# Patient Record
Sex: Male | Born: 1935 | Race: White | Hispanic: No | Smoking: Never smoker
Health system: Southern US, Community
[De-identification: ages and names within clinical notes are randomized; demographics above are authoritative.]

## PROBLEM LIST (undated history)

## (undated) DIAGNOSIS — E119 Type 2 diabetes mellitus without complications: Secondary | ICD-10-CM

## (undated) DIAGNOSIS — R0600 Dyspnea, unspecified: Secondary | ICD-10-CM

## (undated) DIAGNOSIS — I739 Peripheral vascular disease, unspecified: Secondary | ICD-10-CM

## (undated) DIAGNOSIS — E785 Hyperlipidemia, unspecified: Secondary | ICD-10-CM

## (undated) DIAGNOSIS — Z8739 Personal history of other diseases of the musculoskeletal system and connective tissue: Secondary | ICD-10-CM

## (undated) DIAGNOSIS — I2510 Atherosclerotic heart disease of native coronary artery without angina pectoris: Secondary | ICD-10-CM

## (undated) DIAGNOSIS — I209 Angina pectoris, unspecified: Secondary | ICD-10-CM

## (undated) DIAGNOSIS — I219 Acute myocardial infarction, unspecified: Secondary | ICD-10-CM

## (undated) DIAGNOSIS — K219 Gastro-esophageal reflux disease without esophagitis: Secondary | ICD-10-CM

## (undated) DIAGNOSIS — N183 Chronic kidney disease, stage 3 unspecified: Secondary | ICD-10-CM

## (undated) DIAGNOSIS — I10 Essential (primary) hypertension: Secondary | ICD-10-CM

---

## 2005-07-08 ENCOUNTER — Ambulatory Visit: Payer: Self-pay

## 2005-07-27 ENCOUNTER — Other Ambulatory Visit: Payer: Self-pay

## 2005-07-28 ENCOUNTER — Inpatient Hospital Stay: Payer: Self-pay | Admitting: Orthopaedic Surgery

## 2005-09-25 ENCOUNTER — Other Ambulatory Visit: Payer: Self-pay

## 2005-09-25 ENCOUNTER — Ambulatory Visit: Payer: Self-pay | Admitting: General Practice

## 2005-10-01 ENCOUNTER — Ambulatory Visit: Payer: Self-pay | Admitting: General Practice

## 2005-11-19 ENCOUNTER — Ambulatory Visit: Payer: Self-pay | Admitting: General Practice

## 2005-11-21 ENCOUNTER — Ambulatory Visit: Payer: Self-pay | Admitting: General Practice

## 2006-06-17 ENCOUNTER — Other Ambulatory Visit: Payer: Self-pay

## 2006-06-17 ENCOUNTER — Ambulatory Visit: Payer: Self-pay | Admitting: Urology

## 2006-06-24 ENCOUNTER — Ambulatory Visit: Payer: Self-pay | Admitting: Urology

## 2006-09-09 ENCOUNTER — Ambulatory Visit: Payer: Self-pay | Admitting: Urology

## 2006-12-09 ENCOUNTER — Other Ambulatory Visit: Payer: Self-pay

## 2006-12-09 ENCOUNTER — Ambulatory Visit: Payer: Self-pay | Admitting: Urology

## 2006-12-24 ENCOUNTER — Inpatient Hospital Stay: Payer: Self-pay | Admitting: Urology

## 2007-05-24 ENCOUNTER — Inpatient Hospital Stay: Payer: Self-pay | Admitting: Unknown Physician Specialty

## 2007-05-24 ENCOUNTER — Other Ambulatory Visit: Payer: Self-pay

## 2007-09-10 ENCOUNTER — Other Ambulatory Visit: Payer: Self-pay

## 2007-09-11 ENCOUNTER — Inpatient Hospital Stay: Payer: Self-pay | Admitting: Unknown Physician Specialty

## 2008-02-22 ENCOUNTER — Ambulatory Visit: Payer: Self-pay | Admitting: General Practice

## 2008-03-06 ENCOUNTER — Inpatient Hospital Stay: Payer: Self-pay | Admitting: General Practice

## 2008-09-10 IMAGING — CR DG ABDOMEN 2V
1 series · 2 of 2 positions shown · non-contrast
Comparison: none

REASON FOR EXAM: Abdominal distention
COMMENTS:

[Series 1: view not recorded · 0.17mm/px · 2 of 2 slices shown]
[im 1/2]
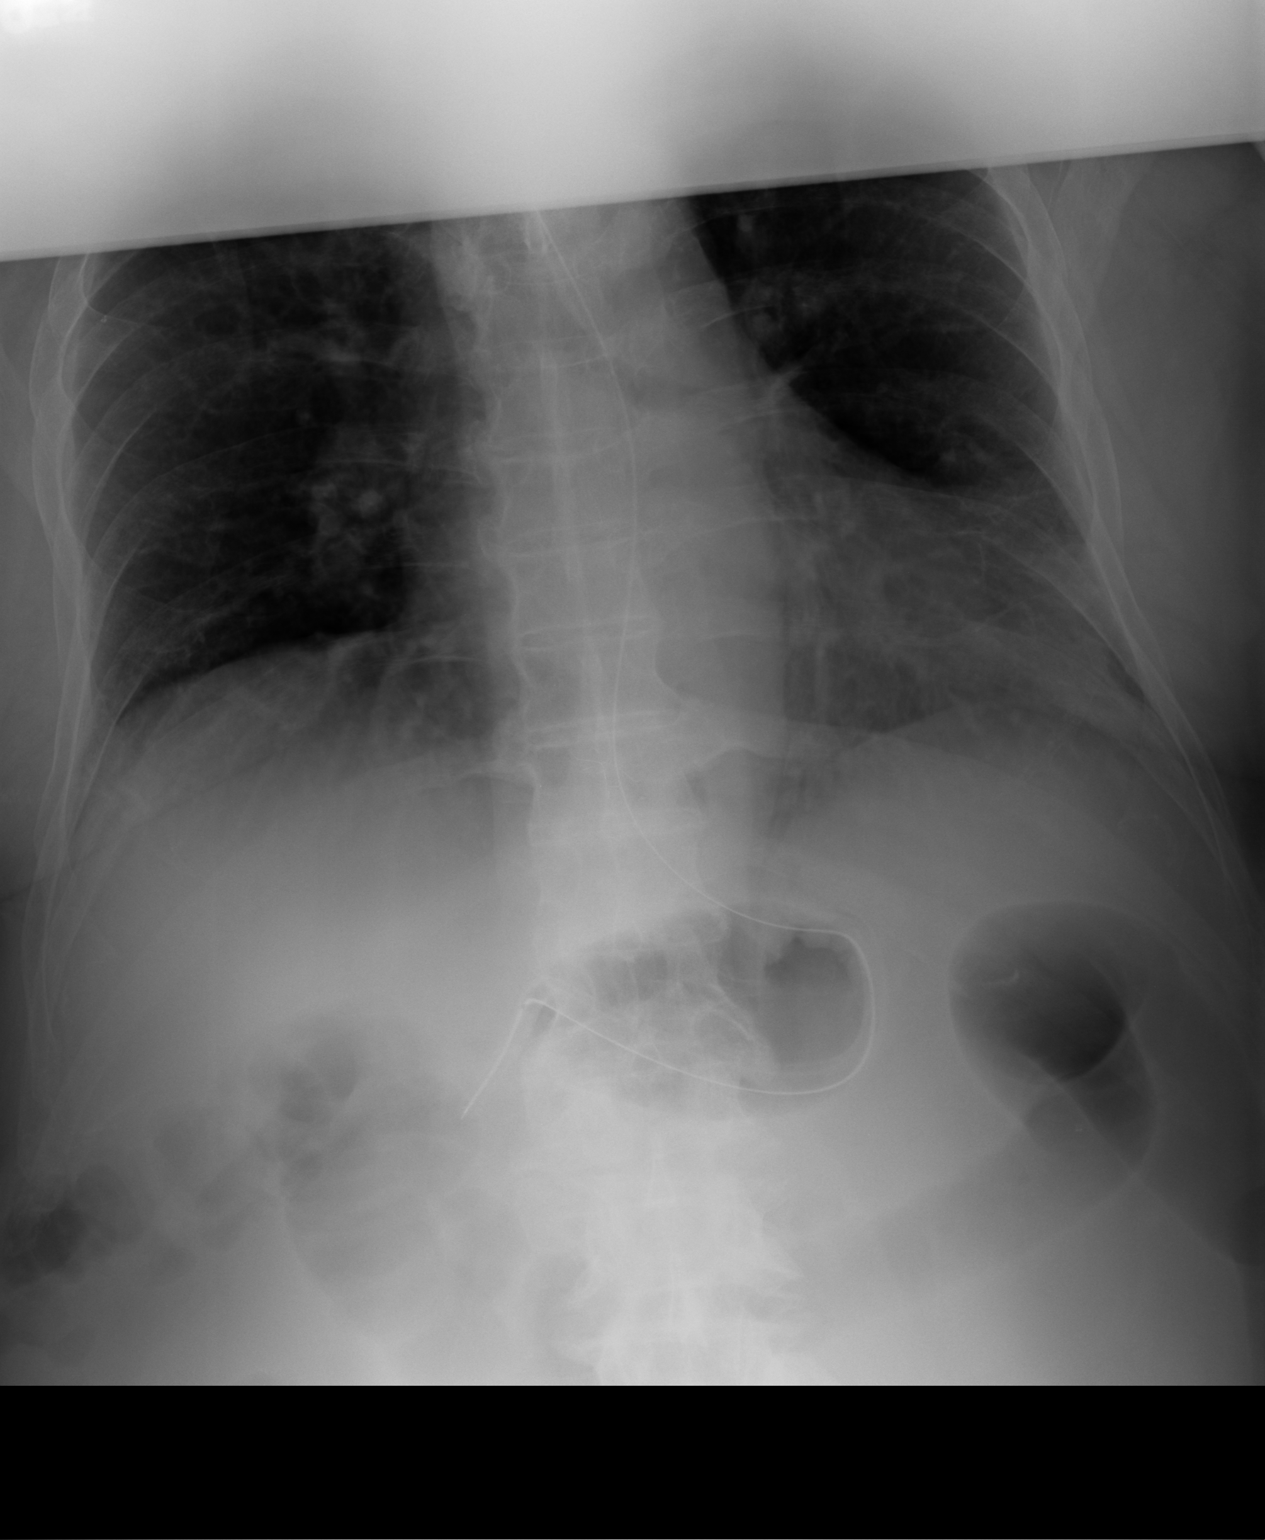
[im 2/2]
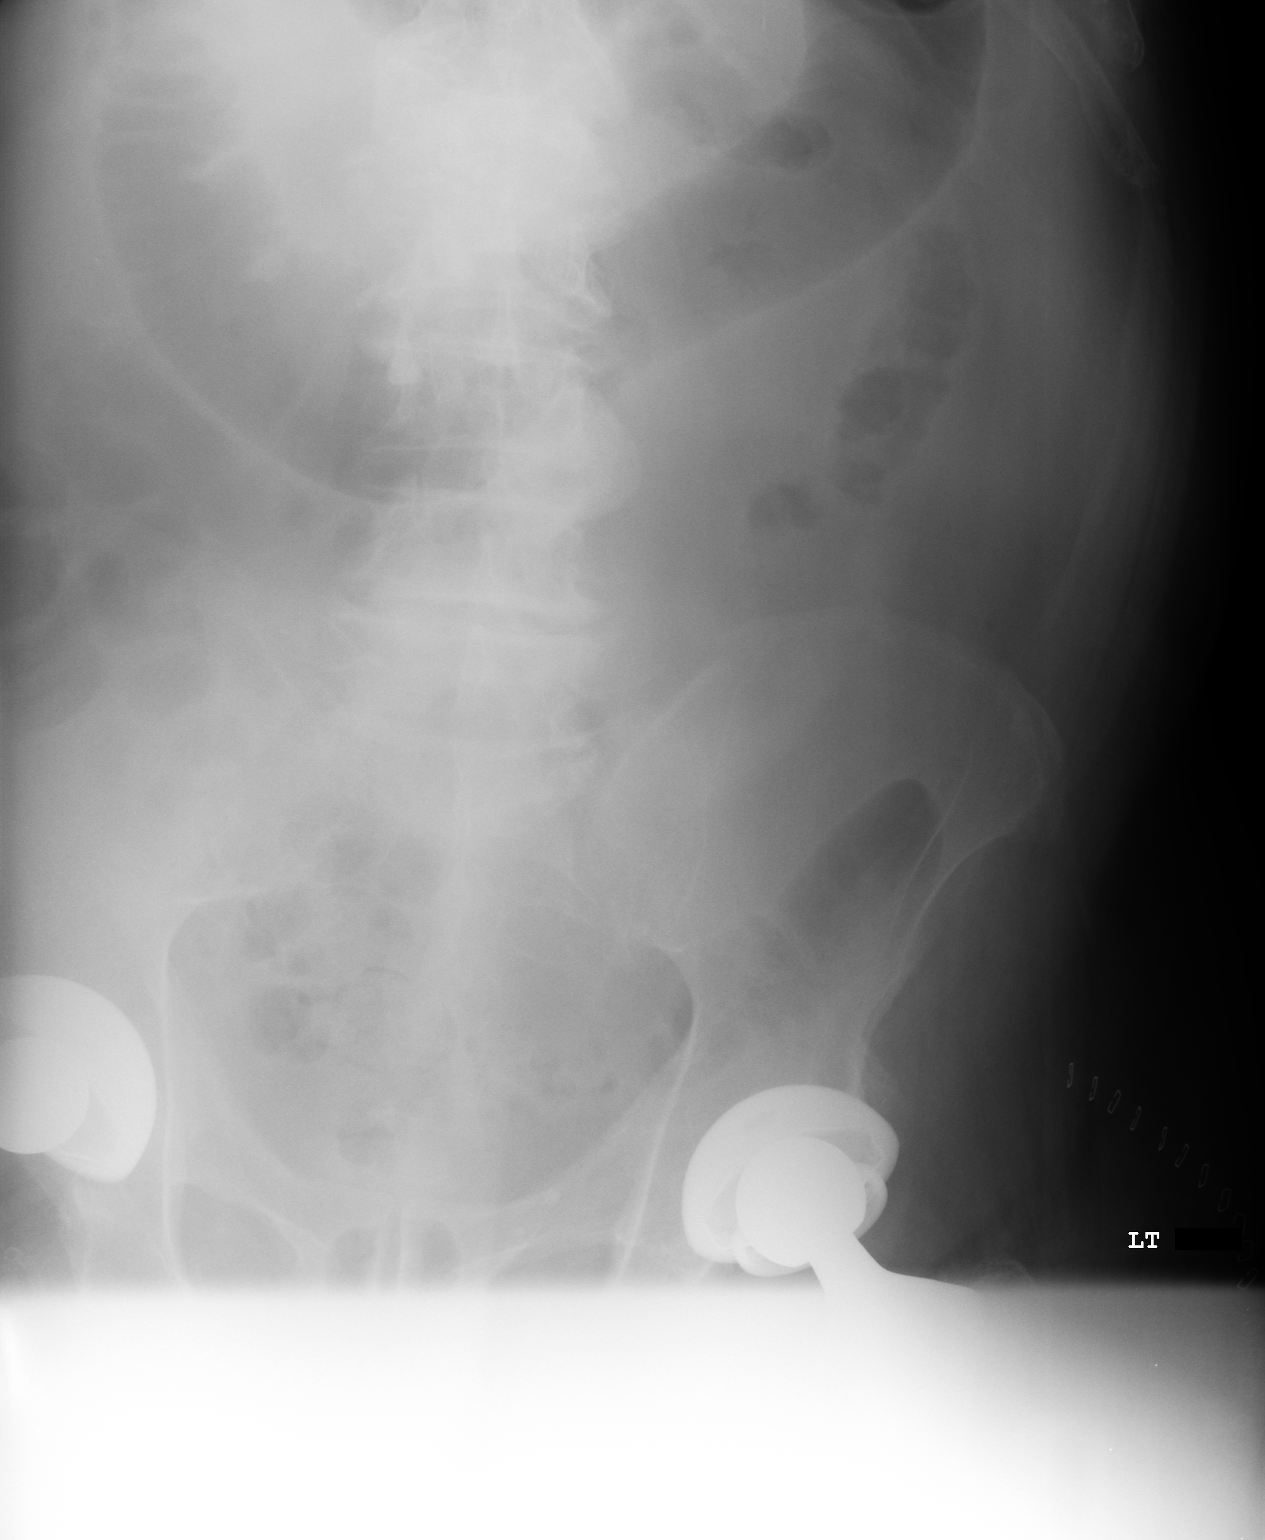

[2 of 2 positions shown; findings below may reference images not displayed]

PROCEDURE:     DXR - DXR ABDOMEN 2 V FLAT AND ERECT  - March 09, 2008  [DATE]

RESULT:     Comparison is made to abdominal films performed on 03/07/2008.

An esophagogastric tube is evident. The tip appears to be in the pyloric
region or first portion of the duodenum.  There is air within loops of small
bowel with mild distention. There is air within the colon without
significant distention. The lung bases are clear. Bilateral hip arthroplasty
changes are noted. There is no significant abnormal bowel distention
suggested.
IMPRESSION: Esophagogastric tube placed.

## 2013-02-17 LAB — BASIC METABOLIC PANEL
Anion Gap: 9 (ref 7–16)
Calcium, Total: 9.3 mg/dL (ref 8.5–10.1)
Chloride: 104 mmol/L (ref 98–107)
Creatinine: 1.41 mg/dL — ABNORMAL HIGH (ref 0.60–1.30)
Glucose: 91 mg/dL (ref 65–99)
Osmolality: 280 (ref 275–301)
Potassium: 5 mmol/L (ref 3.5–5.1)
Sodium: 139 mmol/L (ref 136–145)

## 2013-02-17 LAB — CBC
MCH: 32.1 pg (ref 26.0–34.0)
MCHC: 33.6 g/dL (ref 32.0–36.0)
MCV: 96 fL (ref 80–100)
Platelet: 185 10*3/uL (ref 150–440)
RDW: 13.9 % (ref 11.5–14.5)

## 2013-02-17 LAB — URINALYSIS, COMPLETE
Bilirubin,UR: NEGATIVE
Blood: NEGATIVE
Glucose,UR: NEGATIVE mg/dL (ref 0–75)
Nitrite: NEGATIVE
Ph: 6 (ref 4.5–8.0)
Protein: NEGATIVE
RBC,UR: 3 /HPF (ref 0–5)
Specific Gravity: 1.013 (ref 1.003–1.030)
Squamous Epithelial: <1
WBC UR: 5 /HPF (ref 0–5)

## 2013-02-18 ENCOUNTER — Inpatient Hospital Stay: Payer: Self-pay | Admitting: Internal Medicine

## 2013-02-19 ENCOUNTER — Ambulatory Visit: Payer: Self-pay | Admitting: Orthopedic Surgery

## 2013-02-22 ENCOUNTER — Encounter: Payer: Self-pay | Admitting: Internal Medicine

## 2013-03-02 LAB — URINALYSIS, COMPLETE
Glucose,UR: NEGATIVE mg/dL (ref 0–75)
Ketone: NEGATIVE
RBC,UR: 6 /HPF (ref 0–5)
Squamous Epithelial: 1
WBC UR: 338 /HPF (ref 0–5)

## 2013-03-04 LAB — URINE CULTURE

## 2013-03-08 ENCOUNTER — Encounter: Payer: Self-pay | Admitting: Internal Medicine

## 2013-03-14 ENCOUNTER — Ambulatory Visit: Payer: Self-pay | Admitting: Gerontology

## 2013-03-14 LAB — CBC WITH DIFFERENTIAL/PLATELET
Basophil #: 0 10*3/uL (ref 0.0–0.1)
Eosinophil #: 0.1 10*3/uL (ref 0.0–0.7)
Eosinophil %: 0.5 %
HCT: 35.5 % — ABNORMAL LOW (ref 40.0–52.0)
HGB: 11.9 g/dL — ABNORMAL LOW (ref 13.0–18.0)
Lymphocyte #: 0.7 10*3/uL — ABNORMAL LOW (ref 1.0–3.6)
Lymphocyte %: 4.3 %
MCV: 96 fL (ref 80–100)
Monocyte #: 0.7 x10 3/mm (ref 0.2–1.0)
Monocyte %: 4.5 %
Neutrophil %: 90.4 %

## 2013-03-14 LAB — COMPREHENSIVE METABOLIC PANEL
Albumin: 3.3 g/dL — ABNORMAL LOW (ref 3.4–5.0)
Alkaline Phosphatase: 59 U/L (ref 50–136)
Anion Gap: 5 — ABNORMAL LOW (ref 7–16)
Bilirubin,Total: 0.3 mg/dL (ref 0.2–1.0)
Calcium, Total: 8.8 mg/dL (ref 8.5–10.1)
Chloride: 106 mmol/L (ref 98–107)
Creatinine: 2.44 mg/dL — ABNORMAL HIGH (ref 0.60–1.30)
EGFR (Non-African Amer.): 22 — ABNORMAL LOW
Glucose: 142 mg/dL — ABNORMAL HIGH (ref 65–99)
Osmolality: 284 (ref 275–301)
Potassium: 6.4 mmol/L — ABNORMAL HIGH (ref 3.5–5.1)
SGPT (ALT): 20 U/L (ref 12–78)
Sodium: 136 mmol/L (ref 136–145)

## 2013-03-14 LAB — MAGNESIUM: Magnesium: 1.8 mg/dL

## 2013-03-15 LAB — BASIC METABOLIC PANEL
Anion Gap: 5 — ABNORMAL LOW (ref 7–16)
BUN: 31 mg/dL — ABNORMAL HIGH (ref 7–18)
Co2: 21 mmol/L (ref 21–32)
Creatinine: 1.33 mg/dL — ABNORMAL HIGH (ref 0.60–1.30)
EGFR (Non-African Amer.): 46 — ABNORMAL LOW
Osmolality: 280 (ref 275–301)
Potassium: 6.3 mmol/L — ABNORMAL HIGH (ref 3.5–5.1)

## 2013-03-16 LAB — BASIC METABOLIC PANEL
Anion Gap: 8 (ref 7–16)
Calcium, Total: 8.7 mg/dL (ref 8.5–10.1)
Co2: 25 mmol/L (ref 21–32)
EGFR (African American): >60
EGFR (Non-African Amer.): 53 — ABNORMAL LOW
Glucose: 107 mg/dL — ABNORMAL HIGH (ref 65–99)
Sodium: 142 mmol/L (ref 136–145)

## 2013-03-16 LAB — FOLATE: Folic Acid: 15.6 ng/mL (ref 3.1–100.0)

## 2013-03-18 LAB — BASIC METABOLIC PANEL
Anion Gap: 8 (ref 7–16)
Calcium, Total: 8.8 mg/dL (ref 8.5–10.1)
Chloride: 107 mmol/L (ref 98–107)
EGFR (African American): >60
EGFR (Non-African Amer.): >60

## 2013-03-22 LAB — BASIC METABOLIC PANEL
Anion Gap: 7 (ref 7–16)
Calcium, Total: 9.1 mg/dL (ref 8.5–10.1)
Creatinine: 1.59 mg/dL — ABNORMAL HIGH (ref 0.60–1.30)
EGFR (African American): 43 — ABNORMAL LOW
EGFR (Non-African Amer.): 37 — ABNORMAL LOW
Glucose: 167 mg/dL — ABNORMAL HIGH (ref 65–99)
Potassium: 4 mmol/L (ref 3.5–5.1)

## 2013-04-08 ENCOUNTER — Encounter: Payer: Self-pay | Admitting: Internal Medicine

## 2013-10-02 LAB — COMPREHENSIVE METABOLIC PANEL
ALT: 15 U/L (ref 12–78)
Albumin: 3.3 g/dL — ABNORMAL LOW (ref 3.4–5.0)
Alkaline Phosphatase: 61 U/L
Anion Gap: 4 — ABNORMAL LOW (ref 7–16)
BILIRUBIN TOTAL: 0.2 mg/dL (ref 0.2–1.0)
BUN: 24 mg/dL — AB (ref 7–18)
CALCIUM: 8.4 mg/dL — AB (ref 8.5–10.1)
Chloride: 108 mmol/L — ABNORMAL HIGH (ref 98–107)
Co2: 26 mmol/L (ref 21–32)
Creatinine: 1.4 mg/dL — ABNORMAL HIGH (ref 0.60–1.30)
EGFR (African American): 50 — ABNORMAL LOW
GFR CALC NON AF AMER: 43 — AB
GLUCOSE: 128 mg/dL — AB (ref 65–99)
Osmolality: 281 (ref 275–301)
Potassium: 4 mmol/L (ref 3.5–5.1)
SGOT(AST): 17 U/L (ref 15–37)
Sodium: 138 mmol/L (ref 136–145)
Total Protein: 7.2 g/dL (ref 6.4–8.2)

## 2013-10-02 LAB — CBC
HCT: 37.9 % — AB (ref 40.0–52.0)
HGB: 12.3 g/dL — ABNORMAL LOW (ref 13.0–18.0)
MCH: 30.1 pg (ref 26.0–34.0)
MCHC: 32.5 g/dL (ref 32.0–36.0)
MCV: 93 fL (ref 80–100)
PLATELETS: 172 10*3/uL (ref 150–440)
RBC: 4.09 10*6/uL — ABNORMAL LOW (ref 4.40–5.90)
RDW: 14.9 % — AB (ref 11.5–14.5)
WBC: 9.5 10*3/uL (ref 3.8–10.6)

## 2013-10-02 LAB — TROPONIN I

## 2013-10-03 ENCOUNTER — Inpatient Hospital Stay: Payer: Self-pay | Admitting: Internal Medicine

## 2013-10-03 LAB — URINALYSIS, COMPLETE
Bilirubin,UR: NEGATIVE
GLUCOSE, UR: NEGATIVE mg/dL (ref 0–75)
Ketone: NEGATIVE
Nitrite: NEGATIVE
PH: 5 (ref 4.5–8.0)
Protein: 30
RBC,UR: 9 /HPF (ref 0–5)
SPECIFIC GRAVITY: 1.019 (ref 1.003–1.030)

## 2013-10-04 LAB — CBC WITH DIFFERENTIAL/PLATELET
BASOS ABS: 0 10*3/uL (ref 0.0–0.1)
BASOS ABS: 0 10*3/uL (ref 0.0–0.1)
BASOS PCT: 0.3 %
BASOS PCT: 0.5 %
EOS PCT: 2.4 %
Eosinophil #: 0.2 10*3/uL (ref 0.0–0.7)
Eosinophil #: 0.2 10*3/uL (ref 0.0–0.7)
Eosinophil %: 2.1 %
HCT: 38.1 % — ABNORMAL LOW (ref 40.0–52.0)
HCT: 36.4 % — AB (ref 40.0–52.0)
HGB: 12.3 g/dL — AB (ref 13.0–18.0)
HGB: 11.9 g/dL — ABNORMAL LOW (ref 13.0–18.0)
LYMPHS PCT: 11 %
Lymphocyte #: 0.6 10*3/uL — ABNORMAL LOW (ref 1.0–3.6)
Lymphocyte #: 1 10*3/uL (ref 1.0–3.6)
Lymphocyte %: 5.9 %
MCH: 30 pg (ref 26.0–34.0)
MCH: 30.6 pg (ref 26.0–34.0)
MCHC: 32.2 g/dL (ref 32.0–36.0)
MCHC: 32.8 g/dL (ref 32.0–36.0)
MCV: 93 fL (ref 80–100)
MCV: 93 fL (ref 80–100)
MONO ABS: 0.7 x10 3/mm (ref 0.2–1.0)
MONO ABS: 0.6 x10 3/mm (ref 0.2–1.0)
Monocyte %: 6.5 %
Monocyte %: 6.3 %
NEUTROS ABS: 9.3 10*3/uL — AB (ref 1.4–6.5)
NEUTROS ABS: 7.1 10*3/uL — AB (ref 1.4–6.5)
Neutrophil %: 85.2 %
Neutrophil %: 79.8 %
PLATELETS: 157 10*3/uL (ref 150–440)
Platelet: 144 10*3/uL — ABNORMAL LOW (ref 150–440)
RBC: 4.08 10*6/uL — ABNORMAL LOW (ref 4.40–5.90)
RBC: 3.9 10*6/uL — AB (ref 4.40–5.90)
RDW: 15.2 % — AB (ref 11.5–14.5)
RDW: 15.2 % — AB (ref 11.5–14.5)
WBC: 10.9 10*3/uL — ABNORMAL HIGH (ref 3.8–10.6)
WBC: 8.9 10*3/uL (ref 3.8–10.6)

## 2013-10-04 LAB — BASIC METABOLIC PANEL
ANION GAP: 4 — AB (ref 7–16)
BUN: 24 mg/dL — ABNORMAL HIGH (ref 7–18)
CALCIUM: 8.4 mg/dL — AB (ref 8.5–10.1)
CREATININE: 1.47 mg/dL — AB (ref 0.60–1.30)
Chloride: 105 mmol/L (ref 98–107)
Co2: 24 mmol/L (ref 21–32)
EGFR (Non-African Amer.): 41 — ABNORMAL LOW
GFR CALC AF AMER: 47 — AB
Glucose: 163 mg/dL — ABNORMAL HIGH (ref 65–99)
Osmolality: 274 (ref 275–301)
Potassium: 4.5 mmol/L (ref 3.5–5.1)
Sodium: 133 mmol/L — ABNORMAL LOW (ref 136–145)

## 2013-10-05 LAB — BASIC METABOLIC PANEL
ANION GAP: 6 — AB (ref 7–16)
BUN: 16 mg/dL (ref 7–18)
CO2: 26 mmol/L (ref 21–32)
Calcium, Total: 8.2 mg/dL — ABNORMAL LOW (ref 8.5–10.1)
Chloride: 106 mmol/L (ref 98–107)
Creatinine: 1.07 mg/dL (ref 0.60–1.30)
EGFR (Non-African Amer.): 60 — ABNORMAL LOW
GLUCOSE: 106 mg/dL — AB (ref 65–99)
Osmolality: 277 (ref 275–301)
Potassium: 4.4 mmol/L (ref 3.5–5.1)
SODIUM: 138 mmol/L (ref 136–145)

## 2013-10-05 LAB — CBC WITH DIFFERENTIAL/PLATELET
Basophil #: 0 10*3/uL (ref 0.0–0.1)
Basophil %: 0.6 %
EOS ABS: 0.4 10*3/uL (ref 0.0–0.7)
EOS PCT: 5.7 %
HCT: 33.7 % — ABNORMAL LOW (ref 40.0–52.0)
HGB: 11.1 g/dL — ABNORMAL LOW (ref 13.0–18.0)
Lymphocyte #: 0.8 10*3/uL — ABNORMAL LOW (ref 1.0–3.6)
Lymphocyte %: 11.5 %
MCH: 30.5 pg (ref 26.0–34.0)
MCHC: 33 g/dL (ref 32.0–36.0)
MCV: 93 fL (ref 80–100)
MONOS PCT: 10.3 %
Monocyte #: 0.7 x10 3/mm (ref 0.2–1.0)
Neutrophil #: 5.2 10*3/uL (ref 1.4–6.5)
Neutrophil %: 71.9 %
PLATELETS: 135 10*3/uL — AB (ref 150–440)
RBC: 3.64 10*6/uL — AB (ref 4.40–5.90)
RDW: 15.2 % — AB (ref 11.5–14.5)
WBC: 7.3 10*3/uL (ref 3.8–10.6)

## 2013-10-05 LAB — URINE CULTURE

## 2013-10-10 ENCOUNTER — Telehealth: Payer: Self-pay | Admitting: Family Medicine

## 2013-10-10 ENCOUNTER — Emergency Department: Payer: Self-pay | Admitting: Emergency Medicine

## 2013-10-10 LAB — CBC
HCT: 35.7 % — AB (ref 40.0–52.0)
HGB: 11.7 g/dL — ABNORMAL LOW (ref 13.0–18.0)
MCH: 30.2 pg (ref 26.0–34.0)
MCHC: 32.8 g/dL (ref 32.0–36.0)
MCV: 92 fL (ref 80–100)
PLATELETS: 198 10*3/uL (ref 150–440)
RBC: 3.88 10*6/uL — ABNORMAL LOW (ref 4.40–5.90)
RDW: 15.6 % — AB (ref 11.5–14.5)
WBC: 7.8 10*3/uL (ref 3.8–10.6)

## 2013-10-10 LAB — TROPONIN I: Troponin-I: <0.02 ng/mL

## 2013-10-10 LAB — BASIC METABOLIC PANEL
Anion Gap: 8 (ref 7–16)
BUN: 26 mg/dL — ABNORMAL HIGH (ref 7–18)
CO2: 26 mmol/L (ref 21–32)
Calcium, Total: 8.9 mg/dL (ref 8.5–10.1)
Chloride: 100 mmol/L (ref 98–107)
Creatinine: 1.46 mg/dL — ABNORMAL HIGH (ref 0.60–1.30)
EGFR (African American): 48 — ABNORMAL LOW
EGFR (Non-African Amer.): 41 — ABNORMAL LOW
Glucose: 138 mg/dL — ABNORMAL HIGH (ref 65–99)
OSMOLALITY: 275 (ref 275–301)
Potassium: 4.1 mmol/L (ref 3.5–5.1)
Sodium: 134 mmol/L — ABNORMAL LOW (ref 136–145)

## 2013-10-10 NOTE — Telephone Encounter (Signed)
noted 

## 2013-10-10 NOTE — Telephone Encounter (Signed)
[facility] Triage Call Report Triage Record Num: [id number] Operator: Judeen Hammansracie Rowe Patient Name: Timothy QuintOscar Hays Call Date & Time: 10/10/2013 2:53:59AM Patient Phone: [phone] PCP: Tillman Abideichard Letvak Patient Gender: Male PCP Fax : [phone] Patient DOB: 06/26/1919 [facility] Name[facility] [place] Reason for Call: Caller: Kathy/LPN; PCP: Tillman AbideLetvak , Richard (Family Practice); CB#: [phone]; Call regarding Chest pain; Onset 2/2. Has been present x 5 or more minutes and is present regardless of activity. All emergent sxs ruled out per Chest Pain Guideline except for "Pressure, fullness, squeezing sensation or pain anywhere in the chest lasting 5 or more minutes now or within the last hour...." Advised to hang up and call EMS per disposition. LPN agrees. Protocol(s) Used: Chest Pain Recommended Outcome per Protocol: Activate EMS 911 Reason for Outcome: Pressure, fullness, squeezing sensation or pain anywhere in the chest lasting 5 or more minutes now or within the last hour. Pain is NOT associated with taking a deep breath or a productive cough, movement, or touch to a localized area on the chest. Care Advice: ~ 10/10/2013 2:58:39AM Page 1 of 1 [id number]

## 2013-10-12 DIAGNOSIS — I10 Essential (primary) hypertension: Secondary | ICD-10-CM

## 2013-10-12 DIAGNOSIS — Z7982 Long term (current) use of aspirin: Secondary | ICD-10-CM

## 2013-10-12 DIAGNOSIS — N390 Urinary tract infection, site not specified: Secondary | ICD-10-CM

## 2013-10-12 DIAGNOSIS — E119 Type 2 diabetes mellitus without complications: Secondary | ICD-10-CM

## 2013-10-12 DIAGNOSIS — M25559 Pain in unspecified hip: Secondary | ICD-10-CM

## 2013-11-08 DIAGNOSIS — R609 Edema, unspecified: Secondary | ICD-10-CM

## 2013-11-08 DIAGNOSIS — I739 Peripheral vascular disease, unspecified: Secondary | ICD-10-CM

## 2013-11-22 ENCOUNTER — Emergency Department: Payer: Self-pay | Admitting: Emergency Medicine

## 2014-02-06 ENCOUNTER — Ambulatory Visit: Payer: Self-pay | Admitting: Family Medicine

## 2014-10-21 ENCOUNTER — Emergency Department: Payer: Self-pay | Admitting: Internal Medicine

## 2014-10-26 ENCOUNTER — Inpatient Hospital Stay: Payer: Self-pay | Admitting: Internal Medicine

## 2014-10-26 DIAGNOSIS — R079 Chest pain, unspecified: Secondary | ICD-10-CM

## 2014-10-31 DIAGNOSIS — S92344A Nondisplaced fracture of fourth metatarsal bone, right foot, initial encounter for closed fracture: Secondary | ICD-10-CM

## 2014-10-31 DIAGNOSIS — I2510 Atherosclerotic heart disease of native coronary artery without angina pectoris: Secondary | ICD-10-CM

## 2014-10-31 DIAGNOSIS — S92345A Nondisplaced fracture of fourth metatarsal bone, left foot, initial encounter for closed fracture: Secondary | ICD-10-CM

## 2014-10-31 DIAGNOSIS — E119 Type 2 diabetes mellitus without complications: Secondary | ICD-10-CM

## 2014-10-31 DIAGNOSIS — K209 Esophagitis, unspecified: Secondary | ICD-10-CM

## 2014-10-31 DIAGNOSIS — I10 Essential (primary) hypertension: Secondary | ICD-10-CM

## 2014-11-20 DIAGNOSIS — J40 Bronchitis, not specified as acute or chronic: Secondary | ICD-10-CM | POA: Diagnosis not present

## 2014-11-20 DIAGNOSIS — K2210 Ulcer of esophagus without bleeding: Secondary | ICD-10-CM

## 2014-11-20 DIAGNOSIS — I10 Essential (primary) hypertension: Secondary | ICD-10-CM

## 2014-11-20 DIAGNOSIS — E119 Type 2 diabetes mellitus without complications: Secondary | ICD-10-CM

## 2014-11-20 DIAGNOSIS — I2510 Atherosclerotic heart disease of native coronary artery without angina pectoris: Secondary | ICD-10-CM

## 2014-12-29 NOTE — Consult Note (Signed)
PATIENT NAME:  Hays, [name] MR#:  [id number] DATE OF BIRTH:  May 27, 1919  DATE OF CONSULTATION:  02/19/2013  REFERRING PHCarolina CellarYSICIAN:   CONSULTING PHYSICIAN:  Danelle Earthlyobin T. Shawntrice Salle, MD  HISTORY OF PRESENT ILLNESS:  Mr. Timothy Hays is an otherwise relatively healthy 81 year old male with past history of bilateral total hip replacements as well as coronary artery disease and gout who was admitted to the hospital yesterday after sustaining a fall Wednesday with right thoracic pain as well as left thigh pain and inability to bear weight on his left lower extremity.  His initial radiographs of the pelvis and left femur showed a stable bilateral total hip replacements without any evidence of fracture-dislocation.  Given his inability to bear weight on the left side a CT scan was ordered and orthopedics was consulted.  Upon talking to Mr. Timothy Hays, he states he has had what he describes as occasional intermittent flinching in his left lower extremity with ambulation over the past couple of months, he says was acutely worsened after his fall a few days ago where he now has pain in his thigh with attempted walking and now is unable to bear weight.   PAST MEDICAL HISTORY:  As above.   PHYSICAL EXAMINATION:  The patient is alert and oriented, appropriately responsive in bed.  His right lower extremity is painless to knee flexion-extension as well as internal/external rotation of his hip.  He did have some tenderness with lateral palpation of his mid femur and states that that is where he gets pain when he attempts to ambulate.   RADIOGRAPHS:  X-rays again demonstrate stable femoral prosthesis without any evidence of periprosthetic fracture.  CT scan was performed with significant metal artifact making it difficult to interpret, but seen on a series of slices on the axial images of what appears to be a posterior cortical irregularity which again could be representative of a periprosthetic fracture as well as a potential for osteoid  osteoma as pointed out by radiology.  This irregularity is not seen on the sagittal or coronal reconstructions.   ASSESSMENT:  A 81 year old with presumed periprosthetic femoral shaft fracture.  His clinical picture does not correlate with that of an osteoid osteoma as he denies any night pain and again his pain seems to be pretty focal since his fall.  Then given the location of this cortical irregularity, the femoral stem appears to be at least two cortical diameters distal to the extent of this irregularity meaning this his prosthesis is likely stable and would not warrant further surgical intervention at this time.  This patient is well-known to Dr. Ernest PineHooten who has done both his hip replacements.  I will discuss with him if there is any further intervention or radiographic tests he would like to have ordered.   ____________________________ Danelle Earthlyobin T. Elona Yinger, MD tte:ea D: 02/19/2013 18:03:15 ET T: 02/20/2013 04:49:02 ET JOB#: [id number]  cc: Danelle Earthlyobin T. Cannen Dupras, MD, <Dictator> Danelle EarthlyBIN T Azhar Knope MD ELECTRONICALLY SIGNED 02/20/2013 8:57

## 2014-12-29 NOTE — Consult Note (Signed)
Brief Consult Note: Diagnosis: Left non-displaced periprosthetic femur fracture.   Patient was seen by consultant.   Consult note dictated.   Comments: Patient reports prodromal intermittent pain in left thigh over the past two months that was acutely exacerabted after his fall 4 days ago now with an inabillity to bear weight secondary to left thigh pain. He has no pain with knee or hip motion. Xrays negative for fracture and CT scan obtained is suggestive of a posterior cortical irregularity seen on the axial images, but difficult to interpret secondary to metal artifact. The femoral stem extends well below the area of concern so if this is a fracture, it should be adequately stabilized by his current prosthesis.  Electronic Signatures: Danelle EarthlyEckel, Tobin T (MD)  (Signed 14-Jun-14 17:58)  Authored: Brief Consult Note   Last Updated: 14-Jun-14 17:58 by Danelle EarthlyEckel, Tobin T (MD)

## 2014-12-29 NOTE — Discharge Summary (Signed)
PATIENT NAME:  Timothy Hays, Timothy Hays MR#:  [id number] DATE OF BIRTH:  30-Jun-1919  DATE OF ADMISSION:  02/17/2013 DATE OF DISCHARGE:  02/21/2013  ADMISSION DIAGNOSIS: Left leg pain with ambulatory dysfunction.   DISCHARGE DIAGNOSES: 1.  Left leg pain with ambulatory dysfunction.  2.  Hypertension.  3.  Hyperlipidemia.  4.  Diabetes. 5.  Gastroesophageal reflux disease.  6.  History of chronic kidney disease.   CONSULTS: Dr. Ernest PineHooten.   PERTINENT LABORATORIES AT DISCHARGE:  Lumbar x-ray showed degenerative disk disease. Right shoulder showed degenerative changes in the shoulder joint without fracture.  Subcortical lucency noted along the posterior proximal femur. No acute abnormality.  Discharge white blood cells 7, hemoglobin 12.5, hematocrit 35, platelets are 185.  Sodium 139, potassium 5.0, chloride 104, bicarbonate 26, BUN 22, creatinine 1.41, glucose is 91.   HOSPITAL COURSE: A 81 year old male who presented after a fall with left hip pain and ambulatory dysfunction. For further details, please refer to the history and physical.   1.  Left hip pain and difficulty ambulating:  His CT showed subtle lucency posterior proximal femur.  I obtained an orthopedic consult. There was no acute fracture noted.  He  may have a periprosthetic femur fracture, but the femoral stem extends well below the area concerned, so if it was a fracture it should be adequately stabilized by his current prosthesis.  They did not actually at the end feel like there was an acute fracture. He is also complaining of shoulder pain and back pain, so we did some x-rays which basically showed some degenerative disk disease. He will be on a steroid taper for this.  2.  Hypertension:  The patient will continue his outpatient medications.   3.  Hyperlipidemia:  On Pravachol.  4.  Diabetes: The patient should be placed on sliding scale insulin per the facility. He will continue metformin, ADA diet.  5.  Gastroesophageal reflux  disease:  On Prilosec.  6.  History of chronic kidney disease:  There were no issues.   DISCHARGE MEDICATIONS: 1.  Prednisone taper starting at 50 mg, taper x 10 mg every 2 days.  2.  Metformin 1000 b.i.d.  3.  Omeprazole 20 mg daily.  4.  Lisinopril 10 mg daily.  5.  Pravastatin 80 mg at bedtime.  6.  Aspirin 81 mg daily.  7.  Ibuprofen 600 mg q. 8 hours p.r.n. pain.   DISCHARGE DIET: Low fat, low cholesterol, ADA diet.   DISCHARGE ACTIVITY: As tolerated.   FOLLOWUP:  The patient will need physical therapy at the rehab, and he will need followup with Dr. Ernest PineHooten in about 2 to 4 weeks.    TIME SPENT: Approximately 35 minutes.   CONDITION:  The patient is medically stable for discharge.   ____________________________ Junie Engram P. Juliene PinaMody, MD spm:cb D: 02/21/2013 12:47:36 ET Hays: 02/21/2013 14:47:54 ET JOB#: [id number]  cc: Loella Hickle P. Juliene PinaMody, MD, <Dictator> Illene LabradorJames P. Angie FavaHooten Jr., MD Janyth ContesSITAL P Jamere Stidham MD ELECTRONICALLY SIGNED 02/21/2013 20:47

## 2014-12-29 NOTE — H&P (Signed)
PATIENT NAME:  Timothy Hays, Timothy Hays MR#:  [id number] DATE OF BIRTH:  Sep 17, 1918  DATE OF ADMISSION:  02/17/2013  PRIMARY CARE PHYSICIAN: [facility].   CHIEF COMPLAINT: Status post fall and left hip pain.   HISTORY OF PRESENT ILLNESS: This is a 81 year old male who presents to the hospital after having 2 mechanical falls yesterday at home. The patient said he was not having any chest pain, any palpitations or any dizziness prior to fall. His left leg just gave way and he fell. He has been having some difficulty ambulating and having some left hip pain and therefore came to the hospital for further evaluation. In the ER, the patient underwent x-rays of his left hip and pelvis which showed no evidence of acute fracture, but despite getting IV pain meds, the patient has not been able to bear any weight on that leg. Hospitalist services were contacted for further treatment and evaluation.   REVIEW OF SYSTEMS:  CONSTITUTIONAL: No documented fever. No weight gain or weight loss.  EYES: No blurred or double vision.  ENT: No tinnitus. No postnasal drip. No redness of the oropharynx.  RESPIRATORY: No cough, no wheeze. No hemoptysis.  CARDIOVASCULAR: No chest pain, no orthopnea, no palpitations or syncope.  GASTROINTESTINAL: No nausea, no vomiting, no diarrhea. No abdominal pain, no melena or hematochezia.  GENITOURINARY: No dysuria, no hematuria.  ENDOCRINE: No polyuria or nocturia. No heat or cold intolerance.  HEMATOLOGIC: No anemia, no bruising. No bleeding.  INTEGUMENTARY: No rashes. No lesions.  MUSCULOSKELETAL: No arthritis. No swelling. No gout.  NEUROLOGIC: No numbness or tingling. No ataxia. No seizure activity.  PSYCHIATRIC: No anxiety, no insomnia. No ADD.   PAST MEDICAL HISTORY: Consistent with diabetes, hypertension, hyperlipidemia, history of bilateral hip replacement, history of previous MRI and coronary disease.   ALLERGIES: PENICILLIN, MORPHINE; PENICILLIN CAUSES ANAPHYLAXIS.   SOCIAL  HISTORY: No smoking. No alcohol abuse. No illicit drug abuse. Lives at home by himself,  ambulates with the help of a walker.   FAMILY HISTORY: Both mother and father died from complications of heart disease. Mother was also diabetic.   CURRENT MEDICATIONS: Aspirin 81 mg daily, ibuprofen 600 mg q. 8 hours as needed, lisinopril 10 mg daily, metformin 1000 mg b.i.d., omeprazole 20 mg daily, Pravachol 40 mg 2 tabs at bedtime.   PHYSICAL EXAMINATION: On admission is as follows:  VITAL SIGNS:  Are noted to be: Temperature is 97.9, pulse 78, respirations 21, blood pressure 185/79, sats 97% on room air.  GENERAL: The patient is a pleasant-appearing male in no apparent distress.  HEENT: He is atraumatic, normocephalic. His extraocular muscles are intact. Pupils are equal and reactive to light. Sclerae anicteric. No conjunctival injection. No pharyngeal erythema.  NECK: Supple. There is no jugular venous distention. No bruits, no lymphadenopathy, no thyromegaly.  HEART:  Regular rate and rhythm. No murmurs. No rubs. No clicks.  LUNGS: Clear to auscultation bilaterally. No rales or rhonchi. No wheezes.  ABDOMEN: Soft, flat, nontender, nondistended. Has good bowel sounds. No hepatosplenomegaly appreciated.  EXTREMITIES: No evidence of any cyanosis, clubbing or peripheral edema. Has +2 pedal and radial pulses bilaterally.  NEUROLOGICAL: The patient is alert, awake, and oriented x 3 with no focal motor or sensory deficits appreciated bilaterally.  SKIN: Moist and warm with no rash appreciated.  LYMPHATIC: There is no cervical or axillary lymphadenopathy.   LABORATORY, DIAGNOSTIC AND RADIOLOGIC DATA:  Serum glucose 91, BUN 22, creatinine 1.4, sodium 139, potassium 5.0, chloride 104, bicarbonate 26. The patient's LFTs  are within normal limits. White cell count 7.1, hemoglobin 12.5, hematocrit 37.0 and platelet count 185. Urinalysis is within normal limits.   The patient did have an x-ray of the pelvis done,  which showed no acute fracture.   X-ray of the left femur showing no acute fracture.   He also had x-rays of the right ribs showing fractures of the right lateral eighth and ninth ribs.   ASSESSMENT AND PLAN: This is a 81 year old male with a history of diabetes, hypertension, hyperlipidemia, history of coronary artery disease, history of bilateral hip replacements, who presents to the hospital after suffering 2 falls and having difficulty ambulating.  1.  Left hip pain and difficulty ambulating. This is likely secondary to his mechanical fall and a hip contusion. His imaging studies do not show any evidence of acute fracture. For now, we will admit him for pain control with IV morphine. We will get a physical therapy consult to assess his mobility, if needed. We will consider orthopedics consult if his pain is not improving. The patient has been seen by Dr. Ernest PineHooten in the past.  2.  Hypertension. The patient is hemodynamically stable. Continue with lisinopril.  3.  Hyperlipidemia. Continue Pravachol.  4.  Diabetes. Continue with a carb -controlled diet and metformin.  5.  Gastroesophageal reflux disease. Continue with Prilosec.  6.  History of chronic kidney disease. Creatinine is at baseline and will continue to monitor this. No acute issue related to this at this time.   CODE STATUS: The patient is a full code.   TIME SPENT ON ADMISSION: 50 minutes    ____________________________ Rolly PancakeVivek J. Cherlynn KaiserSainani, MD vjs:cc D: 02/17/2013 16:29:25 ET T: 02/17/2013 17:16:34 ET JOB#: [id number]  cc: Rolly PancakeVivek J. Cherlynn KaiserSainani, MD, <Dictator> Houston SirenVIVEK J SAINANI MD ELECTRONICALLY SIGNED 02/25/2013 20:39

## 2014-12-30 NOTE — Consult Note (Signed)
PATIENT NAME:  Timothy Hays, Timothy Hays MR#:  [id number] DATE OF BIRTH:  02-11-1919  DATE OF CONSULTATION:  10/03/2013  REQUESTING PHYSICIAN:  Dr. Luberta MutterKonidena    CONSULTING PHYSICIAN:  Illene LabradorJames P. Angie FavaHooten Jr., MD  CHIEF COMPLAINT: Left hip pain.   HISTORY OF PRESENT ILLNESS: The patient is a 81 year old gentleman who apparently fell at home while walking down the hallway earlier today, and landed on his left hip and side. He has apparently had several falls recently. The patient denies any loss of consciousness. He apparently felt weak and lost his balance. He presented to the Emergency Room complaining of lateral left hip pain and difficulty with attempts at standing or walking after the fall.   PAST MEDICAL HISTORY: Osteoarthritis, coronary artery disease, depression, diabetes, myocardial infarction/hypertension, migraine headaches, shingles.   PAST SURGICAL HISTORY: Left carpal tunnel release, right carpal tunnel release, appendectomy, left total hip arthroplasty, right total hip arthroplasty, left total hip revision arthroplasty (2009), tonsillectomy.   MEDICATIONS: Percocet 1 to 2 tablets q. 4 to 6 hours p.r.n. pain, aspirin 81 mg daily, heparin 5000 units subcu q. 8 hours, hydralazine 25 mg q.i.d., sliding scale insulin, metformin 1000 mg b.i.d. with meals, Zofran disintegrating tablet 4 mg orally q. 6 hours p.r.n. nausea, Protonix 40 mg daily, pravastatin 80 mg at bedtime, and levofloxacin 250 mg IV piggyback q. 24 hours.   ALLERGIES: CEPHALOSPORINS, MORPHINE, PENICILLIN, SHELLFISH.   SOCIAL HISTORY: The patient is retired and is a widower. He denies any tobacco or alcohol use. He lives alone, with a daughter who lives just 2 houses away.   FAMILY HISTORY: Positive for asthma and cancer in his father and diabetes in his mother, brother and sister.   REVIEW OF SYSTEMS: Pertinent review of systems is notable for left lateral hip pain. He does report some bruising to the hip. He denies any knee pain. He  apparently has had difficulty with ambulation following the fall.   PHYSICAL EXAMINATION: GENERAL: The patient is a well-developed, well-nourished male, seen in no acute distress, seen lying supine in the bed.  LEFT HIP: Early ecchymosis is noted to the lateral aspect of the hip. Skin is intact. No significant tenderness to palpation about the knee or ankle. Some mild swelling is noted to the lateral aspect of the hip, but no gross tenderness to palpation is noted. Gentle passive range of motion of the left hip is well-tolerated. The patient demonstrates reasonably good strength with both hip flexion and abduction, although he does demonstrate some guarding with active range of motion of the left hip. Straight leg raise is negative. No knee effusion.  NEUROLOGIC:  The patient is awake, alert, and oriented. Sensory function is grossly intact. Motor strength is felt to be 5/5, with the exception of some guarding to the left hip musculature. Good motor coordination. No clonus or tremor.   X-RAYS:  I reviewed AP pelvis, AP and lateral radiographs of the left hip that were obtained earlier today. No gross evidence of a pelvic fracture. The bilateral total hip arthroplasties are noted. The left hip shows acetabular  and femoral components to be in good position. Locking ring for a constrained polyethylene liner is in good position. No evidence of dislocation. No periprosthetic fracture is appreciated. Vascular calcification is noted about the hip.   IMPRESSION: Contusion to the left hip, status post fall.   PLAN: Findings were discussed with the patient and his family. I agree with current orders for Physical Therapy to work on transfers and graduate  to weight-bearing as tolerated with ambulation. Pain seems to be well-controlled with the analgesics currently ordered. May consider use of ice to the lateral aspect of the hip as needed.    ____________________________ Illene Labrador. Angie Fava.,  MD jph:mr D: 10/03/2013 18:05:14 ET Hays: 10/03/2013 19:48:31 ET JOB#: [id number]  cc: Fayrene Fearing P. Angie Fava., MD, <Dictator> Illene Labrador Angie Fava MD ELECTRONICALLY SIGNED 10/28/2013 6:39

## 2014-12-30 NOTE — Discharge Summary (Signed)
PATIENT NAME:  Timothy Hays, Timothy Hays MR#:  [id number] DATE OF BIRTH:  [birth year]  DATE OF ADMISSION:  10/03/2013 DATE OF DISCHARGE:  10/07/2013   DISPOSITION: To [facility].   DISCHARGE DIAGNOSES:  1.  Left hip pain due to fall with no fracture.  2.  Hypertension.  3.  Enterobacter urinary tract infection.  4.  Diabetes mellitus, type 2.  5.  Hypertension.  6.  Hyperlipidemia.  7.  Coronary artery disease.  8.  Gastroesophageal reflux disease.  9.  Coffee-ground vomiting.   DISCHARGE MEDICATIONS: 1.  Metformin 1 g p.o. b.i.d.  2.  (\omeprazole 20 mg p.o. daily.  3.  Pravastatin  40 mg 2 tablets once a day.  4.  Aspirin 81 mg daily.  5.  Levaquin 250 mg q. 24 hours for 7 days. 6. hydrallazine 25 mg p.o. 4 times daily.  7.  Lasix 20 mg daily for 3 days.  tylenl 325 mg po q 6hrs prn for pain as needed CONSULTATIONS: On the 30th consulted with Dr. Ernest PineHooten and GI consult with Dr. Dow AdolphMatthew Rein.   DIET: Low-sodium, low-fat diet.   FOLLOWUP: The patient is follow up with Dr. Ernest PineHooten in about 1 to 2 weeks and Dr. Shelle Ironein in 1 to 2 weeks.   HOSPITAL COURSE:  1.  Hip pain. This is a 81 year old male patient brought in from home because the patient had a fall and complained of left hip pain. The patient brought in by the family. He lost balance and fell on the left hip and he was unable to walk because of severe pain. The patient's hip x-rays did not show any fractures, and the patient continued on physical therapy, pain medications and Dr. Ernest PineHooten has seen the patient because he had history of left hemiarthroplasty and left hip surgeries by Dr. Ernest PineHooten before. The patient was seen by Dr. Ernest PineHooten and he recommended that the patient might have contusion of left hip without any fracture, and he agreed with physical therapy, pain management. The patient was seen with physical therapy. They recommended rehab. The patient's family agreed and the patient is going to [facility] today.   2.  The patient's  other diagnoses include acute renal failure. The patient's BUN 24, creatinine 1.40 on admission with evidence of dehydration. Chest x-ray on admission did not show any kind pneumonia. It has cardiomegaly with congestion.   3.  The patient also complained of dysuria. The patient was started on IV fluids and his kidney function normalized to creatinine of 1 of the 20th of January, and he has good urine output. The patient feels much better today.   4.  Urinary tract infection. The patient had dysuria. Urinalysis on admission showed 1+ bacteria, 263 of WBC. Urine cultures positive for Enterobacter cloacae, more than 100,000 colonies, and it is sensitive to Levaquin. THE [name] ALLERGIC TO PENICILLIN, ( KEFLEX, AND ANCEF, SO WE CHOSE LEVAQUIN AND HE HAS BEEN AFEBRILE AND WHITE COUNT IS NOT ELEVATED SO HE CAN CONTINUE WITH LEVAQUIN AND FINISH FOR 7 MORE DAYS.   5.  Pulmonary edema. The patient had received fluids and yesterday he required oxygen, 2 litres 6.  Diabetes mellitus. Continue his metformin.   7.  Coffee-ground vomiting. The patient had coffee-ground vomiting on 26th night, and NG tube was inserted. Abdominal CAT scan was obtained ,it did not show any acute changes and the patient's abdominal CT mentioned that he may have some esophageal wall thickening and hernia, so he had an EGD by  Dr. Shelle Iron, and the patient also was started on IV PPIs along with fluids. NG tube suctioning was continued. The patient's hemoglobin was stable using this the whole time. The patient had only 1 episode of coffee-ground vomiting and NG tube suctioning  revealed only about 50 mL of coffee-ground vomiting, not any more than that. The patient's hemoglobin stayed stable at 12.3 and 11.9. Did not require  transfusions. EGD did not show any acute inflammation or erythema or ulcers and thought to have trauma and coffee-ground vomiting probably related to retching and irritation, and he is continued on PPIs. The patient did not  have further emesis and he started on diet tolerating the diet.   8.  The patient has marked hypoxia requiring oxygen on room air, was saturating 89% . The patient did receive fluids and he was n.p.o. because of his coffee-ground vomiting. So I repeated chest x-ray yesterday. It showed some pulmonary edema and the patient was started on small dose Lasix. The patient will get 3 more doses of Lasix when he goes to rehab. The patient right now is on 2 L. Sats are 93%. The patient can be weaned off as tolerated at [facility] rehab.   TIME SPENT ON DISCHARGE PREPARATION: More than 30 minutes.   CODE STATUS: DNR.   I discussed the plan with the patient's daughter.    ____________________________ Katha Hamming, MD sk:np D: 10/07/2013 12:59:16 ET Hays: 10/07/2013 15:45:28 ET JOB#: [id number]  cc: Katha Hamming, MD, <Dictator> Katha Hamming MD ELECTRONICALLY SIGNED 10/18/2013 15:50

## 2014-12-30 NOTE — Consult Note (Signed)
PATIENT NAME:  Timothy Hays, Timothy Hays MR#:  [id number] DATE OF BIRTH:  [birth year]  DATE OF CONSULTATION:  10/04/2013  REFERRING PHYSICIAN:  Ramonita Lab, MD CONSULTING PHYSICIAN:  Hardie Shackleton. Colin Benton, PA-C ATTENDING GASTROENTEROLOGIST: Dow Adolph, MD  REASON FOR CONSULTATION: Coffee-ground emesis   HISTORY OF PRESENT ILLNESS: This is a pleasant 81 year old gentleman accompanied by his daughter, Timothy Hays, who was initially admitted after sustaining a fall secondary to weakness. He fell on his left hip, where he had a history of having a left hip replacement. He has undergone some imaging of the left hip, and overall findings were unrevealing, though he does have some contusions. He denies any chest pain or shortness of breath. He denies any dizziness or headaches. No vision changes. He was also found to have a urinary tract infection in the Emergency Room and was started on Cipro. Beginning last night around 7:00 p.m., he vomited, though it appeared to be just his food and what he had eaten. It was a greenish color, according to the daughter. Then beginning very early this morning around 3:00 a.m. he started vomiting, and his vomit was appearing like coffee grounds. An NG tube was placed and I can see the output currently, which is still somewhat large quantity and it does appear to be very black in color. His hemoglobin at admission was 12.3, and it has come down to 11.9 with an MCV of 93. A CT scan was obtained to further evaluate, and it does show distal esophageal wall thickening with a small hiatal hernia. This is suggestive of esophagitis, though an EGD was recommended to rule out neoplasm. LFTs and white blood cells have been unrevealing. The patient is actually still feeling quite weak and has been sleeping through most of the day, according to the patient's daughter. No unintentional weight changes. She is unsure if he has ever had an EGD in the past.   PAST MEDICAL HISTORY: Diabetes mellitus, hypertension,  dyslipidemia, coronary artery disease, hard of hearing, GERD, osteoarthritis, migraines.   PAST SURGICAL HISTORY: Bilateral carpal tunnel release, appendectomy, tonsillectomy, penile surgery, and bilateral hip replacement.   ALLERGIES: PENICILLIN, CEPHALOSPORINS, AND MORPHINE.   SOCIAL HISTORY: The patient lives alone. He has chosen to be code status DNR. He denies any alcohol, tobacco, or illicit drug use.   FAMILY HISTORY: No known family history of GI malignancy, colon polyps, or IBD.   HOME MEDICATIONS: Prevacid, metformin, aspirin, hydralazine, and pravastatin.   REVIEW OF SYSTEMS: Ten-system review of systems  was obtained on the patient. Pertinent positives are mentioned above and otherwise negative.   OBJECTIVE: VITAL SIGNS: Blood pressure 125/78, heart rate 91, respirations 20, temperature 98.1. Bedside pulse oximetry is 89%.  GENERAL: This is a pleasant 81 year old gentleman resting quietly and comfortably in bed, in no acute distress. Alert and oriented x 3, though he does seem quite tiresome.  HEAD: Atraumatic, normocephalic.  NECK: Supple. No lymphadenopathy noted.  HEENT: Sclerae anicteric. Mucous membranes moist. NG tube is noted in place with very dark, coffee-ground-appearing output.  LUNGS: Respirations are even and unlabored. Clear to auscultation bilateral anterior lung fields.  CARDIAC: Regular rate and rhythm. S1, S2 noted.  ABDOMEN: Soft, nontender, nondistended. Normoactive bowel sounds noted in all 4 quadrants. No guarding or rebound. No masses palpated. No hernias, masses, or organomegaly appreciated.  RECTAL: Deferred.  PSYCHIATRIC: Appropriate mood and affect.  EXTREMITIES: Negative for lower extremity edema, 2+ pulses noted in bilateral upper extremities.   LABORATORY DATA: White blood cells 8.9, hemoglobin 11.9,  MCV 93, hematocrit 36.4, platelets 144. Sodium 133, potassium 4.5, BUN 24, creatinine 1.47, glucose 163. LFTs within normal limits. Troponins  negative. Urinalysis is positive for a UTI.   IMAGING: Left hip x-ray is stable.   Chest x-ray reveals cardiomegaly but no evidence of pneumonia or CHF.   CT scan was obtained showing distal esophageal wall thickening, suggestive of esophagitis, though neoplasm could not be ruled out. A small hiatal hernia was noted, with an NG tube noted in appropriate position. Sigmoid diverticulosis without diverticulitis was observed. There was also a cystic lesion in the right kidney. Exam was limited secondary to a significant amount of motion artifact.   ASSESSMENT: 1.  Urinary tract infection.  2.  Fall causing left hip pain.  3.  Weakness.  4.  Nausea and vomiting with new-onset coffee-ground emesis over the past 12 hours or so. The patient's hemoglobin was 12.3 at admission and today is 11.9. The patient has an NG tube and continues to have very dark, coffee-ground-appearing output. Aspirin and heparin are currently being held.  5.  Abnormal CT scan showing distal esophageal wall thickening and a small hiatal hernia.   PLAN: I have discussed this patient's case in detail with Dr. Dow AdolphMatthew Rein who is involved in the development of the patient's plan of care. Certainly due to the coffee-ground emesis and the very dark-appearing output through his NG tube, in combination with the abnormal CT findings of distal esophageal wall thickening, we do feel that an EGD would be an appropriate next step. Alternatives, risks, and benefits of this procedure were outlined in detail for the patient, as well as his daughter who is his medical power of attorney, and they have both verbalized understanding and are in agreement to proceed. We do agree with him still receiving Protonix 40 mg daily and keeping him n.p.o. status. We can likely proceed with the EGD tomorrow, but timeframe will be discussed with Dr. Shelle Ironein to confirm. Please continue to check serial hemoglobins to ensure stability and be prepared to transfuse if  necessary. We will continue to monitor this patient throughout hospitalization and make further recommendations pending above and per clinical course.   Thank you so much for this consultation and for allowing us to participate in the patient's plan of care.   ATTENDING GASTROENTEROLOGIST: Dow AdolphMatthew Rein, MD  ____________________________ Hardie ShackletonKaryn M. Donyale Berthold, PA-C kme:jcm D: 10/04/2013 16:43:01 ET T: 10/04/2013 17:19:08 ET JOB#: [id number]  cc: Hardie ShackletonKaryn M. Calum Cormier, PA-C, <Dictator> Hardie ShackletonKARYN M Klani Caridi PA ELECTRONICALLY SIGNED 10/05/2013 10:53

## 2014-12-30 NOTE — Consult Note (Signed)
GI note: done today.  Was normal except for NG trauma.   No explanation for hematemesis.  for discharge from GI bleed standpoint. wold continue protonix or omeprazole at 40 mg daily.   Electronic Signatures: Dow Adolphein, Matthew (MD)  (Signed on 28-Jan-15 16:14)  Authored  Last Updated: 28-Jan-15 16:14 by Dow Adolphein, Matthew (MD)

## 2014-12-30 NOTE — Consult Note (Signed)
GI note.  I have seen Mr Timothy Hays and agree with Timothy Hays's a/p.   Given the relatively large amount of coffee ground emesis,  will plan to to EGD to r/o high risk lesion or malignancy.   Npo after midnight tonight.   Cont PPI drip. Monitor Hgb Do not need to keep NG tube in place.    Electronic Signatures: Dow Adolphein, Matthew (MD)  (Signed on 27-Jan-15 17:30)  Authored  Last Updated: 27-Jan-15 17:30 by Dow Adolphein, Matthew (MD)

## 2014-12-30 NOTE — Consult Note (Signed)
Brief Consult Note: Diagnosis: Contusion left hip, status post Total Hip Arthroplasty.   Patient was seen by consultant.   Consult note dictated.   Comments: Agree with PT, pain management. I believe patient would benefit from rehabilitation in SND.  Electronic Signatures: Donato HeinzHooten, James P (MD)  (Signed 26-Jan-15 18:21)  Authored: Brief Consult Note   Last Updated: 26-Jan-15 18:21 by Donato HeinzHooten, James P (MD)

## 2014-12-30 NOTE — H&P (Signed)
PATIENT NAME:  Timothy Hays, [name] MR#:  [id number] DATE OF BIRTH:  [birth year]  DATE OF ADMISSION:  10/03/2013  PRIMARY CARE PHYSICIAN: Dr.  Levan HurstMonique Lee  REFERRING PHYSCIAN:   Dr. Zenda AlpersWebster.   CHIEF COMPLAINT: Fall from weakness.   HISTORY OF PRESENT ILLNESS: The patient is a 81 year old pleasant male with a past medical history of diabetes mellitus, hypertension, hyperlipidemia, coronary artery disease, bilateral hip replacement, is brought into the ER after he sustained a fall and fell on his left hip. The patient has been feeling weak and falling often lately as reported by the family. Yesterday, the patient lost his balance and fell on the left hip. Following that, he was complaining of hip pain secondary to which he was sent over to the ER. Denies any dizziness, chest pain or shortness of breath. Denies any headache or blurry vision either. The patient is very hard of hearing and no family members are present during my examination. His renal function is abnormal and positive for urinary tract infection. Urine cultures were obtained and the patient has given ciprofloxacin. Hospitalist team is called to admit the patient. During my examination, the patient is resting comfortably, but requesting some pain medications for the left hip pain. No family members are at bedside.   PAST MEDICAL HISTORY: Diabetes mellitus, hypertension, hyperlipidemia, GERD, coronary artery disease.   PAST SURGICAL HISTORY: Bilateral hip replacement.   ALLERGIES: PENICILLIN, MORPHINE, PENICILLIN CAUSES ANAPHYLAXIS.  PSYCHOSOCIAL HISTORY: Lives at home by himself, ambulates with the help of a walker.  No smoking, alcohol or illicit drug use.   FAMILY HISTORY: Mother and father deceased from heart disease complications. Mother was also diabetic.   HOME MEDICATIONS:  Prevacid 40 mg 2 capsules p.o. once daily,  metformin 1000 mg 2 times a day.  PHYSICAL EXAMINATION: VITAL SIGNS: Temperature 98.4, pulse 76, respirations 18,  blood pressure 166/86, pulse oximetry 93%.  GENERAL APPEARANCE: Not in acute distress pleasant, looking.  HEENT: Normocephalic, atraumatic. Pupils are equally reacting to light and accommodation. No scleral icterus. Extraocular movements are intact. No scleral conjunctiva. No sinus tenderness. NECK:  Supple.  No JVD. No thyromegaly. No lymphadenopathy.  LUNGS: Clear to auscultation bilaterally. No accessory muscle use and no anterior chest wall tenderness on palpation.  CARDIAC: S1, S2 normal. Regular rate and rhythm. No murmurs.  GASTROINTESTINAL: Soft. Bowel sounds are positive in all four quadrants. Nontender, nondistended. No hepatosplenomegaly. No masses.  NEUROLOGIC:  Awake, alert and oriented x 3. Motor and sensory are grossly intact. Reflexes are 2+. The patient is very, very hard of hearing.   SKIN: Warm to touch. Normal turgor. No rashes. No lesions.  MUSCULOSKELETAL: Hip area has bruising and tender from fall. The rest of the joints with no contusions and no effusions.  Peripheral pulses are 2+  LABORATORY AND IMAGING STUDIES: Glucose 120, BUN 24, creatinine 1.4, sodium 138, potassium 4.0, chloride 108, CO2 26, GFR 43, anion gap 4, serum osmolality 281, calcium 8.4. LFTs: Albumin is low at 3.3. The rest of them are normal. Troponin less than 0.02. WBC 9.5, hemoglobin 11.3, hematocrit 37.9, platelets are 172.   URINALYSIS: Yellow in color, cloudy in appearance, nitrites are negative, leukocyte esterase 3+, WBC 253 per high-power field. WBC clumps are present.   Left hip x-ray:  Stable hip arthroplasties.  No significant interval changes.   Portable chest x-ray has revealed no evidence of congestive heart failure and pneumonia. Cardiomegaly is present in the vascular congestion.   A12-lead EKG has revealed sinus rhythm  with first-degree AV block, PR interval at 228 ms, normal QRS and QT.   ASSESSMENT AND PLAN: A 81 year old male presenting to the ER with a chief complaint of weakness and  fall. He will be admitted with following assessment and plan.  1.  Acute cystitis. We will do a urine culture. The patient will be provided with IV fluids. We will give him IV levofloxacin.  2.  Acute kidney injury.  Probably prerenal. We will provide him IV fluids, and avoid nephrotoxins. If necessary we will consider consulting nephrology.  3.  Left hip pain, probably from contusion from a fall. Pain management will be provided. PT consult is placed regarding his gait evaluation and mobility function if necessary for deconditioning.  4.  Diabetes mellitus.  We will keep him on sliding scale insulin.  5.  Hypertension. Blood pressure is stable at this time.  6.  History of gastroesophageal reflux.  We will give Protonix.  7.  We will provide him gastrointestinal and deep vein thrombosis prophylaxis with Protonix and heparin subcutaneous.   The diagnosis and plan of care detail with the patient. He agreed with the plan.   TOTAL TIME SPENT ON ADMISSION: 45 minutes.    ____________________________ Ramonita Lab, MD ag:cc D: 10/03/2013 01:15:28 ET T: 10/03/2013 01:43:33 ET JOB#: [id number]  cc: Ramonita Lab, MD, <Dictator> Guinevere Scarlet, MD  Ramonita Lab MD ELECTRONICALLY SIGNED 10/16/2013 7:18

## 2015-01-07 NOTE — Consult Note (Signed)
PATIENT NAME:  Timothy Hays, Timothy Hays MR#:  [id number] DATE OF BIRTH:  02-May-1919  DATE OF CONSULTATION:  10/27/2014  REFERRING PHYSICIAN:   CONSULTING PHYSICIAN:  Ezzard Standing. Bluford Kaufmann, MD  REASON FOR REFERRAL: Hematemesis.   HISTORY OF PRESENT ILLNESS: The patient is a 81 year old, white male, with a known history of hypertension, coronary artery disease and diabetes, who had an episode of GI bleeding in January, but came back with projectile vomiting with black coffee-grounds. He apparently woke up the morning of admission, had felt nauseous and started vomiting one time. He also noticed some black stools, as well. He has had some decreased appetite for the past few days. When he first had an episode of hematemesis, he had some chest pain. Currently, he denies it right now. There is no shortness of breath. He has not had any further vomiting since last night, which was the day of admission. He was in the Emergency Room earlier after a fall. He had some lacerations and fractured 2 toes. He has been feeling weak lately.   PAST MEDICAL HISTORY: Notable for dementia, diabetes, coronary artery disease, hypertension, reflux. He had a bout of cystitis, gout, melanoma of the penis.   PAST SURGICAL HISTORY: Includes hip replacement, bilaterally. Resection of the melanotic lesion on the penis.   SOCIAL HISTORY: He lives at home. He has a 24-hour caregiver. The patient does not drink or smoke.   FAMILY HISTORY: Notable for heart disease.   ALLERGIES: HE IS ALLERGIC TO PENICILLIN AND PENICILLIN DERIVATIVES, MORPHINE AND SHELLFISH.   MEDICATIONS AT HOME: Include omeprazole once a day, metformin 500 mg daily,  doxycycline twice a day, baby aspirin, amlodipine 2.5 mg once a day, and some Tylenol Arthritis.   REVIEW OF SYSTEMS: Cannot be obtained very well because of partial dementia, but he denied having any fevers or chills or weight changes. He also denies shortness of breath or chest pain at this time.   PHYSICAL  EXAMINATION:  GENERAL: The patient is in no acute distress.  VITAL SIGNS: He is afebrile. Vital signs are stable.  HEENT: Showed normocephalic, atraumatic head. Pupils are equally reactive. Throat was clear bilaterally.  ABDOMEN: Showed normoactive bowel sounds. It was soft. There was a slight distention of the abdomen, but no rebound or guarding. There is no hepatosplenomegaly. NEUROLOGIC: Examination is intact.   LABORATORIES ON ADMISSION: Showed sodium 135, potassium 4.3, BUN 21, creatinine is 1.14, calcium level is 10.6. Lipase was 88. Liver enzymes were normal. Albumin is slightly low at 3.3. White count is 11.5. Hemoglobin was 11.8. It dropped down to 10.8 this morning.   He does have a urinary tract infection based on urine culture.   He had a CT scan of the abdomen that shows some hepatic steatosis. There is a thickening of the wall of the distal esophagus. There is a cystic mass in the right kidney, which is small. There is evidence of sigmoid diverticulosis without diverticulitis. There is also spinal stenosis, as well.   IMPRESSION: This is a patient with a bout of hematemesis and nausea and vomiting that has stopped since being admitted. CT scan shows evidence of esophagitis, which is the likely source of bleeding. I recommend that we proceed with an upper endoscopy this morning to visualize the esophagus. The patient is already placed on a Protonix drip. Baby aspirin was held in light of the acute gastrointestinal bleeding.   Thank you for the referral.    ____________________________ Ezzard Standing. Bluford Kaufmann, MD pyo:JT D: 10/29/2014 07:26:19  ET T: 10/29/2014 08:32:28 ET JOB#: [id number]  cc: Ezzard StandingPaul Y. Bluford Kaufmannh, MD, <Dictator> Ezzard StandingPAUL Y Kashmir Lysaght MD ELECTRONICALLY SIGNED 11/01/2014 10:52

## 2015-01-07 NOTE — H&P (Signed)
PATIENT NAME:  Timothy Hays, Timothy Hays MR#:  [id number] DATE OF BIRTH:  06-30-1919  DATE OF ADMISSION:  10/26/2014  REFERRING EMERGENCY ROOM PHYSICIAN: Dr. Chaney Mallingavid Yao.    PRIMARY CARE PHYSICIAN: Dr. Levan HurstMonique Lee.    CHIEF COMPLAINT: Hematemesis and melena.   HISTORY OF PRESENT ILLNESS: This 81 year old man with past medical history of dementia, hypertension, coronary artery disease, diabetes mellitus, and acute GI bleed in January 2015, presents today after 1 episode of projectile vomiting of tarry black emesis. The history is provided by his daughter as the patient has dementia. She reports that at approximately 5:00 a.m. the patient awoke and had abrupt vomiting of black tarry emesis. Family arrived around 8:30 and found the patient in bed covered in this vomit. On further discussion with care givers it seems that a nurse's aide noted runny tarry stools on Sunday. He has had a decreased appetite for the past few days. The patient reports that he had sudden chest pain prior to the episode of hematemesis. He was actually in the Emergency Room earlier this week after a fall with fracture of 2 toes and laceration requiring stitches. He has been very weak lately and has had multiple falls. At the time of presentation his blood pressure and hemoglobin are stable.   PAST MEDICAL HISTORY:  1. Dementia.  2. Diabetes mellitus type 2.  3. Hypertension.  4. Coronary artery disease.  5. Gastroesophageal reflux disease.  6. History of upper GI bleed during hospitalization in January 2015 for cystitis.  7. Gout.  8. Melanoma of the penis.   PAST SURGICAL HISTORY:  1. Bilateral hip replacement.  2. Resection of melanotic lesion of the penis.   SOCIAL HISTORY: The patient lives at home. He is home bound and has 24-hour caregivers. His daughter lives nearby. He does not smoke, drink alcohol, or use any illicit substances. He ambulates with a walker.   FAMILY HISTORY: Positive for heart disease in mother and father.  Diabetes in his mother.   ALLERGIES: HE IS ALLERGIC TO PENICILLIN, KEFLEX, ANCEF, KEFZOL, SHELLFISH, AND MORPHINE.   HOME MEDICATIONS:  1. Tylenol Arthritis extended release 650 mg 2 tablets once a day at bedtime.  2. Omeprazole 20 mg 1 tablet once a day.  3. Metformin 500 mg 1 tablet daily.  4. Latanoprost ophthalmic 1 drop in each effected eye in the evening.  5. Doxycycline monohydrate 100 mg 1 tablet twice a day.  6. Aspirin 81 mg 1 tablet daily.  7. Amlodipine 2.5 mg 1 tablet once a day.     REVIEW OF SYSTEMS: Unable to obtain review of systems due to dementia.   PHYSICAL EXAMINATION:  VITAL SIGNS: Temperature 98, pulse 78, respirations 18, blood pressure 159/92, oxygenation 98% on room air.  GENERAL: No acute distress.  HEENT: Pupils equal, round, and reactive, the right pupil is not perfectly round and may have some surgical scarring, extraocular motion is intact. Conjunctivae are clear. Oral mucous membranes pink and moist, good dentition. No posterior oropharyngeal erythema or edema. No cervical lymphadenopathy. Trachea midline. Thyroid nontender.  PULMONARY: Lungs clear to auscultation bilaterally with good air movement.  CARDIOVASCULAR: Regular rate and rhythm. No murmurs, rubs, or gallops. No peripheral edema. Peripheral pulses 2 +.  ABDOMEN: Distended, no guarding, no rebound, no hepatosplenomegaly. Bowel sounds are decreased.  MUSCULOSKELETAL: No joint effusions. Range of motion normal. Strength and tone are equal bilaterally. Strength 5 out of 5.  SKIN: No rash. He does have a scabbed over lesion on the right  upper ear and a sebaceous cyst on the left upper lip that he is concerned about. No diaphoresis, skin turgor is good.  NEUROLOGIC: Cranial nerves II through XII are grossly intact, strength is 4 out of 4 bilaterally. Sensation is intact.  PSYCHIATRIC: He is alert and oriented to person and place, not to date. He seems to have poor insight into his clinical condition.    LABORATORY DATA: Sodium 135, potassium 4.3, chloride 102, bicarbonate 27, BUN 21, creatinine 1.14, glucose 147, calcium 10.6. Lipase 88. LFTs normal with the exception of decreased albumin at 3.3. Troponin less than 0.02. White blood cells 11.5, hemoglobin 11.8, MCV is 95, platelets 244,000.    IMAGING: CT abdomen and pelvis with contrast shows no free fluid in the abdomen or pelvis, some renal cysts which are stable or decreased in size. There is stable thickening of the wall of the distal esophagus with a small hiatal hernia. Multiple sigmoid diverticula without diverticulitis, possible cystitis with mild wall thickening of the urinary bladder, no bowel obstruction, no abscess. There are atherosclerotic type changes, spinal stenosis at L4-L5, total hip prostheses bilaterally.     ASSESSMENT AND PLAN:  1.  Acute gastrointestinal bleed: This seems to be an upper gastrointestinal bleed with hematemesis and melena. At this time his blood pressure and hemoglobin are stable. We will cycle hemoglobins every 6 hours. We will start a Protonix drip. Will obtain a gastroenterology consultation for possible EGD. The patient will be n.p.o. We will start IV fluid.  2.  Hypertension: At the time of my examination blood pressure has escalated to about 180/100, I think this is due to the bolus of normal saline he is currently receiving. Continue with amlodipine at his home dose. Provide hydralazine p.r.n. for systolics greater than 180. 3.  Hypercalcemia: Likely due to dehydration. We will continue to provide IV fluids.  4.  Coronary artery disease: Hold aspirin in the setting of acute GI bleed. He is not on a statin at home or a beta blocker. He is not having any ongoing chest pain at this time, troponin is negative.  5.   Diabetes mellitus type 2: Check a hemoglobin A1c and continue on sliding scale insulin.  Hold oral hypoglycemic agents during hospitalization.  6.  Gastroesophageal reflux disease: Continue PPI  with Protonix drip.  7.  Do Not Resuscitate status: Discussed and confirmed with daughter at the bedside on admission.  8. UTI: urine and blood cultures pending. Start IV levaquin. 9. weakness and falls: PT consultation  TIME SPENT ON ADMISSION: 40 minutes.     ____________________________ Ena Dawley. Clent Ridges, MD cpw:bu D: 10/26/2014 15:17:52 ET Hays: 10/26/2014 15:59:11 ET JOB#: [id number]  cc: Santina Evans P. Clent Ridges, MD, <Dictator> Gale Journey MD ELECTRONICALLY SIGNED 10/26/2014 23:25

## 2015-01-07 NOTE — Consult Note (Signed)
Pt seen and examined. Nausea and vomiting with coffee ground emesis. Last episode last night. Also, c/o melena and epigastric pain. CT shows esophagitis? Pt NPO. On PPI. No chest pain or SOB. For EGD this AM. Thanks.   Electronic Signatures: Lutricia Feilh, Fey Coghill (MD) (Signed on 19-Feb-16 10:33)  Authored   Last Updated: 19-Feb-16 10:34 by Lutricia Feilh, Mahmoud Blazejewski (MD)

## 2015-01-07 NOTE — Discharge Summary (Signed)
PATIENT NAME:  Timothy Hays MR#:  [id number] DATE OF BIRTH:  1919-07-22  DATE OF ADMISSION:  10/26/2014 DATE OF DISCHARGE:  10/30/2014  ADMITTING PHYSICIAN: Elby Showersatherine Walsh, MD  DISCHARGING PHYSICIAN: Enid Baasadhika Latoiya Maradiaga, MD  PRIMARY CARE PHYSICIAN: Dr. Nedra HaiLee   DISCHARGE DIAGNOSES:  1.  Hematemesis secondary to severe esophagitis.  2.  Early dementia.  3.  Diabetes.  4.  Hypertension.  5.  Coronary artery disease.  6.  Gastroesophageal reflex disease.  7.  Gout.  8.  Glaucoma.   DISCHARGE HOME MEDICATIONS:  1.  Amlodipine 2.5 mg p.o. daily.  2.  Metformin 500 mg p.o. daily.  3.  Tylenol arthritis 650 mg 2 tablets once a day at bedtime.  4.  Latanoprost ophthalmic solution 0.005% 1 drop each eye once a day.  5.  Tramadol 50 mg 1 tablet q.6h. p.r.n. for pain.  6.  Protonix 40 mg p.o. b.i.d.  7.  Sucralfate 1 gram per 10 mL's oral suspension or use a tablet dissolved in water.  Give 1 gram 4 times a day before meals at least 30 minutes before meals and hour after meals and at bedtime.   DISCHARGE DIET: Low-sodium diet.   DISCHARGE ACTIVITY: As tolerated.   FOLLOWUP INSTRUCTIONS:  1.  GI follow-up in 3 weeks.  2.  PCP follow-up in 1 week.  3.  Physical therapy.  LABORATORIES AND IMAGING STUDIES PRIOR TO DISCHARGE: Hemoglobin is stable at 10.6. WBC 11.3, hematocrit 37.1 on admission, platelet count is 244,000. BMP showing sodium 138, potassium 4.2, chloride 103, bicarbonate 26, BUN 19, creatinine 1.29, glucose 116, calcium 9.2. Urine cultures growing greater than 100,000 colonies of Proteus mirabilis.  Pelvic x-ray for weakness showing no acute bony abnormality, however, significant osteopenia and bilateral total hip arthroplasties noted and atherosclerotic calcifications noted.  CT of the abdomen and pelvis with contrast on admission showing cystic mass in the right kidney, could be a ruptured cyst, but no free fluid indicating the rupture is not an acute event, otherwise stable.   Multiple sigmoid diverticulosis without diverticulitis and atherosclerosis noted. EGD done by GI on 10/27/2014 showing LA grade D reflex esophagitis, otherwise normal.   BRIEF HOSPITAL COURSE: Timothy Hays is a 81 year old, very pleasant elderly Caucasian male with past medical history significant for diabetes, hypertension, coronary artery disease, and GERD presents with hematemesis or melena. 1.  Upper GI bleed presenting with coffee-ground emesis. He was seen by GI, had upper gastrointestinal endoscopy done which showed severe esophagitis. The patient started on twice a day proton pump inhibitor and will need it for 8 weeks before changing it to daily. We will follow up with Dr. Bluford Kaufmannh as an outpatient in 4 weeks. Heartburn is improving. He will continue his sucralfate 4 times a day for 1 month for now. His vitals have been otherwise stable. All his other home medications are being continued.  2.  Proteus urinary tract infection based on culture, is being treated with Levaquin. Finished 5 day course in the hospital. 3.  His course has been otherwise uneventful in the hospital.   DISCHARGE CONDITION: Stable.   DISCHARGE DISPOSITION: To short-term rehabilitation as recommended by physical therapy.   TIME SPENT ON DISCHARGE: 40 minutes   CODE STATUS:  DO NOT RESUSCITATE.   ____________________________ Enid Baasadhika Kou Gucciardo, MD rk:mc D: 10/30/2014 12:18:08 ET Hays: 10/30/2014 12:38:09 ET JOB#: [id number]  cc: Enid Baasadhika Khloi Rawl, MD, <Dictator> Ezzard StandingPaul Y. Bluford Kaufmannh, MD Enid BaasADHIKA Million Maharaj MD ELECTRONICALLY SIGNED 11/23/2014 17:08

## 2015-01-07 NOTE — Consult Note (Signed)
EGD showed severe reflux esophagitis. No bleeding. Cytology done in case this was candidiasis. Make sure patient stays on PPI bid for minimum 8 weeks. Clear  liquid diet. Advance diet as tolerated. Thanks.  Electronic Signatures: Lutricia Feilh, Aminta Sakurai (MD)  (Signed on 19-Feb-16 11:16)  Authored  Last Updated: 19-Feb-16 11:16 by Lutricia Feilh, Micholas Drumwright (MD)

## 2015-01-07 NOTE — Consult Note (Signed)
Chief Complaint:  Subjective/Chief Complaint Tolerating diet without n/v/hematemesis. Some chest pressure after eating, which is to be expected for someone with severe reflux esophagitis. Cytology negative for candida.   VITAL SIGNS/ANCILLARY NOTES: **Vital Signs.:   20-Feb-16 05:50  Vital Signs Type Routine  Temperature Temperature (F) 97.2  Celsius 36.2  Temperature Source oral  Pulse Pulse 79  Respirations Respirations 20  Systolic BP Systolic BP 158  Diastolic BP (mmHg) Diastolic BP (mmHg) 83  Mean BP 108  Pulse Ox % Pulse Ox % 92  Pulse Ox Activity Level  At rest  Oxygen Delivery Room Air/ 21 %   Brief Assessment:  GEN no acute distress   Cardiac Regular   Respiratory clear BS   Lab Results: Routine Hem:  20-Feb-16 05:23   Hemoglobin (CBC)  10.1 (Result(s) reported on 28 Oct 2014 at 06:25AM.)   Assessment/Plan:  Assessment/Plan:  Assessment Severe reflux esophagitis. Stable from GI point of view.   Plan Stay on protonix bid for min 8 weeks. Will sign off. Pt can f/u with me in a month or so. thanks.   Electronic Signatures: Lutricia Feilh, Aradhya Shellenbarger (MD)  (Signed 20-Feb-16 11:39)  Authored: Chief Complaint, VITAL SIGNS/ANCILLARY NOTES, Brief Assessment, Lab Results, Assessment/Plan   Last Updated: 20-Feb-16 11:39 by Lutricia Feilh, Avigdor Dollar (MD)

## 2015-08-27 ENCOUNTER — Other Ambulatory Visit: Payer: Self-pay | Admitting: Internal Medicine

## 2016-08-30 ENCOUNTER — Encounter: Payer: Self-pay | Admitting: Emergency Medicine

## 2016-08-30 ENCOUNTER — Emergency Department
Admission: EM | Admit: 2016-08-30 | Discharge: 2016-08-30 | Disposition: A | Discharge status: Home / Self Care | Attending: Emergency Medicine | Admitting: Emergency Medicine

## 2016-08-30 DIAGNOSIS — Z79899 Other long term (current) drug therapy: Secondary | ICD-10-CM | POA: Diagnosis not present

## 2016-08-30 DIAGNOSIS — Z7984 Long term (current) use of oral hypoglycemic drugs: Secondary | ICD-10-CM | POA: Insufficient documentation

## 2016-08-30 DIAGNOSIS — N390 Urinary tract infection, site not specified: Secondary | ICD-10-CM | POA: Insufficient documentation

## 2016-08-30 DIAGNOSIS — I10 Essential (primary) hypertension: Secondary | ICD-10-CM | POA: Insufficient documentation

## 2016-08-30 DIAGNOSIS — R55 Syncope and collapse: Secondary | ICD-10-CM | POA: Diagnosis present

## 2016-08-30 LAB — BASIC METABOLIC PANEL
Anion gap: 8 (ref 5–15)
BUN: 22 mg/dL — AB (ref 6–20)
CO2: 25 mmol/L (ref 22–32)
CREATININE: 1.63 mg/dL — AB (ref 0.61–1.24)
Calcium: 9.3 mg/dL (ref 8.9–10.3)
Chloride: 102 mmol/L (ref 101–111)
GFR calc Af Amer: 40 mL/min — ABNORMAL LOW (ref >60)
GFR, EST NON AFRICAN AMERICAN: 34 mL/min — AB (ref >60)
GLUCOSE: 185 mg/dL — AB (ref 65–99)
POTASSIUM: 4.3 mmol/L (ref 3.5–5.1)
SODIUM: 135 mmol/L (ref 135–145)

## 2016-08-30 LAB — CBC
HEMATOCRIT: 33.7 % — AB (ref 40.0–52.0)
Hemoglobin: 11.1 g/dL — ABNORMAL LOW (ref 13.0–18.0)
MCH: 29.4 pg (ref 26.0–34.0)
MCHC: 33 g/dL (ref 32.0–36.0)
MCV: 88.9 fL (ref 80.0–100.0)
PLATELETS: 264 10*3/uL (ref 150–440)
RBC: 3.79 MIL/uL — ABNORMAL LOW (ref 4.40–5.90)
RDW: 14.8 % — AB (ref 11.5–14.5)
WBC: 7.4 10*3/uL (ref 3.8–10.6)

## 2016-08-30 LAB — URINALYSIS, COMPLETE (UACMP) WITH MICROSCOPIC
BILIRUBIN URINE: NEGATIVE
Glucose, UA: NEGATIVE mg/dL
KETONES UR: 5 mg/dL — AB
NITRITE: NEGATIVE
Protein, ur: 30 mg/dL — AB
Specific Gravity, Urine: 1.017 (ref 1.005–1.030)
pH: 5 (ref 5.0–8.0)

## 2016-08-30 LAB — TROPONIN I: Troponin I: <0.03 ng/mL (ref <0.03)

## 2016-08-30 MED ORDER — SODIUM CHLORIDE 0.9 % IV BOLUS (SEPSIS): 1000.0000 mL | Freq: Once | INTRAVENOUS | Status: DC

## 2016-08-30 MED ORDER — CEPHALEXIN 500 MG PO CAPS: 500.0000 mg | ORAL_CAPSULE | Freq: Two times a day (BID) | ORAL | 0 refills | Status: DC

## 2016-08-30 MED ORDER — CEFTRIAXONE SODIUM-DEXTROSE 1-3.74 GM-% IV SOLR
1.0000 g | Freq: Once | INTRAVENOUS | Status: AC
  Administered 2016-08-30: 1 g via INTRAVENOUS
  Filled 2016-08-30: qty 50

## 2016-08-30 MED ORDER — DEXTROSE 5 % IV SOLN: 1.0000 g | Freq: Once | INTRAVENOUS | Status: DC

## 2016-08-30 NOTE — ED Triage Notes (Signed)
Patient comes in via [facility] for a syncopal episode and the family was unable to arouse him. EMS reports patient's systolic bp on transport and on scene was 80's palpated. Denies any back or neck pain. Unknown length of LOC. Ems reports he was in a wheel chair.

## 2016-08-30 NOTE — ED Notes (Signed)
EMS called to take patient home, I will call Pam once EMS departs.

## 2016-08-30 NOTE — ED Provider Notes (Signed)
[facility] Emergency Department Provider Note  Time seen: 3:14 PM  I have reviewed the triage vital signs and the nursing notes.   HISTORY  Chief Complaint Loss of Consciousness    HPI Timothy Hays is a 82 y.o. male with a past medical history of diabetes, gastric reflux, hypertension, presents to the emergency department after a presumed syncopal episode. According to family the patient stays in a wheelchair. He was listening to Christmas Carol's and the patient lost consciousness for approximately 5 minutes. According to the family the patient's eyes were open but he was not responding to them. They called the patient's [facility] for help. EMS arrived and found the patient's blood pressure initially to be in the 80s systolic. Patient became arousable shortly after their arrival. Family states the patient has been acting normal ever since. They stated a history of drops in blood pressure which they have been told is due to orthostatic hypotension. However the patient was seated throughout the event. Patient denies any chest pain now or at any time. Denies any trouble breathing, nausea or sweatiness. Patient states he feels fine and wishes to go home. Family states the patient has a DO NOT RESUSCITATE order and is on hospice care.  Past Medical History:  Diagnosis Date  . Renal disorder     There are no active problems to display for this patient.   No past surgical history on file.  Prior to Admission medications   Medication Sig Start Date End Date Taking? Authorizing Provider  amLODipine (NORVASC) 2.5 MG tablet Take 1 tablet by mouth daily. 06/10/16   Historical Provider, MD  metFORMIN (GLUCOPHAGE) 500 MG tablet  06/10/16   Historical Provider, MD  pantoprazole (PROTONIX) 40 MG tablet  06/10/16   Historical Provider, MD  SPS 15 GM/60ML suspension  08/23/16   Historical Provider, MD  temazepam (RESTORIL) 30 MG capsule  07/21/16   Historical Provider, MD     Allergies  Allergen Reactions  . Morphine And Related Nausea And Vomiting  . Penicillins     No family history on file.  Social History Social History  Substance Use Topics  . Smoking status: Not on file  . Smokeless tobacco: Not on file  . Alcohol use Not on file    Review of Systems Constitutional: Negative for fever. Cardiovascular: Negative for chest pain. Respiratory: Negative for shortness of breath. Gastrointestinal: Negative for abdominal pain Musculoskeletal: Negative for back pain. Neurological: Negative for headache 10-point ROS otherwise negative.  ____________________________________________   PHYSICAL EXAM:  VITAL SIGNS: ED Triage Vitals  Enc Vitals Group     BP 08/30/16 1359 (!) 114/55     Pulse Rate 08/30/16 1359 83     Resp 08/30/16 1359 15     Temp --      Temp src --      SpO2 08/30/16 1359 97 %     Weight 08/30/16 1400 220 lb (99.8 kg)     Height 08/30/16 1400 5\' 7"  (1.702 m)     Head Circumference --      Peak Flow --      Pain Score --      Pain Loc --      Pain Edu? --      Excl. in GC? --     Constitutional: Alert and oriented. Well appearing and in no distress. Eyes: Normal exam ENT   Head: Normocephalic and atraumatic.   Mouth/Throat: Mucous membranes are moist. Cardiovascular: Normal rate, regular  rhythm. No murmurs, rubs, or gallops. Respiratory: Normal respiratory effort without tachypnea nor retractions. Breath sounds are clear Gastrointestinal: Soft and nontender. No distention.  Musculoskeletal: Nontender with normal range of motion in all extremities. No lower extremity tenderness or edema. Neurologic:  Normal speech and language. No gross focal neurologic deficits  Skin:  Skin is warm, dry and intact.  Psychiatric: Mood and affect are normal.   ____________________________________________    EKG  EKG reviewed and interpreted by myself shows A. fib versus normal sinus rhythm at 79 bpm, very regular rhythm  but difficult to decipher P waves, slightly widened QRS, normal axis, largely normal intervals with nonspecific ST changes but no ST elevations.  ____________________________________________   INITIAL IMPRESSION / ASSESSMENT AND PLAN / ED COURSE  Pertinent labs & imaging results that were available during my care of the patient were reviewed by me and considered in my medical decision making (see chart for details).  Patient presents the emergency department after a presumed syncopal episode versus hypo-itensive episode versus TIA. Currently the patient appears very well with a normal examination, family states patient is acting at baseline patient has hospice care at home. We'll check labs, EKG does not appear to show any concerning abnormalities. Patient has already expressed his desire to be discharged home everything looks normal.  Patient's repeat troponin is negative. Patient's urinalysis has resulted showing a significant urinary tract infection. We will dose Rocephin. We will discharge on antibiotics.  ____________________________________________   FINAL CLINICAL IMPRESSION(S) / ED DIAGNOSES  Syncope Urinary tract infection   Minna AntisKevin Ladarious Kresse, MD 08/30/16 1859

## 2016-08-30 NOTE — ED Notes (Signed)
[facility] wanting to know if we had any questions regarding patient. Checked with Dr Lenard LancePaduchowski no questions at this time.

## 2016-08-30 NOTE — ED Notes (Signed)
Patient has a visitor at the bedside will wait until they leave to get lab.

## 2016-08-30 NOTE — ED Notes (Signed)
Patient's daughter called inquiring about how much longer it would be before he would be dc.  I told her it would 30 minutes for the Rocephin, plus EMS time which is impossible to determine.  I told her I would call as soon as I knew how much longer.

## 2016-08-30 NOTE — ED Notes (Signed)
Helped pt. Use urinal

## 2016-08-30 NOTE — ED Notes (Signed)
[phone] Karis JubaPam Pritchard, daughter of patient called to ask about patient status. If patient is to be discharged, she requests that patient goes back via EMS and needs to be called prior to so that someone can meet at house.

## 2016-08-30 NOTE — ED Notes (Signed)
EMS called to take patient home 

## 2016-08-30 NOTE — ED Notes (Signed)
Patient d/c, Pam called to be at his home when he arrives.

## 2016-09-01 LAB — URINE CULTURE

## 2016-09-02 ENCOUNTER — Inpatient Hospital Stay
Admission: EM | Admit: 2016-09-02 | Discharge: 2016-09-03 | DRG: 872 | Disposition: A | Discharge status: Home / Self Care | Attending: Internal Medicine | Admitting: Internal Medicine

## 2016-09-02 ENCOUNTER — Emergency Department

## 2016-09-02 DIAGNOSIS — A419 Sepsis, unspecified organism: Principal | ICD-10-CM | POA: Diagnosis present

## 2016-09-02 DIAGNOSIS — Z885 Allergy status to narcotic agent status: Secondary | ICD-10-CM | POA: Diagnosis not present

## 2016-09-02 DIAGNOSIS — K920 Hematemesis: Secondary | ICD-10-CM | POA: Diagnosis present

## 2016-09-02 DIAGNOSIS — Z66 Do not resuscitate: Secondary | ICD-10-CM | POA: Diagnosis present

## 2016-09-02 DIAGNOSIS — N390 Urinary tract infection, site not specified: Secondary | ICD-10-CM | POA: Diagnosis present

## 2016-09-02 DIAGNOSIS — Z880 Allergy status to penicillin: Secondary | ICD-10-CM

## 2016-09-02 DIAGNOSIS — N183 Chronic kidney disease, stage 3 (moderate): Secondary | ICD-10-CM | POA: Diagnosis present

## 2016-09-02 DIAGNOSIS — Z91013 Allergy to seafood: Secondary | ICD-10-CM | POA: Diagnosis not present

## 2016-09-02 DIAGNOSIS — E118 Type 2 diabetes mellitus with unspecified complications: Secondary | ICD-10-CM | POA: Diagnosis present

## 2016-09-02 DIAGNOSIS — Z7984 Long term (current) use of oral hypoglycemic drugs: Secondary | ICD-10-CM

## 2016-09-02 DIAGNOSIS — Z881 Allergy status to other antibiotic agents status: Secondary | ICD-10-CM | POA: Diagnosis not present

## 2016-09-02 LAB — URINALYSIS, COMPLETE (UACMP) WITH MICROSCOPIC
BILIRUBIN URINE: NEGATIVE
GLUCOSE, UA: 50 mg/dL — AB
HGB URINE DIPSTICK: NEGATIVE
KETONES UR: NEGATIVE mg/dL
NITRITE: NEGATIVE
PH: 5 (ref 5.0–8.0)
Protein, ur: 30 mg/dL — AB
Specific Gravity, Urine: 1.025 (ref 1.005–1.030)

## 2016-09-02 LAB — CBC
HEMATOCRIT: 34.9 % — AB (ref 40.0–52.0)
HEMOGLOBIN: 11.3 g/dL — AB (ref 13.0–18.0)
MCH: 29 pg (ref 26.0–34.0)
MCHC: 32.4 g/dL (ref 32.0–36.0)
MCV: 89.5 fL (ref 80.0–100.0)
Platelets: 268 10*3/uL (ref 150–440)
RBC: 3.9 MIL/uL — ABNORMAL LOW (ref 4.40–5.90)
RDW: 14.8 % — ABNORMAL HIGH (ref 11.5–14.5)
WBC: 17.4 10*3/uL — AB (ref 3.8–10.6)

## 2016-09-02 LAB — COMPREHENSIVE METABOLIC PANEL
ALBUMIN: 3.7 g/dL (ref 3.5–5.0)
ALT: 10 U/L — ABNORMAL LOW (ref 17–63)
ANION GAP: 9 (ref 5–15)
AST: 20 U/L (ref 15–41)
Alkaline Phosphatase: 50 U/L (ref 38–126)
BILIRUBIN TOTAL: 0.4 mg/dL (ref 0.3–1.2)
BUN: 33 mg/dL — ABNORMAL HIGH (ref 6–20)
CHLORIDE: 104 mmol/L (ref 101–111)
CO2: 25 mmol/L (ref 22–32)
Calcium: 9.3 mg/dL (ref 8.9–10.3)
Creatinine, Ser: 1.43 mg/dL — ABNORMAL HIGH (ref 0.61–1.24)
GFR calc Af Amer: 46 mL/min — ABNORMAL LOW (ref >60)
GFR, EST NON AFRICAN AMERICAN: 40 mL/min — AB (ref >60)
Glucose, Bld: 227 mg/dL — ABNORMAL HIGH (ref 65–99)
POTASSIUM: 4.7 mmol/L (ref 3.5–5.1)
Sodium: 138 mmol/L (ref 135–145)
TOTAL PROTEIN: 7.8 g/dL (ref 6.5–8.1)

## 2016-09-02 LAB — LIPASE, BLOOD: LIPASE: 15 U/L (ref 11–51)

## 2016-09-02 LAB — LACTIC ACID, PLASMA
Lactic Acid, Venous: 2.6 mmol/L (ref 0.5–1.9)
Lactic Acid, Venous: 2.2 mmol/L (ref 0.5–1.9)

## 2016-09-02 LAB — GLUCOSE, CAPILLARY
GLUCOSE-CAPILLARY: 178 mg/dL — AB (ref 65–99)
Glucose-Capillary: 154 mg/dL — ABNORMAL HIGH (ref 65–99)

## 2016-09-02 MED ORDER — INSULIN ASPART 100 UNIT/ML ~~LOC~~ SOLN
0.0000 [IU] | Freq: Three times a day (TID) | SUBCUTANEOUS | Status: DC
  Administered 2016-09-02 – 2016-09-03 (×3): 2 [IU] via SUBCUTANEOUS
  Administered 2016-09-03: 1 [IU] via SUBCUTANEOUS
  Filled 2016-09-02 (×3): qty 2
  Filled 2016-09-02: qty 1

## 2016-09-02 MED ORDER — PANTOPRAZOLE SODIUM 40 MG IV SOLR
40.0000 mg | Freq: Two times a day (BID) | INTRAVENOUS | Status: DC
  Administered 2016-09-02 – 2016-09-03 (×3): 40 mg via INTRAVENOUS
  Filled 2016-09-02 (×2): qty 40

## 2016-09-02 MED ORDER — ONDANSETRON HCL 4 MG/2ML IJ SOLN: 4.0000 mg | Freq: Four times a day (QID) | INTRAMUSCULAR | Status: DC | PRN

## 2016-09-02 MED ORDER — PANTOPRAZOLE SODIUM 40 MG IV SOLR
INTRAVENOUS | Status: AC
  Administered 2016-09-02: 40 mg via INTRAVENOUS
  Filled 2016-09-02: qty 40

## 2016-09-02 MED ORDER — GABAPENTIN 100 MG PO CAPS
100.0000 mg | ORAL_CAPSULE | Freq: Three times a day (TID) | ORAL | Status: DC
  Administered 2016-09-02 – 2016-09-03 (×4): 100 mg via ORAL
  Filled 2016-09-02 (×4): qty 1

## 2016-09-02 MED ORDER — LEVOFLOXACIN IN D5W 500 MG/100ML IV SOLN
500.0000 mg | Freq: Once | INTRAVENOUS | Status: AC
  Administered 2016-09-02: 500 mg via INTRAVENOUS
  Filled 2016-09-02: qty 100

## 2016-09-02 MED ORDER — ONDANSETRON HCL 4 MG PO TABS: 4.0000 mg | ORAL_TABLET | Freq: Four times a day (QID) | ORAL | Status: DC | PRN

## 2016-09-02 MED ORDER — OXYCODONE HCL 5 MG PO TABS: 5.0000 mg | ORAL_TABLET | ORAL | Status: DC | PRN

## 2016-09-02 MED ORDER — LATANOPROST 0.005 % OP SOLN
1.0000 [drp] | Freq: Every day | OPHTHALMIC | Status: DC
  Administered 2016-09-02: 1 [drp] via OPHTHALMIC
  Filled 2016-09-02: qty 2.5

## 2016-09-02 MED ORDER — INSULIN ASPART 100 UNIT/ML ~~LOC~~ SOLN: 0.0000 [IU] | Freq: Every day | SUBCUTANEOUS | Status: DC

## 2016-09-02 MED ORDER — ACETAMINOPHEN 650 MG RE SUPP: 650.0000 mg | Freq: Four times a day (QID) | RECTAL | Status: DC | PRN

## 2016-09-02 MED ORDER — ACETAMINOPHEN 325 MG PO TABS: 650.0000 mg | ORAL_TABLET | Freq: Four times a day (QID) | ORAL | Status: DC | PRN

## 2016-09-02 MED ORDER — SODIUM CHLORIDE 0.9 % IV SOLN
INTRAVENOUS | Status: DC
  Administered 2016-09-02 – 2016-09-03 (×2): via INTRAVENOUS

## 2016-09-02 NOTE — H&P (Signed)
[facility] [place] [facility]   [facility]: [facility]    MR#:  [id number]  DATE OF BIRTH:  08/22/1919   DATE OF ADMISSION:  09/02/2016  PRIMARY CARE PHYSICIAN: Ruthe Mannanalia Aron, MD   REQUESTING/REFERRING PHYSICIAN: Huel Cotequigley  CHIEF COMPLAINT:   Chief Complaint  Patient presents with  . Hematemesis    HISTORY OF PRESENT ILLNESS:  Timothy Hays  is a 82 y.o. male with a known history of Chronic kidney disease stage III presenting after episode of vomiting. Patient had episode of vomiting last evening EMS described as coffee-ground thus brought to Hospital further workup and evaluation. He has been having some syncopal episodes recently evaluated in emergency department and sigmoid discharge. Noted to have urinary tract infection placed on ciprofloxacin at that time.  Patient denies any further symptoms other than generalized weakness and nausea vomiting as described above Patient's family at bedside  PAST MEDICAL HISTORY:   Past Medical History:  Diagnosis Date  . Renal disorder     PAST SURGICAL HISTORY:  History reviewed. No pertinent surgical history.  SOCIAL HISTORY:   Social History  Substance Use Topics  . Smoking status: Never Smoker  . Smokeless tobacco: Never Used  . Alcohol use No    FAMILY HISTORY:   Family History  Problem Relation Age of Onset  . Diabetes Neg Hx     DRUG ALLERGIES:   Allergies  Allergen Reactions  . Fish Allergy Anaphylaxis  . Keflex [Cephalexin] Nausea And Vomiting    Daughter advises that his vomiting is extremely "voilent and severe".  . Morphine And Related Nausea And Vomiting  . Penicillins     Has patient had a PCN reaction causing immediate rash, facial/tongue/throat swelling,} If all of the above answers are "NO", then may proceed with Cephalosporin use. SOB or lightheadedness with hypotension: No Has patient had a PCN reaction causing severe rash involving mucus membranes or skin necrosis:  Unknown Has patient had a PCN reaction that required hospitalization Unknown Has patient had a PCN reaction occurring within the last 10 years: No     REVIEW OF SYSTEMS:  REVIEW OF SYSTEMS:  CONSTITUTIONAL: Denies fevers, chills, Positive fatigue, weakness.  EYES: Denies blurred vision, double vision, or eye pain.  EARS, NOSE, THROAT: Denies tinnitus, ear pain, hearing loss.  RESPIRATORY: denies cough, shortness of breath, wheezing  CARDIOVASCULAR: Denies chest pain, palpitations, edema.  GASTROINTESTINAL: Positive nausea, vomiting, denies diarrhea, abdominal pain.  GENITOURINARY: Denies dysuria, hematuria.  ENDOCRINE: Denies nocturia or thyroid problems. HEMATOLOGIC AND LYMPHATIC: Denies easy bruising or bleeding.  SKIN: Denies rash or lesions.  MUSCULOSKELETAL: Denies pain in neck, back, shoulder, knees, hips, or further arthritic symptoms.  NEUROLOGIC: Denies paralysis, paresthesias.  PSYCHIATRIC: Denies anxiety or depressive symptoms. Otherwise full review of systems performed by me is negative.   MEDICATIONS AT HOME:   Prior to Admission medications   Medication Sig Start Date End Date Taking? Authorizing Provider  acetaminophen (TYLENOL) 325 MG tablet Take 650 mg by mouth every 6 (six) hours as needed.   Yes Historical Provider, MD  ciprofloxacin (CIPRO) 250 MG tablet Take 250 mg by mouth every 12 (twelve) hours.   Yes Historical Provider, MD  gabapentin (NEURONTIN) 100 MG capsule Take 100 mg by mouth 3 (three) times daily.   Yes Historical Provider, MD  latanoprost (XALATAN) 0.005 % ophthalmic solution Place 1 drop into the right eye at bedtime.   Yes Historical Provider, MD  metFORMIN (GLUCOPHAGE) 500 MG tablet Take 500  mg by mouth 2 (two) times daily.  06/10/16  Yes Historical Provider, MD  pantoprazole (PROTONIX) 40 MG tablet Take 40 mg by mouth daily.  06/10/16  Yes Historical Provider, MD      VITAL SIGNS:  Blood pressure 138/67, pulse 80, temperature 98.2 F (36.8  C), temperature source Oral, resp. rate 20, height 5\' 7"  (1.702 m), weight 99.8 kg (220 lb), SpO2 94 %.  PHYSICAL EXAMINATION:  VITAL SIGNS: Vitals:   09/02/16 1330 09/02/16 1400  BP: (!) 123/55 138/67  Pulse: 73 80  Resp: (!) 21 20  Temp:     GENERAL:82 y.o.male currently in no acute distress. Hard of hearing HEAD: Normocephalic, atraumatic.  EYES: Pupils equal, round, reactive to light. Extraocular muscles intact. No scleral icterus.  MOUTH: Moist mucosal membrane. Dentition intact. No abscess noted.  EAR, NOSE, THROAT: Clear without exudates. No external lesions.  NECK: Supple. No thyromegaly. No nodules. No JVD.  PULMONARY: Clear to ascultation, without wheeze rails or rhonci. No use of accessory muscles, Good respiratory effort. good air entry bilaterally CHEST: Nontender to palpation.  CARDIOVASCULAR: S1 and S2. Regular rate and rhythm. No murmurs, rubs, or gallops. No edema. Pedal pulses 2+ bilaterally.  GASTROINTESTINAL: Soft, nontender, nondistended. No masses. Positive bowel sounds. No hepatosplenomegaly.  MUSCULOSKELETAL: No swelling, clubbing, or edema. Range of motion full in all extremities.  NEUROLOGIC: Cranial nerves II through XII are intact. No gross focal neurological deficits. Sensation intact. Reflexes intact.  SKIN: No ulceration, lesions, rashes, or cyanosis. Skin warm and dry. Turgor intact.  PSYCHIATRIC: Mood, affect within normal limits. The patient is awake, alert and oriented x 3. Insight, judgment intact.    LABORATORY PANEL:   CBC  Recent Labs Lab 09/02/16 1036  WBC 17.4*  HGB 11.3*  HCT 34.9*  PLT 268   ------------------------------------------------------------------------------------------------------------------  Chemistries   Recent Labs Lab 09/02/16 1036  NA 138  K 4.7  CL 104  CO2 25  GLUCOSE 227*  BUN 33*  CREATININE 1.43*  CALCIUM 9.3  AST 20  ALT 10*  ALKPHOS 50  BILITOT 0.4    ------------------------------------------------------------------------------------------------------------------  Cardiac Enzymes  Recent Labs Lab 08/30/16 1752  TROPONINI <0.03   ------------------------------------------------------------------------------------------------------------------  RADIOLOGY:  Dg Chest 2 View  Result Date: 09/02/2016 CLINICAL DATA:  Vomiting today. EXAM: CHEST  2 VIEW COMPARISON:  Single-view of the chest 10/10/2013. FINDINGS: Lungs are clear. Heart size is normal. No pneumothorax or pleural effusion. IMPRESSION: No acute disease. Electronically Signed   By: Drusilla Kanner M.D.   On: 09/02/2016 14:24    EKG:   Orders placed or performed during the hospital encounter of 09/02/16  . ED EKG  . ED EKG  . EKG 12-Lead  . EKG 12-Lead    IMPRESSION AND PLAN:   82 year old Caucasian history of chronic kidney disease stage III presenting after an episode of vomiting  1.Sepsis, meeting septic criteria by leukocytosis, respiratory rate present on arrival. Source urinary tract infection  Panculture. Broad-spectrum antibiotics including Levaquin started in emergency department continue and taper antibiotics when culture data returns.   Continue IV fluid hydration to keep mean arterial pressure greater than 65. may require pressor therapy if blood pressure worsens. We will repeat lactic acid if the initial is greater than 2.2.   2. GI bleed/coffee-ground emesis: IV Protonix at this time family does not want aggressive therapy including endoscopies will follow CBC, place on clear liquids can advance diet as tolerated  3. Type 2 diabetes non-insulin-requiring hold oral agents Place on  sliding scale insulin coverage   All the records are reviewed and case discussed with ED provider. Management plans discussed with the patient, family and they are in agreement.  CODE STATUS: DO NOT RESUSCITATE  TOTAL TIME TAKING CARE OF THIS PATIENT: 33 minutes.     Cobe Viney,  Mardi MainlandDavid K M.D on 09/02/2016 at 3:17 PM  Between 7am to 6pm - Pager - [phone]  After 6pm: House Pager: [phone]  [facility] [place] [facility]  Office  [phone]  CC: Primary care physician; Ruthe Mannanalia Aron, MD

## 2016-09-02 NOTE — ED Notes (Signed)
MD Huel CoteQuigley notified of elevated lactic acid

## 2016-09-02 NOTE — Progress Notes (Signed)
   [facility] [place] [facility]   Advance care planning  Hospital Day: 0 days Timothy Hays is a 82 y.o. male presenting with Hematemesis .   Advance care planning discussed with patient  with additional Family at bedside. All questions in regards to overall condition and expected prognosis answered. The decision was made to continue current code status  CODE STATUS: dnr Time spent: 18 minutes   DNR follows with [facility]

## 2016-09-02 NOTE — ED Triage Notes (Signed)
Pt arrives via [facility] from home c/o brown emesis. EMS reports that emesis appeared to have blood in it. Pt recently here for syncopal episode. Pt alert and oriented X4, active, cooperative, pt in NAD. RR even and unlabored, color WNL.

## 2016-09-02 NOTE — ED Provider Notes (Signed)
Time Seen: Approximately 1120  I have reviewed the triage notes  Chief Complaint: Hematemesis   History of Present Illness: Timothy Hays is a 82 y.o. male who presents via EMS with a history of brown emesis with decreased appetite. The family had discussed his stay with later states he was sent home on some antibiotics from here in emergency department. He was discharged with Keflex but he was switched to Cipro by the hospice nurse. Patient was diagnosed with urinary tract infection and his urine culture was inconclusive. There's been no loose stool or diarrhea. His nausea and vomiting seemed improved after he vomited a couple times at home and denies any current nausea. His family states he has had a history of gastritis and is not unusual for him to vomit dark colored material   Past Medical History:  Diagnosis Date  . Renal disorder     Patient Active Problem List   Diagnosis Date Noted  . Sepsis (HCC) 09/02/2016    History reviewed. No pertinent surgical history.  History reviewed. No pertinent surgical history.  Current Outpatient Rx  . Order #: [id number] Class: Historical Med  . Order #: [id number] Class: Historical Med  . Order #: [id number] Class: Historical Med  . Order #: [id number] Class: Historical Med  . Order #: [id number] Class: Historical Med  . Order #: [id number] Class: Historical Med    Allergies:  Fish allergy; Keflex [cephalexin]; Morphine and related; and Penicillins  Family History: Family History  Problem Relation Age of Onset  . Diabetes Neg Hx     Social History: Social History  Substance Use Topics  . Smoking status: Never Smoker  . Smokeless tobacco: Never Used  . Alcohol use No     Review of Systems:   10 point review of systems was performed and was otherwise negative: Review of system acquired from the patient along with the medical record Constitutional: No fever Eyes: No visual disturbances ENT: No sore throat, ear pain Cardiac: No  chest pain Respiratory: No shortness of breath, wheezing, or stridor Abdomen: No abdominal pain, no vomiting, No diarrhea Endocrine: No weight loss, No night sweats Extremities: No peripheral edema, cyanosis Skin: No rashes, easy bruising Neurologic: No focal weakness, trouble with speech or swollowing Urologic: No dysuria, Hematuria, or urinary frequency   Physical Exam:  ED Triage Vitals  Enc Vitals Group     BP 09/02/16 1034 133/66     Pulse Rate 09/02/16 1034 99     Resp 09/02/16 1034 18     Temp 09/02/16 1034 98.2 F (36.8 C)     Temp Source 09/02/16 1034 Oral     SpO2 09/02/16 1034 91 %     Weight 09/02/16 1035 220 lb (99.8 kg)     Height 09/02/16 1035 5\' 7"  (1.702 m)     Head Circumference --      Peak Flow --      Pain Score 09/02/16 1035 0     Pain Loc --      Pain Edu? --      Excl. in GC? --     General: Awake , Alert , and Oriented times 3; GCS 15 Head: Normal cephalic , atraumatic Eyes: Pupils equal , round, reactive to light Nose/Throat: No nasal drainage, patent upper airway without erythema or exudate.  Neck: Supple, Full range of motion, No anterior adenopathy or palpable thyroid masses Lungs: Clear to ascultation without wheezes , rhonchi, or rales Heart: Regular rate, regular rhythm without murmurs , gallops , or  rubs Abdomen: Soft, non tender without rebound, guarding , or rigidity; bowel sounds positive and symmetric in all 4 quadrants. No organomegaly .        Extremities: 2 plus symmetric pulses. No edema, clubbing or cyanosis Neurologic: normal ambulation, Motor symmetric without deficits, sensory intact Skin: warm, dry, no rashes Rectal exam with chaperone present was guaiac negative with normal sphincter tone  Labs:   All laboratory work was reviewed including any pertinent negatives or positives listed below:  Labs Reviewed  COMPREHENSIVE METABOLIC PANEL - Abnormal; Notable for the following:       Result Value   Glucose, Bld 227 (*)     BUN 33 (*)    Creatinine, Ser 1.43 (*)    ALT 10 (*)    GFR calc non Af Amer 40 (*)    GFR calc Af Amer 46 (*)    All other components within normal limits  CBC - Abnormal; Notable for the following:    WBC 17.4 (*)    RBC 3.90 (*)    Hemoglobin 11.3 (*)    HCT 34.9 (*)    RDW 14.8 (*)    All other components within normal limits  URINALYSIS, COMPLETE (UACMP) WITH MICROSCOPIC - Abnormal; Notable for the following:    Color, Urine YELLOW (*)    APPearance HAZY (*)    Glucose, UA 50 (*)    Protein, ur 30 (*)    Leukocytes, UA LARGE (*)    Bacteria, UA RARE (*)    Squamous Epithelial / LPF 0-5 (*)    All other components within normal limits  LACTIC ACID, PLASMA - Abnormal; Notable for the following:    Lactic Acid, Venous 2.6 (*)    All other components within normal limits  CULTURE, BLOOD (ROUTINE X 2)  CULTURE, BLOOD (ROUTINE X 2)  URINE CULTURE  LIPASE, BLOOD  LACTIC ACID, PLASMA  Patient appears to have 5 and an elevated lactic acid and still has findings consistent with a urinary tract infection  EKG: * ED ECG REPORT I, Jennye MoccasinBrian S Quigley, the attending physician, personally viewed and interpreted this ECG.  Date: 09/02/2016 EKG Time: *1202 Rate: 84 Rhythm: normal sinus rhythm QRS Axis: normal Intervals: Early right bundle branch block ST/T Wave abnormalities: normal Conduction Disturbances: none Narrative Interpretation: unremarkable No acute ischemic changes   Radiology: * "Dg Chest 2 View  Result Date: 09/02/2016 CLINICAL DATA:  Vomiting today. EXAM: CHEST  2 VIEW COMPARISON:  Single-view of the chest 10/10/2013. FINDINGS: Lungs are clear. Heart size is normal. No pneumothorax or pleural effusion. IMPRESSION: No acute disease. Electronically Signed   By: Drusilla Kannerhomas  Dalessio M.D.   On: 09/02/2016 14:24  "  I personally reviewed the radiologic studies    ED Course:  Patient was treated for what appears to be some early urosepsis. He is currently  hemodynamically stable and I felt IV fluid boluses for sepsis were not necessary at this time. She'll be started on IV Levaquin. Urine culture and blood cultures 2 are pending at this time. I felt was unlikely the patient had an active gastrointestinal bleed at this time Clinical Course      Assessment:  Urosepsis      Plan: Inpatient            Jennye MoccasinBrian S Quigley, MD 09/02/16 [phone]

## 2016-09-03 LAB — URINE CULTURE: CULTURE: NO GROWTH

## 2016-09-03 LAB — CBC
HEMATOCRIT: 28.1 % — AB (ref 40.0–52.0)
HEMOGLOBIN: 9.1 g/dL — AB (ref 13.0–18.0)
MCH: 29.1 pg (ref 26.0–34.0)
MCHC: 32.5 g/dL (ref 32.0–36.0)
MCV: 89.5 fL (ref 80.0–100.0)
Platelets: 209 10*3/uL (ref 150–440)
RBC: 3.14 MIL/uL — ABNORMAL LOW (ref 4.40–5.90)
RDW: 15 % — AB (ref 11.5–14.5)
WBC: 9.5 10*3/uL (ref 3.8–10.6)

## 2016-09-03 LAB — GLUCOSE, CAPILLARY
GLUCOSE-CAPILLARY: 127 mg/dL — AB (ref 65–99)
GLUCOSE-CAPILLARY: 160 mg/dL — AB (ref 65–99)
Glucose-Capillary: 155 mg/dL — ABNORMAL HIGH (ref 65–99)

## 2016-09-03 LAB — HEMOGLOBIN A1C
Hgb A1c MFr Bld: 6.5 % — ABNORMAL HIGH (ref 4.8–5.6)
Mean Plasma Glucose: 140 mg/dL

## 2016-09-03 LAB — BASIC METABOLIC PANEL
ANION GAP: 6 (ref 5–15)
BUN: 32 mg/dL — AB (ref 6–20)
CO2: 26 mmol/L (ref 22–32)
Calcium: 8.4 mg/dL — ABNORMAL LOW (ref 8.9–10.3)
Chloride: 106 mmol/L (ref 101–111)
Creatinine, Ser: 1.2 mg/dL (ref 0.61–1.24)
GFR calc Af Amer: 57 mL/min — ABNORMAL LOW (ref >60)
GFR calc non Af Amer: 50 mL/min — ABNORMAL LOW (ref >60)
GLUCOSE: 136 mg/dL — AB (ref 65–99)
POTASSIUM: 4.3 mmol/L (ref 3.5–5.1)
Sodium: 138 mmol/L (ref 135–145)

## 2016-09-03 MED ORDER — LEVOFLOXACIN 500 MG PO TABS: 500.0000 mg | ORAL_TABLET | Freq: Every day | ORAL | 0 refills | Status: DC

## 2016-09-03 NOTE — Progress Notes (Signed)
CH rounding the unit visited the Pt, who was with a Nurse in the Rm. Pt had a hearing problem and asked CH to talk to him on the left ear. Pt spoke about the loss of his wife, who passed away 4 yrs. ago; Pt talked about his struggles with mobility at home, and was excited about the possibility of going home today. Pt [facility]   09/03/16 1500  Clinical Encounter Type  Visited With Patient  Visit Type Initial;Spiritual support  Referral From Nurse  Consult/Referral To Chaplain  Spiritual Encounters  Spiritual Needs Prayer;Emotional

## 2016-09-03 NOTE — Discharge Summary (Signed)
[employer] - [place] at [facility]   PATIENT NAME: Timothy Hays    MR#:  [id number]  DATE OF BIRTH:  08-Jul-1919  DATE OF ADMISSION:  09/02/2016 ADMITTING PHYSICIAN: Wyatt Haste, MD  DATE OF DISCHARGE: 09/03/16  PRIMARY CARE PHYSICIAN: No primary care provider on file.    ADMISSION DIAGNOSIS:  Hematemesis  DISCHARGE DIAGNOSIS:  Active Problems:   Sepsis (HCC) hematemsis, uti unspecified  SECONDARY DIAGNOSIS:   Past Medical History:  Diagnosis Date  . Renal disorder     HOSPITAL COURSE:  Timothy Hays  is a 82 y.o. male admitted 09/02/2016 with chief complaint Hematemesis . Please see H&P performed by Wyatt Haste, MD for further information. Patient presented after having 1 episode coffee ground emesis. Also noted to have elevated wbc, with increased respiratory rate meeting septic criteria. CXR normal, Ua positive for UTI. Placed on levaquin - improvement of weakness, and wbc.  No further episodes of emesis, family not wanting aggressive measures   DISCHARGE CONDITIONS:   stable  CONSULTS OBTAINED:  Treatment Team:  Wyatt Haste, MD  DRUG ALLERGIES:   Allergies  Allergen Reactions  . Fish Allergy Anaphylaxis  . Keflex [Cephalexin] Nausea And Vomiting    Daughter advises that his vomiting is extremely "voilent and severe".  . Morphine And Related Nausea And Vomiting  . Penicillins     Has patient had a PCN reaction causing immediate rash, facial/tongue/throat swelling,} If all of the above answers are "NO", then may proceed with Cephalosporin use. SOB or lightheadedness with hypotension: No Has patient had a PCN reaction causing severe rash involving mucus membranes or skin necrosis: Unknown Has patient had a PCN reaction that required hospitalization Unknown Has patient had a PCN reaction occurring within the last 10 years: No     DISCHARGE MEDICATIONS:   Current Discharge Medication List    START taking these medications   Details  levofloxacin (LEVAQUIN) 500 MG tablet Take 1 tablet (500 mg total) by mouth daily. Qty: 5 tablet, Refills: 0      CONTINUE these medications which have NOT CHANGED   Details  acetaminophen (TYLENOL) 325 MG tablet Take 650 mg by mouth every 6 (six) hours as needed.    gabapentin (NEURONTIN) 100 MG capsule Take 100 mg by mouth 3 (three) times daily.    latanoprost (XALATAN) 0.005 % ophthalmic solution Place 1 drop into the right eye at bedtime.    metFORMIN (GLUCOPHAGE) 500 MG tablet Take 500 mg by mouth 2 (two) times daily.     pantoprazole (PROTONIX) 40 MG tablet Take 40 mg by mouth daily.       STOP taking these medications     ciprofloxacin (CIPRO) 250 MG tablet          DISCHARGE INSTRUCTIONS:    DIET:  Regular diet  DISCHARGE CONDITION:  Stable  ACTIVITY:  Activity as tolerated  OXYGEN:  Home Oxygen: No.   Oxygen Delivery: room air  DISCHARGE LOCATION:  home   If you experience worsening of your admission symptoms, develop shortness of breath, life threatening emergency, suicidal or homicidal thoughts you must seek medical attention immediately by calling 911 or calling your MD immediately  if symptoms less severe.  You Must read complete instructions/literature along with all the possible adverse reactions/side effects for all the Medicines you take and that have been prescribed to you. Take any new Medicines after you have completely understood and accpet all the possible adverse reactions/side effects.  Please note  You were cared for by a hospitalist during your hospital stay. If you have any questions about your discharge medications or the care you received while you were in the hospital after you are discharged, you can call the unit and asked to speak with the hospitalist on call if the hospitalist that took care of you is not available. Once you are discharged, your primary care physician will handle any further medical issues. Please note  that NO REFILLS for any discharge medications will be authorized once you are discharged, as it is imperative that you return to your primary care physician (or establish a relationship with a primary care physician if you do not have one) for your aftercare needs so that they can reassess your need for medications and monitor your lab values.    On the day of Discharge:   VITAL SIGNS:  Blood pressure (!) 120/55, pulse 69, temperature 98.2 F (36.8 C), temperature source Oral, resp. rate 17, height 5\' 7"  (1.702 m), weight 99.8 kg (220 lb), SpO2 96 %.  I/O:   Intake/Output Summary (Last 24 hours) at 09/03/16 1329 Last data filed at 09/03/16 0948  Gross per 24 hour  Intake             1845 ml  Output              325 ml  Net             1520 ml    PHYSICAL EXAMINATION:  GENERAL:  82 y.o.-year-old patient lying in the bed with no acute distress.  EYES: Pupils equal, round, reactive to light and accommodation. No scleral icterus. Extraocular muscles intact.  HEENT: Head atraumatic, normocephalic. Oropharynx and nasopharynx clear.  NECK:  Supple, no jugular venous distention. No thyroid enlargement, no tenderness.  LUNGS: Normal breath sounds bilaterally, no wheezing, rales,rhonchi or crepitation. No use of accessory muscles of respiration.  CARDIOVASCULAR: S1, S2 normal. No murmurs, rubs, or gallops.  ABDOMEN: Soft, non-tender, non-distended. Bowel sounds present. No organomegaly or mass.  EXTREMITIES: No pedal edema, cyanosis, or clubbing.  NEUROLOGIC: Cranial nerves II through XII are intact. Muscle strength 5/5 in all extremities. Sensation intact. Gait not checked.  PSYCHIATRIC: The patient is alert and oriented x 3.  SKIN: No obvious rash, lesion, or ulcer.   DATA REVIEW:   CBC  Recent Labs Lab 09/03/16 0440  WBC 9.5  HGB 9.1*  HCT 28.1*  PLT 209    Chemistries   Recent Labs Lab 09/02/16 1036 09/03/16 0440  NA 138 138  K 4.7 4.3  CL 104 106  CO2 25 26    GLUCOSE 227* 136*  BUN 33* 32*  CREATININE 1.43* 1.20  CALCIUM 9.3 8.4*  AST 20  --   ALT 10*  --   ALKPHOS 50  --   BILITOT 0.4  --     Cardiac Enzymes  Recent Labs Lab 08/30/16 1752  TROPONINI <0.03    Microbiology Results  Results for orders placed or performed during the hospital encounter of 09/02/16  Culture, blood (Routine X 2) w Reflex to ID Panel     Status: None (Preliminary result)   Collection Time: 09/02/16 12:00 PM  Result Value Ref Range Status   Spec[facility]  Final   Special Requests   Final    BOTTLES DRAWN AEROBIC AND ANAEROBIC AER 12ML ANA 10ML   Culture NO GROWTH < 24 HOURS  Final   Report Status  PENDING  Incomplete  Culture, blood (Routine X 2) w Reflex to ID Panel     Status: None (Preliminary result)   Collection Time: 09/02/16 12:00 PM  Result Value Ref Range Status   Specimen Description [facility]  Final   Special Requests   Final    BOTTLES DRAWN AEROBIC AND ANAEROBIC AER 12ML ANA 18ML   Culture NO GROWTH < 24 HOURS  Final   Report Status PENDING  Incomplete    RADIOLOGY:  Dg Chest 2 View  Result Date: 09/02/2016 CLINICAL DATA:  Vomiting today. EXAM: CHEST  2 VIEW COMPARISON:  Single-view of the chest 10/10/2013. FINDINGS: Lungs are clear. Heart size is normal. No pneumothorax or pleural effusion. IMPRESSION: No acute disease. Electronically Signed   By: Drusilla Kannerhomas  Dalessio M.D.   On: 09/02/2016 14:24     Management plans discussed with the patient, family and they are in agreement.  CODE STATUS:     Code Status Orders        Start     Ordered   09/02/16 1517  Do not attempt resuscitation (DNR)  Continuous    Question Answer Comment  In the event of cardiac or respiratory ARREST Do not call a "code blue"   In the event of cardiac or respiratory ARREST Do not perform Intubation, CPR, defibrillation or ACLS   In the event of cardiac or respiratory ARREST Use medication by any route, position, wound  care, and other measures to relive pain and suffering. May use oxygen, suction and manual treatment of airway obstruction as needed for comfort.      09/02/16 1516    Code Status History    Date Active Date Inactive Code Status Order ID Comments User Context   09/02/2016  3:00 PM 09/02/2016  3:15 PM Full Code [id number]  Wyatt Hasteavid K Marialena Wollen, MD ED    Advance Directive Documentation   Flowsheet Row Most Recent Value  Type of Advance Directive  Healthcare Power of Attorney  Pre-existing out of facility DNR order (yellow form or pink MOST form)  No data  "MOST" Form in Place?  No data      TOTAL TIME TAKING CARE OF THIS PATIENT: 33 minutes.    Dalin Caldera,  Mardi MainlandDavid K M.D on 09/03/2016 at 1:29 PM  Between 7am to 6pm - Pager - [phone]  After 6pm go to [email or web address] - Scientist, research (life sciences)password [email or web address]  [facility] [place] [facility]  Office  [phone]  CC: Primary care physician; No primary care provider on file.

## 2016-09-03 NOTE — Progress Notes (Signed)
Pt alet but speeck is not real clear. 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 CH is available.   09/03/16 1050  Clinical Encounter Type  Visited With Patient  Visit Type Initial  Referral From Nurse  Spiritual Encounters  Spiritual Needs Emotional  Stress Factors  Patient Stress Factors None identified

## 2016-09-03 NOTE — Progress Notes (Signed)
Patient A&O, VSS.  No complaints of pain.  Discharge instructions reviewed with patient and daughter.  Understanding was verbalized and all questions answered.  Patient discharged home via EMS in stable condition.  Currently awaiting EMS transport.

## 2016-09-03 NOTE — Care Management (Signed)
Patient to discharge home today with [facility] [facility]  Patient to transport home via EMS.  Clydie BraunKaren with hospice notified of discharge.

## 2016-09-03 NOTE — Progress Notes (Signed)
Visit made. [name] currently followed by [facility] [place] at home with a  Hospice diagnosis of hypertensive heart and kidney disease. He is a DNR code with out of facility DNR in place. He was admitted to [facility] on 12/;26 for evaluation of hematemesis and is currently being treated for  urinary tract infection with IV antibiotics. Patient seen sitting up in bed, alert and oriented, denied pain, requesting to go home today. No family present at bedside. Staff RN Beth in during visit, patient able to swallow oral medications with out difficulty. Transfer summary placed in hospital cahrt. Updated notes faxed to triage. Will continue to follow through final disposition. Dayna BarkerKaren Robertson RN, BSN,CHPN [facility] [place], [facility] lia[facility] [phone] C

## 2016-09-03 NOTE — Progress Notes (Signed)
PT Cancellation Note  Patient Details Name: Timothy Hays MRN: [id number] DOB: 12/24/1918   Cancelled Treatment:    Reason Eval/Treat Not Completed: Other (comment) (Consult received and chart reviewed.  Order for discharge with continued hospice services noted.  Discussed with primary RN, CSW and attending MD-patient without PT needs prior to discharge.  Okay to discontinue PT order at this time.  Please re-consult should needs/status change. )   [name], PT, DPT, NCS 09/03/16, 3:25 PM [phone]

## 2016-09-07 LAB — CULTURE, BLOOD (ROUTINE X 2)
Culture: NO GROWTH
Culture: NO GROWTH

## 2016-12-18 ENCOUNTER — Inpatient Hospital Stay
Admission: EM | Admit: 2016-12-18 | Discharge: 2016-12-23 | DRG: 291 | Disposition: A | Discharge status: Home / Self Care | Payer: Medicare Other | Attending: Internal Medicine | Admitting: Internal Medicine

## 2016-12-18 ENCOUNTER — Inpatient Hospital Stay
Admit: 2016-12-18 | Discharge: 2016-12-18 | Disposition: A | Discharge status: Home / Self Care | Payer: Medicare Other | Attending: Internal Medicine | Admitting: Internal Medicine

## 2016-12-18 ENCOUNTER — Encounter: Payer: Self-pay | Admitting: Emergency Medicine

## 2016-12-18 ENCOUNTER — Emergency Department: Payer: Medicare Other

## 2016-12-18 DIAGNOSIS — Z91013 Allergy to seafood: Secondary | ICD-10-CM | POA: Diagnosis not present

## 2016-12-18 DIAGNOSIS — J441 Chronic obstructive pulmonary disease with (acute) exacerbation: Secondary | ICD-10-CM | POA: Diagnosis present

## 2016-12-18 DIAGNOSIS — E1151 Type 2 diabetes mellitus with diabetic peripheral angiopathy without gangrene: Secondary | ICD-10-CM | POA: Diagnosis present

## 2016-12-18 DIAGNOSIS — I5021 Acute systolic (congestive) heart failure: Secondary | ICD-10-CM | POA: Diagnosis not present

## 2016-12-18 DIAGNOSIS — R609 Edema, unspecified: Secondary | ICD-10-CM

## 2016-12-18 DIAGNOSIS — D631 Anemia in chronic kidney disease: Secondary | ICD-10-CM | POA: Diagnosis present

## 2016-12-18 DIAGNOSIS — Z880 Allergy status to penicillin: Secondary | ICD-10-CM

## 2016-12-18 DIAGNOSIS — I252 Old myocardial infarction: Secondary | ICD-10-CM

## 2016-12-18 DIAGNOSIS — J9601 Acute respiratory failure with hypoxia: Secondary | ICD-10-CM | POA: Diagnosis present

## 2016-12-18 DIAGNOSIS — I2510 Atherosclerotic heart disease of native coronary artery without angina pectoris: Secondary | ICD-10-CM | POA: Diagnosis present

## 2016-12-18 DIAGNOSIS — I452 Bifascicular block: Secondary | ICD-10-CM | POA: Diagnosis present

## 2016-12-18 DIAGNOSIS — E875 Hyperkalemia: Secondary | ICD-10-CM | POA: Diagnosis present

## 2016-12-18 DIAGNOSIS — E669 Obesity, unspecified: Secondary | ICD-10-CM | POA: Diagnosis present

## 2016-12-18 DIAGNOSIS — I509 Heart failure, unspecified: Secondary | ICD-10-CM | POA: Diagnosis present

## 2016-12-18 DIAGNOSIS — N183 Chronic kidney disease, stage 3 (moderate): Secondary | ICD-10-CM | POA: Diagnosis present

## 2016-12-18 DIAGNOSIS — Z66 Do not resuscitate: Secondary | ICD-10-CM | POA: Diagnosis present

## 2016-12-18 DIAGNOSIS — Z7984 Long term (current) use of oral hypoglycemic drugs: Secondary | ICD-10-CM

## 2016-12-18 DIAGNOSIS — I248 Other forms of acute ischemic heart disease: Secondary | ICD-10-CM | POA: Diagnosis present

## 2016-12-18 DIAGNOSIS — E785 Hyperlipidemia, unspecified: Secondary | ICD-10-CM | POA: Diagnosis present

## 2016-12-18 DIAGNOSIS — M109 Gout, unspecified: Secondary | ICD-10-CM | POA: Diagnosis present

## 2016-12-18 DIAGNOSIS — N390 Urinary tract infection, site not specified: Secondary | ICD-10-CM | POA: Diagnosis present

## 2016-12-18 DIAGNOSIS — E871 Hypo-osmolality and hyponatremia: Secondary | ICD-10-CM | POA: Diagnosis present

## 2016-12-18 DIAGNOSIS — E1165 Type 2 diabetes mellitus with hyperglycemia: Secondary | ICD-10-CM | POA: Diagnosis present

## 2016-12-18 DIAGNOSIS — E876 Hypokalemia: Secondary | ICD-10-CM | POA: Diagnosis present

## 2016-12-18 DIAGNOSIS — Z8249 Family history of ischemic heart disease and other diseases of the circulatory system: Secondary | ICD-10-CM

## 2016-12-18 DIAGNOSIS — I130 Hypertensive heart and chronic kidney disease with heart failure and stage 1 through stage 4 chronic kidney disease, or unspecified chronic kidney disease: Principal | ICD-10-CM | POA: Diagnosis present

## 2016-12-18 DIAGNOSIS — Z6834 Body mass index (BMI) 34.0-34.9, adult: Secondary | ICD-10-CM

## 2016-12-18 DIAGNOSIS — Z79899 Other long term (current) drug therapy: Secondary | ICD-10-CM | POA: Diagnosis not present

## 2016-12-18 DIAGNOSIS — N179 Acute kidney failure, unspecified: Secondary | ICD-10-CM

## 2016-12-18 DIAGNOSIS — E1122 Type 2 diabetes mellitus with diabetic chronic kidney disease: Secondary | ICD-10-CM | POA: Diagnosis present

## 2016-12-18 DIAGNOSIS — R0602 Shortness of breath: Secondary | ICD-10-CM | POA: Diagnosis present

## 2016-12-18 DIAGNOSIS — Z881 Allergy status to other antibiotic agents status: Secondary | ICD-10-CM

## 2016-12-18 DIAGNOSIS — J9801 Acute bronchospasm: Secondary | ICD-10-CM | POA: Diagnosis present

## 2016-12-18 DIAGNOSIS — K219 Gastro-esophageal reflux disease without esophagitis: Secondary | ICD-10-CM | POA: Diagnosis present

## 2016-12-18 DIAGNOSIS — R7989 Other specified abnormal findings of blood chemistry: Secondary | ICD-10-CM

## 2016-12-18 DIAGNOSIS — Z885 Allergy status to narcotic agent status: Secondary | ICD-10-CM

## 2016-12-18 DIAGNOSIS — R778 Other specified abnormalities of plasma proteins: Secondary | ICD-10-CM

## 2016-12-18 DIAGNOSIS — J811 Chronic pulmonary edema: Secondary | ICD-10-CM

## 2016-12-18 DIAGNOSIS — J810 Acute pulmonary edema: Secondary | ICD-10-CM

## 2016-12-18 HISTORY — DX: Angina pectoris, unspecified: I20.9

## 2016-12-18 HISTORY — DX: Personal history of other diseases of the musculoskeletal system and connective tissue: Z87.39

## 2016-12-18 HISTORY — DX: Dyspnea, unspecified: R06.00

## 2016-12-18 HISTORY — DX: Acute myocardial infarction, unspecified: I21.9

## 2016-12-18 HISTORY — DX: Essential (primary) hypertension: I10

## 2016-12-18 HISTORY — DX: Chronic kidney disease, stage 3 (moderate): N18.3

## 2016-12-18 HISTORY — DX: Atherosclerotic heart disease of native coronary artery without angina pectoris: I25.10

## 2016-12-18 HISTORY — DX: Hyperlipidemia, unspecified: E78.5

## 2016-12-18 HISTORY — DX: Type 2 diabetes mellitus without complications: E11.9

## 2016-12-18 HISTORY — DX: Peripheral vascular disease, unspecified: I73.9

## 2016-12-18 HISTORY — DX: Chronic kidney disease, stage 3 unspecified: N18.30

## 2016-12-18 HISTORY — DX: Gastro-esophageal reflux disease without esophagitis: K21.9

## 2016-12-18 LAB — URINALYSIS, ROUTINE W REFLEX MICROSCOPIC
Bilirubin Urine: NEGATIVE
GLUCOSE, UA: NEGATIVE mg/dL
Ketones, ur: NEGATIVE mg/dL
NITRITE: NEGATIVE
Protein, ur: NEGATIVE mg/dL
SPECIFIC GRAVITY, URINE: 1.011 (ref 1.005–1.030)
SQUAMOUS EPITHELIAL / LPF: NONE SEEN
pH: 5 (ref 5.0–8.0)

## 2016-12-18 LAB — ECHOCARDIOGRAM COMPLETE
Height: 66 in
WEIGHTICAEL: 3326.3 [oz_av]

## 2016-12-18 LAB — CBC WITH DIFFERENTIAL/PLATELET
Basophils Absolute: 0 10*3/uL (ref 0–0.1)
Basophils Relative: 0 %
EOS PCT: 0 %
Eosinophils Absolute: 0 10*3/uL (ref 0–0.7)
HEMATOCRIT: 27 % — AB (ref 40.0–52.0)
Hemoglobin: 8.4 g/dL — ABNORMAL LOW (ref 13.0–18.0)
Lymphocytes Relative: 7 %
Lymphs Abs: 0.5 10*3/uL — ABNORMAL LOW (ref 1.0–3.6)
MCH: 24.6 pg — AB (ref 26.0–34.0)
MCHC: 31.1 g/dL — AB (ref 32.0–36.0)
MCV: 79.3 fL — AB (ref 80.0–100.0)
Monocytes Absolute: 0.5 10*3/uL (ref 0.2–1.0)
Monocytes Relative: 7 %
NEUTROS PCT: 86 %
Neutro Abs: 5.9 10*3/uL (ref 1.4–6.5)
PLATELETS: 235 10*3/uL (ref 150–440)
RBC: 3.4 MIL/uL — ABNORMAL LOW (ref 4.40–5.90)
RDW: 17 % — AB (ref 11.5–14.5)
WBC: 6.8 10*3/uL (ref 3.8–10.6)

## 2016-12-18 LAB — BLOOD GAS, VENOUS
Acid-base deficit: 1.5 mmol/L (ref 0.0–2.0)
Bicarbonate: 24.7 mmol/L (ref 20.0–28.0)
O2 SAT: 77.6 %
PATIENT TEMPERATURE: 37
pCO2, Ven: 48 mmHg (ref 44.0–60.0)
pH, Ven: 7.32 (ref 7.250–7.430)
pO2, Ven: 46 mmHg — ABNORMAL HIGH (ref 32.0–45.0)

## 2016-12-18 LAB — CBC
HEMATOCRIT: 28.1 % — AB (ref 40.0–52.0)
Hemoglobin: 8.8 g/dL — ABNORMAL LOW (ref 13.0–18.0)
MCH: 25.1 pg — ABNORMAL LOW (ref 26.0–34.0)
MCHC: 31.2 g/dL — ABNORMAL LOW (ref 32.0–36.0)
MCV: 80.6 fL (ref 80.0–100.0)
Platelets: 227 10*3/uL (ref 150–440)
RBC: 3.48 MIL/uL — AB (ref 4.40–5.90)
RDW: 16.6 % — ABNORMAL HIGH (ref 11.5–14.5)
WBC: 5.8 10*3/uL (ref 3.8–10.6)

## 2016-12-18 LAB — LACTIC ACID, PLASMA
LACTIC ACID, VENOUS: 1.9 mmol/L (ref 0.5–1.9)
Lactic Acid, Venous: 5.1 mmol/L (ref 0.5–1.9)

## 2016-12-18 LAB — CREATININE, SERUM
Creatinine, Ser: 2.12 mg/dL — ABNORMAL HIGH (ref 0.61–1.24)
GFR calc non Af Amer: 25 mL/min — ABNORMAL LOW (ref >60)
GFR, EST AFRICAN AMERICAN: 29 mL/min — AB (ref >60)

## 2016-12-18 LAB — TROPONIN I
TROPONIN I: 0.19 ng/mL — AB (ref <0.03)
TROPONIN I: 0.39 ng/mL — AB (ref <0.03)
TROPONIN I: 0.87 ng/mL — AB (ref <0.03)
Troponin I: 1.16 ng/mL (ref <0.03)
Troponin I: 1.04 ng/mL (ref <0.03)

## 2016-12-18 LAB — COMPREHENSIVE METABOLIC PANEL
ALT: 15 U/L — ABNORMAL LOW (ref 17–63)
ANION GAP: 8 (ref 5–15)
AST: 26 U/L (ref 15–41)
Albumin: 3.2 g/dL — ABNORMAL LOW (ref 3.5–5.0)
Alkaline Phosphatase: 67 U/L (ref 38–126)
BILIRUBIN TOTAL: 0.4 mg/dL (ref 0.3–1.2)
BUN: 36 mg/dL — ABNORMAL HIGH (ref 6–20)
CO2: 25 mmol/L (ref 22–32)
Calcium: 8.3 mg/dL — ABNORMAL LOW (ref 8.9–10.3)
Chloride: 101 mmol/L (ref 101–111)
Creatinine, Ser: 2.08 mg/dL — ABNORMAL HIGH (ref 0.61–1.24)
GFR, EST AFRICAN AMERICAN: 30 mL/min — AB (ref >60)
GFR, EST NON AFRICAN AMERICAN: 25 mL/min — AB (ref >60)
Glucose, Bld: 178 mg/dL — ABNORMAL HIGH (ref 65–99)
Potassium: 6.4 mmol/L (ref 3.5–5.1)
Sodium: 134 mmol/L — ABNORMAL LOW (ref 135–145)
Total Protein: 7.1 g/dL (ref 6.5–8.1)

## 2016-12-18 LAB — INFLUENZA PANEL BY PCR (TYPE A & B)
Influenza A By PCR: NEGATIVE
Influenza B By PCR: NEGATIVE

## 2016-12-18 LAB — GLUCOSE, CAPILLARY
GLUCOSE-CAPILLARY: 158 mg/dL — AB (ref 65–99)
GLUCOSE-CAPILLARY: 182 mg/dL — AB (ref 65–99)
Glucose-Capillary: 158 mg/dL — ABNORMAL HIGH (ref 65–99)
Glucose-Capillary: 149 mg/dL — ABNORMAL HIGH (ref 65–99)
Glucose-Capillary: 188 mg/dL — ABNORMAL HIGH (ref 65–99)

## 2016-12-18 LAB — BRAIN NATRIURETIC PEPTIDE: B Natriuretic Peptide: 522 pg/mL — ABNORMAL HIGH (ref 0.0–100.0)

## 2016-12-18 LAB — POTASSIUM: POTASSIUM: 4.9 mmol/L (ref 3.5–5.1)

## 2016-12-18 LAB — APTT: APTT: 111 s — AB (ref 24–36)

## 2016-12-18 LAB — PROTIME-INR
INR: 1.17
Prothrombin Time: 15 seconds (ref 11.4–15.2)

## 2016-12-18 LAB — MRSA PCR SCREENING: MRSA by PCR: NEGATIVE

## 2016-12-18 LAB — PROCALCITONIN: Procalcitonin: 0.1 ng/mL

## 2016-12-18 MED ORDER — PANTOPRAZOLE SODIUM 40 MG PO TBEC
40.0000 mg | DELAYED_RELEASE_TABLET | Freq: Every day | ORAL | Status: DC
  Administered 2016-12-18 – 2016-12-23 (×6): 40 mg via ORAL
  Filled 2016-12-18 (×6): qty 1

## 2016-12-18 MED ORDER — DEXTROSE 5 % IV SOLN
1.0000 g | INTRAVENOUS | Status: DC
  Administered 2016-12-19 – 2016-12-22 (×4): 1 g via INTRAVENOUS
  Filled 2016-12-18 (×5): qty 10

## 2016-12-18 MED ORDER — HEPARIN SODIUM (PORCINE) 5000 UNIT/ML IJ SOLN
5000.0000 [IU] | Freq: Three times a day (TID) | INTRAMUSCULAR | Status: DC
Start: 2016-12-18 — End: 2016-12-18

## 2016-12-18 MED ORDER — CEFTRIAXONE SODIUM 1 G IJ SOLR: 1.0000 g | INTRAMUSCULAR | Status: DC

## 2016-12-18 MED ORDER — DOCUSATE SODIUM 100 MG PO CAPS: 100.0000 mg | ORAL_CAPSULE | Freq: Two times a day (BID) | ORAL | Status: DC | PRN

## 2016-12-18 MED ORDER — DEXTROSE 50 % IV SOLN
1.0000 | Freq: Once | INTRAVENOUS | Status: AC
  Administered 2016-12-18: 50 mL via INTRAVENOUS
  Filled 2016-12-18: qty 50

## 2016-12-18 MED ORDER — ALBUTEROL SULFATE (2.5 MG/3ML) 0.083% IN NEBU
10.0000 mg | INHALATION_SOLUTION | Freq: Once | RESPIRATORY_TRACT | Status: AC
  Administered 2016-12-18: 10 mg via RESPIRATORY_TRACT
  Filled 2016-12-18: qty 12

## 2016-12-18 MED ORDER — DEXTROSE 5 % IV SOLN
1.0000 g | Freq: Once | INTRAVENOUS | Status: AC
  Administered 2016-12-18 (×2): 1 g via INTRAVENOUS
  Filled 2016-12-18: qty 10

## 2016-12-18 MED ORDER — LATANOPROST 0.005 % OP SOLN
1.0000 [drp] | Freq: Every day | OPHTHALMIC | Status: DC
  Administered 2016-12-19 – 2016-12-22 (×3): 1 [drp] via OPHTHALMIC
  Filled 2016-12-18 (×3): qty 2.5

## 2016-12-18 MED ORDER — SODIUM CHLORIDE 0.9% FLUSH
3.0000 mL | Freq: Two times a day (BID) | INTRAVENOUS | Status: DC
  Administered 2016-12-18 – 2016-12-23 (×10): 3 mL via INTRAVENOUS

## 2016-12-18 MED ORDER — GUAIFENESIN-DM 100-10 MG/5ML PO SYRP: 10.0000 mL | ORAL_SOLUTION | ORAL | Status: DC | PRN

## 2016-12-18 MED ORDER — ALBUTEROL SULFATE (2.5 MG/3ML) 0.083% IN NEBU
2.5000 mg | INHALATION_SOLUTION | RESPIRATORY_TRACT | Status: DC | PRN
Start: 2016-12-18 — End: 2016-12-23

## 2016-12-18 MED ORDER — SODIUM POLYSTYRENE SULFONATE 15 GM/60ML PO SUSP
30.0000 g | Freq: Once | ORAL | Status: AC
  Administered 2016-12-18: 30 g via ORAL
  Filled 2016-12-18: qty 120

## 2016-12-18 MED ORDER — PANTOPRAZOLE SODIUM 40 MG PO TBEC: 40.0000 mg | DELAYED_RELEASE_TABLET | Freq: Every day | ORAL | Status: DC

## 2016-12-18 MED ORDER — INSULIN ASPART 100 UNIT/ML ~~LOC~~ SOLN
0.0000 [IU] | Freq: Three times a day (TID) | SUBCUTANEOUS | Status: DC
  Administered 2016-12-18: 2 [IU] via SUBCUTANEOUS
  Filled 2016-12-18: qty 2

## 2016-12-18 MED ORDER — IPRATROPIUM-ALBUTEROL 0.5-2.5 (3) MG/3ML IN SOLN
3.0000 mL | RESPIRATORY_TRACT | Status: DC
  Administered 2016-12-18 – 2016-12-23 (×32): 3 mL via RESPIRATORY_TRACT
  Filled 2016-12-18 (×30): qty 3

## 2016-12-18 MED ORDER — ALBUTEROL SULFATE (2.5 MG/3ML) 0.083% IN NEBU
INHALATION_SOLUTION | RESPIRATORY_TRACT | Status: AC
  Administered 2016-12-18: 10 mg via RESPIRATORY_TRACT
  Filled 2016-12-18: qty 12

## 2016-12-18 MED ORDER — HEPARIN BOLUS VIA INFUSION
3250.0000 [IU] | Freq: Once | INTRAVENOUS | Status: AC
  Administered 2016-12-18: 3250 [IU] via INTRAVENOUS
  Filled 2016-12-18: qty 3250

## 2016-12-18 MED ORDER — GABAPENTIN 100 MG PO CAPS
200.0000 mg | ORAL_CAPSULE | Freq: Two times a day (BID) | ORAL | Status: DC
  Administered 2016-12-18 – 2016-12-23 (×11): 200 mg via ORAL
  Filled 2016-12-18 (×11): qty 2

## 2016-12-18 MED ORDER — CEFTRIAXONE SODIUM-DEXTROSE 1-3.74 GM-% IV SOLR
1.0000 g | Freq: Once | INTRAVENOUS | Status: DC
  Administered 2016-12-18: 1 g via INTRAVENOUS

## 2016-12-18 MED ORDER — METHYLPREDNISOLONE SODIUM SUCC 40 MG IJ SOLR
40.0000 mg | Freq: Two times a day (BID) | INTRAMUSCULAR | Status: DC
  Administered 2016-12-18 – 2016-12-23 (×11): 40 mg via INTRAVENOUS
  Filled 2016-12-18 (×11): qty 1

## 2016-12-18 MED ORDER — FUROSEMIDE 10 MG/ML IJ SOLN
40.0000 mg | Freq: Once | INTRAMUSCULAR | Status: AC
  Administered 2016-12-18: 40 mg via INTRAVENOUS
  Filled 2016-12-18: qty 4

## 2016-12-18 MED ORDER — INSULIN ASPART 100 UNIT/ML ~~LOC~~ SOLN
0.0000 [IU] | SUBCUTANEOUS | Status: DC
  Administered 2016-12-18 (×2): 2 [IU] via SUBCUTANEOUS
  Administered 2016-12-18 – 2016-12-19 (×3): 1 [IU] via SUBCUTANEOUS
  Administered 2016-12-19 (×2): 2 [IU] via SUBCUTANEOUS
  Administered 2016-12-19 – 2016-12-20 (×2): 1 [IU] via SUBCUTANEOUS
  Administered 2016-12-20 (×2): 2 [IU] via SUBCUTANEOUS
  Administered 2016-12-20: 1 [IU] via SUBCUTANEOUS
  Administered 2016-12-20 – 2016-12-21 (×4): 2 [IU] via SUBCUTANEOUS
  Administered 2016-12-21: 3 [IU] via SUBCUTANEOUS
  Administered 2016-12-21 (×2): 2 [IU] via SUBCUTANEOUS
  Administered 2016-12-21: 1 [IU] via SUBCUTANEOUS
  Administered 2016-12-22 (×2): 2 [IU] via SUBCUTANEOUS
  Filled 2016-12-18: qty 1
  Filled 2016-12-18: qty 2
  Filled 2016-12-18 (×2): qty 1
  Filled 2016-12-18 (×6): qty 2
  Filled 2016-12-18: qty 3
  Filled 2016-12-18 (×2): qty 2
  Filled 2016-12-18: qty 1
  Filled 2016-12-18: qty 2
  Filled 2016-12-18: qty 1
  Filled 2016-12-18: qty 2
  Filled 2016-12-18: qty 1
  Filled 2016-12-18 (×3): qty 2
  Filled 2016-12-18: qty 1

## 2016-12-18 MED ORDER — ALBUTEROL SULFATE (2.5 MG/3ML) 0.083% IN NEBU
2.5000 mg | INHALATION_SOLUTION | Freq: Once | RESPIRATORY_TRACT | Status: AC
  Administered 2016-12-18: 10 mg via RESPIRATORY_TRACT

## 2016-12-18 MED ORDER — POLYETHYLENE GLYCOL 3350 17 G PO PACK
17.0000 g | PACK | Freq: Every day | ORAL | Status: DC | PRN
  Administered 2016-12-20: 17 g via ORAL
  Filled 2016-12-18: qty 1

## 2016-12-18 MED ORDER — HEPARIN (PORCINE) IN NACL 100-0.45 UNIT/ML-% IJ SOLN
1150.0000 [IU]/h | INTRAMUSCULAR | Status: DC
  Administered 2016-12-18 – 2016-12-20 (×3): 1000 [IU]/h via INTRAVENOUS
  Administered 2016-12-21: 1150 [IU]/h via INTRAVENOUS
  Filled 2016-12-18 (×4): qty 250

## 2016-12-18 MED ORDER — ACETAMINOPHEN 500 MG PO TABS
500.0000 mg | ORAL_TABLET | ORAL | Status: DC | PRN
  Administered 2016-12-23: 500 mg via ORAL
  Filled 2016-12-18: qty 1

## 2016-12-18 MED ORDER — HEPARIN SODIUM (PORCINE) 5000 UNIT/ML IJ SOLN: 5000.0000 [IU] | Freq: Three times a day (TID) | INTRAMUSCULAR | Status: DC

## 2016-12-18 MED ORDER — BUDESONIDE 0.5 MG/2ML IN SUSP
0.5000 mg | Freq: Two times a day (BID) | RESPIRATORY_TRACT | Status: DC
  Administered 2016-12-18 – 2016-12-23 (×11): 0.5 mg via RESPIRATORY_TRACT
  Filled 2016-12-18 (×12): qty 2

## 2016-12-18 MED ORDER — NITROGLYCERIN 0.4 MG SL SUBL
0.4000 mg | SUBLINGUAL_TABLET | SUBLINGUAL | Status: DC | PRN
  Administered 2016-12-20: 0.4 mg via SUBLINGUAL
  Filled 2016-12-18: qty 1

## 2016-12-18 MED ORDER — FUROSEMIDE 10 MG/ML IJ SOLN
20.0000 mg | Freq: Two times a day (BID) | INTRAMUSCULAR | Status: DC
  Administered 2016-12-18 – 2016-12-23 (×10): 20 mg via INTRAVENOUS
  Filled 2016-12-18 (×10): qty 2

## 2016-12-18 MED ORDER — GABAPENTIN 300 MG PO CAPS
300.0000 mg | ORAL_CAPSULE | Freq: Every day | ORAL | Status: DC
  Administered 2016-12-18 – 2016-12-22 (×5): 300 mg via ORAL
  Filled 2016-12-18 (×7): qty 1

## 2016-12-18 MED ORDER — ALBUTEROL SULFATE HFA 108 (90 BASE) MCG/ACT IN AERS: 2.0000 | INHALATION_SPRAY | RESPIRATORY_TRACT | Status: DC | PRN

## 2016-12-18 MED ORDER — INSULIN ASPART 100 UNIT/ML ~~LOC~~ SOLN
5.0000 [IU] | Freq: Once | SUBCUTANEOUS | Status: AC
  Administered 2016-12-18: 5 [IU] via INTRAVENOUS
  Filled 2016-12-18: qty 5

## 2016-12-18 NOTE — Progress Notes (Signed)
1800 more alert this pm. Concerned about voiding in urinal now and asking for urinal. Continues to have expiratory wheezes. Has remained on BiPAP all day.

## 2016-12-18 NOTE — Consult Note (Signed)
Reason for Consult: Respiratory failure congestive heart failure Referring Physician: Dr Sylvester Harder is an 83 y.o. male.  HPI: Patient 83 year old male presented with acute respiratory failure shortness of breath congestive heart failure. Patient states it started last night at the nursing facility the paramedics came after he got progressively worse and was brought in via EMS with CPAP. Patient had crackles bilaterally suggestive of heart failure was hypoxic was placed on oxygen. Patient's had worsening lower extremity edema denies any chest pain. Patient was placed on BiPAP (to have helped some of his symptoms now still on respiratory support  Past Medical History:  Diagnosis Date  . Anginal pain ([place])   . CAD (coronary artery disease)   . CKD (chronic kidney disease), stage III   . Diabetes mellitus without complication ([place])   . Dyspnea   . GERD (gastroesophageal reflux disease)   . History of gout   . History of MI (myocardial infarction)   . Hyperlipidemia   . Hypertension   . Myocardial infarction   . Peripheral vascular disease ([place])     History reviewed. No pertinent surgical history.  Family History  Problem Relation Age of Onset  . Hypertension Father   . Diabetes Neg Hx     Social History:  reports that he has never smoked. He has never used smokeless tobacco. He reports that he does not drink alcohol or use drugs.  Allergies:  Allergies  Allergen Reactions  . Fish Allergy Anaphylaxis  . Keflex [Cephalexin] Nausea And Vomiting    Daughter advises that his vomiting is extremely "voilent and severe".  . Morphine And Related Nausea And Vomiting  . Penicillins     Has patient had a PCN reaction causing immediate rash, facial/tongue/throat swelling,} If all of the above answers are "NO", then may proceed with Cephalosporin use. SOB or lightheadedness with hypotension: No Has patient had a PCN reaction causing severe rash involving mucus membranes or skin  necrosis: Unknown Has patient had a PCN reaction that required hospitalization Unknown Has patient had a PCN reaction occurring within the last 10 years: No     Medications: I have reviewed the patient's current medications.  Results for orders placed or performed during the hospital encounter of 12/18/16 (from the past 48 hour(s))  Comprehensive metabolic panel     Status: Abnormal   Collection Time: 12/18/16  5:48 AM  Result Value Ref Range   Sodium 134 (L) 135 - 145 mmol/L   Potassium 6.4 (HH) 3.5 - 5.1 mmol/L    Comment: CRITICAL RESULT CALLED TO, READ BACK BY AND VERIFIED WITH KENNEY PATEL @ 6144 ON 12/18/2016 BY CAF    Chloride 101 101 - 111 mmol/L   CO2 25 22 - 32 mmol/L   Glucose, Bld 178 (H) 65 - 99 mg/dL   BUN 36 (H) 6 - 20 mg/dL   Creatinine, Ser 2.08 (H) 0.61 - 1.24 mg/dL   Calcium 8.3 (L) 8.9 - 10.3 mg/dL   Total Protein 7.1 6.5 - 8.1 g/dL   Albumin 3.2 (L) 3.5 - 5.0 g/dL   AST 26 15 - 41 U/L   ALT 15 (L) 17 - 63 U/L   Alkaline Phosphatase 67 38 - 126 U/L   Total Bilirubin 0.4 0.3 - 1.2 mg/dL   GFR calc non Af Amer 25 (L) >60 mL/min   GFR calc Af Amer 30 (L) >60 mL/min    Comment: (NOTE) The eGFR has been calculated using the CKD EPI equation. This  calculation has not been validated in all clinical situations. eGFR's persistently <60 mL/min signify possible Chronic Kidney Disease.    Anion gap 8 5 - 15  Brain natriuretic peptide     Status: Abnormal   Collection Time: 12/18/16  5:48 AM  Result Value Ref Range   B Natriuretic Peptide 522.0 (H) 0.0 - 100.0 pg/mL  Troponin I     Status: Abnormal   Collection Time: 12/18/16  5:48 AM  Result Value Ref Range   Troponin I 0.19 (HH) <0.03 ng/mL    Comment: CRITICAL RESULT CALLED TO, READ BACK BY AND VERIFIED WITH KENNEY PATEL @ [phone] ON 12/18/2016 BY CAF   Lactic acid, plasma     Status: None   Collection Time: 12/18/16  5:48 AM  Result Value Ref Range   Lactic Acid, Venous 1.9 0.5 - 1.9 mmol/L  CBC with  Differential     Status: Abnormal   Collection Time: 12/18/16  5:48 AM  Result Value Ref Range   WBC 6.8 3.8 - 10.6 K/uL   RBC 3.40 (L) 4.40 - 5.90 MIL/uL   Hemoglobin 8.4 (L) 13.0 - 18.0 g/dL   HCT 27.0 (L) 40.0 - 52.0 %   MCV 79.3 (L) 80.0 - 100.0 fL   MCH 24.6 (L) 26.0 - 34.0 pg   MCHC 31.1 (L) 32.0 - 36.0 g/dL   RDW 17.0 (H) 11.5 - 14.5 %   Platelets 235 150 - 440 K/uL   Neutrophils Relative % 86 %   Neutro Abs 5.9 1.4 - 6.5 K/uL   Lymphocytes Relative 7 %   Lymphs Abs 0.5 (L) 1.0 - 3.6 K/uL   Monocytes Relative 7 %   Monocytes Absolute 0.5 0.2 - 1.0 K/uL   Eosinophils Relative 0 %   Eosinophils Absolute 0.0 0 - 0.7 K/uL   Basophils Relative 0 %   Basophils Absolute 0.0 0 - 0.1 K/uL  Blood gas, venous     Status: Abnormal   Collection Time: 12/18/16  6:12 AM  Result Value Ref Range   pH, Ven 7.32 7.250 - 7.430   pCO2, Ven 48 44.0 - 60.0 mmHg   pO2, Ven 46.0 (H) 32.0 - 45.0 mmHg   Bicarbonate 24.7 20.0 - 28.0 mmol/L   Acid-base deficit 1.5 0.0 - 2.0 mmol/L   O2 Saturation 77.6 %   Patient temperature 37.0    Collection site RIGHT ANTECUBITAL    Sample type VENOUS   Troponin I     Status: Abnormal   Collection Time: 12/18/16  7:36 AM  Result Value Ref Range   Troponin I 0.39 (HH) <0.03 ng/mL    Comment: CRITICAL VALUE NOTED. VALUE IS CONSISTENT WITH PREVIOUSLY REPORTED/CALLED VALUE. SGD  Urinalysis, Routine w reflex microscopic     Status: Abnormal   Collection Time: 12/18/16  8:05 AM  Result Value Ref Range   Color, Urine YELLOW (A) YELLOW   APPearance CLOUDY (A) CLEAR   Specific Gravity, Urine 1.011 1.005 - 1.030   pH 5.0 5.0 - 8.0   Glucose, UA NEGATIVE NEGATIVE mg/dL   Hgb urine dipstick SMALL (A) NEGATIVE   Bilirubin Urine NEGATIVE NEGATIVE   Ketones, ur NEGATIVE NEGATIVE mg/dL   Protein, ur NEGATIVE NEGATIVE mg/dL   Nitrite NEGATIVE NEGATIVE   Leukocytes, UA LARGE (A) NEGATIVE   RBC / HPF 6-30 0 - 5 RBC/hpf   WBC, UA TOO NUMEROUS TO COUNT 0 - 5 WBC/hpf    Bacteria, UA MANY (A) NONE SEEN   Squamous Epithelial /  LPF NONE SEEN NONE SEEN   Mucous PRESENT    Hyaline Casts, UA PRESENT   Glucose, capillary     Status: Abnormal   Collection Time: 12/18/16  8:16 AM  Result Value Ref Range   Glucose-Capillary 158 (H) 65 - 99 mg/dL  Lactic acid, plasma     Status: Abnormal   Collection Time: 12/18/16 10:24 AM  Result Value Ref Range   Lactic Acid, Venous 5.1 (HH) 0.5 - 1.9 mmol/L    Comment: CRITICAL RESULT CALLED TO, READ BACK BY AND VERIFIED WITH MYRA FLOWERS 12/18/16 1124 SGD   Troponin I     Status: Abnormal   Collection Time: 12/18/16 10:24 AM  Result Value Ref Range   Troponin I 0.87 (HH) <0.03 ng/mL    Comment: CRITICAL VALUE NOTED. VALUE IS CONSISTENT WITH PREVIOUSLY REPORTED/CALLED VALUE. SGD  Procalcitonin - Baseline     Status: None   Collection Time: 12/18/16 10:24 AM  Result Value Ref Range   Procalcitonin 0.10 ng/mL    Comment:        Interpretation: PCT (Procalcitonin) <= 0.5 ng/mL: Systemic infection (sepsis) is not likely. Local bacterial infection is possible. (NOTE)         ICU PCT Algorithm               Non ICU PCT Algorithm    ----------------------------     ------------------------------         PCT < 0.25 ng/mL                 PCT < 0.1 ng/mL     Stopping of antibiotics            Stopping of antibiotics       strongly encouraged.               strongly encouraged.    ----------------------------     ------------------------------       PCT level decrease by               PCT < 0.25 ng/mL       >= 80% from peak PCT       OR PCT 0.25 - 0.5 ng/mL          Stopping of antibiotics                                             encouraged.     Stopping of antibiotics           encouraged.    ----------------------------     ------------------------------       PCT level decrease by              PCT >= 0.25 ng/mL       < 80% from peak PCT        AND PCT >= 0.5 ng/mL            Continuin g antibiotics                                               encouraged.       Continuing antibiotics            encouraged.    ----------------------------     ------------------------------  PCT level increase compared          PCT > 0.5 ng/mL         with peak PCT AND          PCT >= 0.5 ng/mL             Escalation of antibiotics                                          strongly encouraged.      Escalation of antibiotics        strongly encouraged.   CBC     Status: Abnormal   Collection Time: 12/18/16 10:24 AM  Result Value Ref Range   WBC 5.8 3.8 - 10.6 K/uL   RBC 3.48 (L) 4.40 - 5.90 MIL/uL   Hemoglobin 8.8 (L) 13.0 - 18.0 g/dL   HCT 28.1 (L) 40.0 - 52.0 %   MCV 80.6 80.0 - 100.0 fL   MCH 25.1 (L) 26.0 - 34.0 pg   MCHC 31.2 (L) 32.0 - 36.0 g/dL   RDW 16.6 (H) 11.5 - 14.5 %   Platelets 227 150 - 440 K/uL  Creatinine, serum     Status: Abnormal   Collection Time: 12/18/16 10:24 AM  Result Value Ref Range   Creatinine, Ser 2.12 (H) 0.61 - 1.24 mg/dL   GFR calc non Af Amer 25 (L) >60 mL/min   GFR calc Af Amer 29 (L) >60 mL/min    Comment: (NOTE) The eGFR has been calculated using the CKD EPI equation. This calculation has not been validated in all clinical situations. eGFR's persistently <60 mL/min signify possible Chronic Kidney Disease.   MRSA PCR Screening     Status: None   Collection Time: 12/18/16 10:29 AM  Result Value Ref Range   MRSA by PCR NEGATIVE NEGATIVE    Comment:        The GeneXpert MRSA Assay (FDA approved for NASAL specimens only), is one component of a comprehensive MRSA colonization surveillance program. It is not intended to diagnose MRSA infection nor to guide or monitor treatment for MRSA infections.   Glucose, capillary     Status: Abnormal   Collection Time: 12/18/16 10:30 AM  Result Value Ref Range   Glucose-Capillary 158 (H) 65 - 99 mg/dL  Potassium     Status: None   Collection Time: 12/18/16 12:59 PM  Result Value Ref Range   Potassium 4.9 3.5 - 5.1 mmol/L   Glucose, capillary     Status: Abnormal   Collection Time: 12/18/16  1:10 PM  Result Value Ref Range   Glucose-Capillary 149 (H) 65 - 99 mg/dL   Comment 1 Notify RN   Troponin I     Status: Abnormal   Collection Time: 12/18/16  4:08 PM  Result Value Ref Range   Troponin I 1.16 (HH) <0.03 ng/mL    Comment: CRITICAL VALUE NOTED. VALUE IS CONSISTENT WITH PREVIOUSLY REPORTED/CALLED VALUE/ MLK    Dg Chest Portable 1 View  Result Date: 12/18/2016 CLINICAL DATA:  Acute onset of shortness of breath. Initial encounter. EXAM: PORTABLE CHEST 1 VIEW COMPARISON:  Chest radiograph performed 09/02/2016 FINDINGS: The lungs are well-aerated. Vascular congestion is noted. Increased interstitial markings raise concern for pulmonary edema. There is no evidence of pleural effusion or pneumothorax. The cardiomediastinal silhouette is mildly enlarged. No acute osseous abnormalities are seen. IMPRESSION: Vascular congestion and mild  cardiomegaly. Increased interstitial markings raise concern for pulmonary edema. Electronically Signed   By: Garald Balding M.D.   On: 12/18/2016 06:05    Review of Systems  Unable to perform ROS: Acuity of condition   Blood pressure 113/63, pulse 90, temperature 98.8 F (37.1 C), temperature source Axillary, resp. rate 18, height 5' 6"  (1.676 m), weight 94.3 kg (207 lb 14.3 oz), SpO2 94 %. Physical Exam  Nursing note and vitals reviewed. Constitutional: He is oriented to person, place, and time. He appears well-developed and well-nourished.  HENT:  Head: Normocephalic and atraumatic.  Eyes: Conjunctivae and EOM are normal. Pupils are equal, round, and reactive to light.  Neck: Normal range of motion. Neck supple.  Cardiovascular: Normal rate and regular rhythm.   Murmur heard. Respiratory: He is in respiratory distress.  GI: Soft. Bowel sounds are normal.  Musculoskeletal: Normal range of motion.  Neurological: He is alert and oriented to person, place, and time. He has  normal reflexes.  Skin: Skin is warm.  Psychiatric: He has a normal mood and affect.    Assessment/Plan: Respiratory failure Congestive heart failure Pulmonary edema Hypoxemia Hypokalemia Elevated troponin [place]and ischemia Acute on chronic renal insufficiency Obesity . Plan Continue ICU care Agree with BiPAP therapy for history support Continue supplemental oxygen Agree with inhalers Continue diuretic therapy for failure Continue insulin therapy for diabetes Weight loss exercise portion control Continue conservative cardiac care except for heart failure  Dwayne D Callwood 12/18/2016, 5:08 PM

## 2016-12-18 NOTE — ED Notes (Signed)
Attempted to give report to Myra in CCU. States she will call me back.

## 2016-12-18 NOTE — Consult Note (Signed)
Name: Timothy Hays MRN: [id number] DOB: [birth year]    ADMISSION DATE:  12/18/2016 CONSULTATION DATE:  12/18/2016  REFERRING MD :  Dr. Madelon Lips  CHIEF COMPLAINT:  Respiratory Distress   BRIEF PATIENT DESCRIPTION:  83 yo male with acute hypoxic respiratory failure secondary to pulmonary edema requiring continuous Bipap.  SIGNIFICANT EVENTS  04/12-Pt admitted to [facility] and PCCM consulted for management of acute hypoxic respiratory failure   STUDIES:  None  HISTORY OF PRESENT ILLNESS:   This is a 83 yo male with a PMH of Hyperlipidemia, MI, Gout, GERD, Diabetes Mellitus, CKD-stage III, and CAD. He presented to [facility] ER 04/12 from a nursing facility with acute onset of severe shortness of breath onset 04/11 at 11 pm.  EMS was notified by the facility and upon arrival the pt was 80% on RA, therefore he was placed on a nonrebreather mask initially then transitioned to CPAP.  Upon arrival to the ER it was noted he had bilateral crackles upon auscultation with increased work of breathing and retraction.  Per ER notes the nursing facility reported he has had worsening bilateral lower extremity edema over the past few days.  He does not have a hx of CHF or COPD.  Due to worsening shortness of breath he was placed on continuous Bipap.  PCCM consulted 04/12 for management of acute hypoxic respiratory failure secondary to pulmonary edema.   PAST MEDICAL HISTORY :   has a past medical history of CAD (coronary artery disease); CKD (chronic kidney disease), stage III; Diabetes mellitus without complication (HCC); GERD (gastroesophageal reflux disease); History of gout; History of MI (myocardial infarction); and Hyperlipidemia.  has no past surgical history on file. Prior to Admission medications   Medication Sig Start Date End Date Taking? Authorizing Provider  acetaminophen (TYLENOL) 500 MG tablet Take 500 mg by mouth every 4 (four) hours as needed for mild pain.    Yes Historical Provider, MD    albuterol (PROVENTIL HFA;VENTOLIN HFA) 108 (90 Base) MCG/ACT inhaler Inhale 2 puffs into the lungs every 4 (four) hours as needed for wheezing or shortness of breath.   Yes Historical Provider, MD  azithromycin (ZITHROMAX) 250 MG tablet Take 250-500 mg by mouth daily. Take  on day one, then  for 4 days. 12/17/16 12/21/16 Yes Historical Provider, MD  gabapentin (NEURONTIN) 100 MG capsule Take 200-300 mg by mouth 3 (three) times daily. Take  at 0900 and 1200. Take  at bedtime.   Yes Historical Provider, MD  guaiFENesin-dextromethorphan (ROBITUSSIN DM) 100-10 MG/5ML syrup Take 10 mLs by mouth every 4 (four) hours as needed for cough.   Yes Historical Provider, MD  Infant Care Products (DERMACLOUD) CREA Apply 1 application topically 2 (two) times daily.   Yes Historical Provider, MD  latanoprost (XALATAN) 0.005 % ophthalmic solution Place 1 drop into the right eye at bedtime.   Yes Historical Provider, MD  metFORMIN (GLUCOPHAGE) 500 MG tablet Take 500 mg by mouth 2 (two) times daily.  06/10/16  Yes Historical Provider, MD  nitroGLYCERIN (NITROSTAT) 0.4 MG SL tablet Place 0.4 mg under the tongue every 5 (five) minutes as needed for chest pain.   Yes Historical Provider, MD  pantoprazole (PROTONIX) 40 MG tablet Take 40 mg by mouth daily.  06/10/16  Yes Historical Provider, MD  polyethylene glycol (MIRALAX / GLYCOLAX) packet Take 17 g by mouth daily as needed for mild constipation.   Yes Historical Provider, MD  Spacer/Aero Chamber Mouthpiece MISC 1 each by Does not apply route  every 4 (four) hours as needed (with albuterol).   Yes Historical Provider, MD  sulfamethoxazole-trimethoprim (BACTRIM DS,SEPTRA DS) 800-160 MG tablet Take 1 tablet by mouth 2 (two) times daily. 12/11/16 12/20/16 Yes Historical Provider, MD   Allergies  Allergen Reactions  . Fish Allergy Anaphylaxis  . Keflex [Cephalexin] Nausea And Vomiting    Daughter advises that his vomiting is extremely "voilent and severe".  .  Morphine And Related Nausea And Vomiting  . Penicillins     Has patient had a PCN reaction causing immediate rash, facial/tongue/throat swelling,} If all of the above answers are "NO", then may proceed with Cephalosporin use. SOB or lightheadedness with hypotension: No Has patient had a PCN reaction causing severe rash involving mucus membranes or skin necrosis: Unknown Has patient had a PCN reaction that required hospitalization Unknown Has patient had a PCN reaction occurring within the last 10 years: No     FAMILY HISTORY:  family history includes Hypertension in his father. SOCIAL HISTORY:  reports that he has never smoked. He has never used smokeless tobacco. He reports that he does not drink alcohol.  REVIEW OF SYSTEMS: Positives in BOLD Constitutional: Negative for fever, chills, weight loss, malaise/fatigue and diaphoresis.  HENT: Negative for hearing loss, ear pain, nosebleeds, congestion, sore throat, neck pain, tinnitus and ear discharge.   Eyes: Negative for blurred vision, double vision, photophobia, pain, discharge and redness.  Respiratory: cough, hemoptysis, sputum production, shortness of breath, wheezing and stridor.   Cardiovascular: chest pain, palpitations, orthopnea, claudication, leg swelling and PND.  Gastrointestinal: Negative for heartburn, nausea, vomiting, abdominal pain, diarrhea, constipation, blood in stool and melena.  Genitourinary: Negative for dysuria, urgency, frequency, hematuria and flank pain.  Musculoskeletal: Negative for myalgias, back pain, joint pain and falls.  Skin: Negative for itching and rash.  Neurological: Negative for dizziness, tingling, tremors, sensory change, speech change, focal weakness, seizures, loss of consciousness, weakness and headaches.  Endo/Heme/Allergies: Negative for environmental allergies and polydipsia. Does not bruise/bleed easily.  SUBJECTIVE:  No complaints at this time.   VITAL SIGNS: Temp:  [98.7 F (37.1  C)] 98.7 F (37.1 C) (04/12 0554) Pulse Rate:  [86-100] 98 (04/12 0815) Resp:  [19-24] 24 (04/12 0815) BP: (102-136)/(67-95) 112/95 (04/12 0815) SpO2:  [94 %-100 %] 97 % (04/12 0815) Weight:  [104 kg (229 lb 4.8 oz)] 104 kg (229 lb 4.8 oz) (04/12 0555)  PHYSICAL EXAMINATION: General: acutely ill Caucasian male  Neuro: alert and oriented, HOH, follows commands  HEENT: supple, mild JVD Cardiovascular: sinus tach with PVC's, s1s2, no M/R/G Lungs: inspiratory and expiratory wheezes throughout, crackles bilateral bases, mildly tachypneic Abdomen: +BS x4, soft, non tender, non distended Musculoskeletal: 3+ bilateral lower extremity pitting edema  Skin: intact no rashes or lesions   Recent Labs Lab 12/18/16 0548  NA 134*  K 6.4*  CL 101  CO2 25  BUN 36*  CREATININE 2.08*  GLUCOSE 178*    Recent Labs Lab 12/18/16 0548  HGB 8.4*  HCT 27.0*  WBC 6.8  PLT 235   Dg Chest Portable 1 View  Result Date: 12/18/2016 CLINICAL DATA:  Acute onset of shortness of breath. Initial encounter. EXAM: PORTABLE CHEST 1 VIEW COMPARISON:  Chest radiograph performed 09/02/2016 FINDINGS: The lungs are well-aerated. Vascular congestion is noted. Increased interstitial markings raise concern for pulmonary edema. There is no evidence of pleural effusion or pneumothorax. The cardiomediastinal silhouette is mildly enlarged. No acute osseous abnormalities are seen. IMPRESSION: Vascular congestion and mild cardiomegaly. Increased interstitial markings raise  concern for pulmonary edema. Electronically Signed   By: Roanna Raider M.D.   On: 12/18/2016 06:05    ASSESSMENT / PLAN: Acute hypoxic respiratory failure secondary to pulmonary edema Urinary Tract Infection Acute on chronic renal failure Hyperkalemia Elevated troponin's secondary to demand ischemia vs. NSTEMI Anemia without acute blood loss Diabetes Mellitus  Hx: CKD-stage III P: Continuous Bipap for now wean as tolerated will transition to  nasal canula once respiratory status improves Maintain O2 sats >92% Lasix 20 mg q12hrs Echo pending Trend troponin's Cardiology consulted appreciate input  Repeat CXR in am  IV solumedrol  Continue scheduled bronchodilator therapy  Prn ABG's Start Rocephin Follow urine culture Trend PCT's and lactic acid Trend WBC's and monitor fever curve Will repeat K+ today 1200 ml fluid restriction  Daily weights  Trend CBC's  Subq heparin for VTE prophylaxis  Monitor for s/sx of bleeding   Update: Spoke with pts daughter regarding plan of care and all questions answered.  She stated the pt is a DO NOT RESUSCITATE she also stated if dialysis were needed the pt has made it clear he DOES NOT want any form of dialysis.  Sonda Rumble, AGNP  Pulmonary/Critical Care Pager [phone] (please enter 7 digits) PCCM Consult Pager [phone] (please enter 7 digits)

## 2016-12-18 NOTE — ED Triage Notes (Signed)
Pt presents to ED 26 via EMS from [facility]; per EMS, pt has been respiratory distress since 2300 12/17/16; pt was found with O2 sats at 80% on room air, which improved to 99% when pt put on D-PAP; pt's VS were 140/76, HR 97, pt was given one breathing treatment on the truck; pt has no cough; at this time pt is alert; unable to obtain any further information due to breathing mask on the pt's face

## 2016-12-18 NOTE — Progress Notes (Signed)
Placed patient back on Bipap per Dr Belia Heman request. Increased Ipap to 16, Annabelle Harman NP aware.

## 2016-12-18 NOTE — Progress Notes (Signed)
Pharmacy Antibiotic Note  Timothy Hays is a 83 y.o. male admitted on 12/18/2016 with cellulitis and UTI.  Pharmacy has been consulted for ceftriaxone dosing.  Plan: Ceftriaxone 1g IV Q24hr. Patient with allergy to cephalexin (nausea and vomiting); per CCM team will continue to monitor for intolerance.   Height:  (167.6 cm) Weight: 207 lb 14.3 oz (94.3 kg) IBW/kg (Calculated) : 63.8  Temp (24hrs), Avg:98.7 F (37.1 C), Min:98.6 F (37 C), Max:98.7 F (37.1 C)   Recent Labs Lab 12/18/16 0548 12/18/16 1024  WBC 6.8 5.8  CREATININE 2.08* 2.12*  LATICACIDVEN 1.9 5.1*    Estimated Creatinine Clearance: 22.4 mL/min (A) (by C-G formula based on SCr of 2.12 mg/dL (H)).    Allergies  Allergen Reactions  . Fish Allergy Anaphylaxis  . Keflex [Cephalexin] Nausea And Vomiting    Daughter advises that his vomiting is extremely "voilent and severe".  . Morphine And Related Nausea And Vomiting  . Penicillins     Has patient had a PCN reaction causing immediate rash, facial/tongue/throat swelling,} If all of the above answers are "NO", then may proceed with Cephalosporin use. SOB or lightheadedness with hypotension: No Has patient had a PCN reaction causing severe rash involving mucus membranes or skin necrosis: Unknown Has patient had a PCN reaction that required hospitalization Unknown Has patient had a PCN reaction occurring within the last 10 years: No     Antimicrobials this admission: Ceftriaxone 4/12 >>   Dose adjustments this admission: N/A  Microbiology results: 4/12 UCx: Sent  4/12 MRSA PCR: negative   Pharmacy will continue to monitor and adjust per consult.    Elisea Khader L 12/18/2016 1:56 PM

## 2016-12-18 NOTE — Progress Notes (Signed)
Removed from bipap,placed on 2lnc ,tolerating well at present

## 2016-12-18 NOTE — ED Notes (Signed)
Informed Emilie, ED Secretary to page intensivist in regards to patients room assignment. Awaiting response.

## 2016-12-18 NOTE — ED Provider Notes (Signed)
[facility] Emergency Department Provider Note  ____________________________________________   First MD Initiated Contact with Patient 12/18/16 [phone]     (approximate)  I have reviewed the triage vital signs and the nursing notes.   HISTORY  Chief Complaint Respiratory Distress  Level 5 caveat:  history/ROS limited by acute/critical illness  HPI Timothy Hays is a 83 y.o. male who presents by EMS for evaluation of acute onset severe shortness of breath that reportedly started at 11 PM last night.  His nursing facility told the paramedics that he was short of breath since 11 PM that he got steadily worse.  When EMS arrived he was at 80% on room air and they started him on first a nonrebreather and then on CPAP.  He has crackles bilaterally in his lungs and he had increased work of breathing and retractions although that has improved since being on CPAP.  His facility staff reports that he has had worsening bilateral lower extremity swelling over the last few days.  He has no reported history of either CHF or COPD.  His symptoms are severe and they are not getting better on CPAP.  He is alert and oriented at this time and his respiratory distress is currently mild to moderate.  Through his CPAP he is only able to provide a minimal medical history, but he denies chest pain and abdominal pain.He states that he feels much better after starting on the CPAP.     Past Medical History:  Diagnosis Date  . CAD (coronary artery disease)   . CKD (chronic kidney disease), stage III   . Diabetes mellitus without complication (HCC)   . GERD (gastroesophageal reflux disease)   . History of gout   . History of MI (myocardial infarction)   . Hyperlipidemia     Patient Active Problem List   Diagnosis Date Noted  . Sepsis (HCC) 09/02/2016    History reviewed. No pertinent surgical history.  Prior to Admission medications   Medication Sig Start Date End Date Taking?  Authorizing Provider  acetaminophen (TYLENOL) 500 MG tablet Take 500 mg by mouth every 4 (four) hours as needed for mild pain.    Yes Historical Provider, MD  albuterol (PROVENTIL HFA;VENTOLIN HFA) 108 (90 Base) MCG/ACT inhaler Inhale 2 puffs into the lungs every 4 (four) hours as needed for wheezing or shortness of breath.   Yes Historical Provider, MD  azithromycin (ZITHROMAX) 250 MG tablet Take 250-500 mg by mouth daily. Take  on day one, then  for 4 days. 12/17/16 12/21/16 Yes Historical Provider, MD  gabapentin (NEURONTIN) 100 MG capsule Take 200-300 mg by mouth 3 (three) times daily. Take  at 0900 and 1200. Take  at bedtime.   Yes Historical Provider, MD  guaiFENesin-dextromethorphan (ROBITUSSIN DM) 100-10 MG/5ML syrup Take 10 mLs by mouth every 4 (four) hours as needed for cough.   Yes Historical Provider, MD  Infant Care Products (DERMACLOUD) CREA Apply 1 application topically 2 (two) times daily.   Yes Historical Provider, MD  latanoprost (XALATAN) 0.005 % ophthalmic solution Place 1 drop into the right eye at bedtime.   Yes Historical Provider, MD  metFORMIN (GLUCOPHAGE) 500 MG tablet Take 500 mg by mouth 2 (two) times daily.  06/10/16  Yes Historical Provider, MD  nitroGLYCERIN (NITROSTAT) 0.4 MG SL tablet Place 0.4 mg under the tongue every 5 (five) minutes as needed for chest pain.   Yes Historical Provider, MD  pantoprazole (PROTONIX) 40 MG tablet Take 40 mg by  mouth daily.  06/10/16  Yes Historical Provider, MD  polyethylene glycol (MIRALAX / GLYCOLAX) packet Take 17 g by mouth daily as needed for mild constipation.   Yes Historical Provider, MD  Spacer/Aero Chamber Mouthpiece MISC 1 each by Does not apply route every 4 (four) hours as needed (with albuterol).   Yes Historical Provider, MD  sulfamethoxazole-trimethoprim (BACTRIM DS,SEPTRA DS) 800-160 MG tablet Take 1 tablet by mouth 2 (two) times daily. 12/11/16 12/20/16 Yes Historical Provider, MD    Allergies Fish  allergy; Keflex [cephalexin]; Morphine and related; and Penicillins  Family History  Problem Relation Age of Onset  . Diabetes Neg Hx     Social History Social History  Substance Use Topics  . Smoking status: Never Smoker  . Smokeless tobacco: Never Used  . Alcohol use No    Review of Systems  Level 5 caveat:  history/ROS limited by acute/critical illness ____________________________________________   PHYSICAL EXAM:  VITAL SIGNS: ED Triage Vitals  Enc Vitals Group     BP 12/18/16 0554 136/80     Pulse Rate 12/18/16 0554 94     Resp 12/18/16 0554 (!) 22     Temp 12/18/16 0554 98.7 F (37.1 C)     Temp Source 12/18/16 0554 Axillary     SpO2 12/18/16 0548 99 %     Weight 12/18/16 0555 229 lb 4.8 oz (104 kg)     Height 12/18/16 0555 6' (1.829 m)     Head Circumference --      Peak Flow --      Pain Score --      Pain Loc --      Pain Edu? --      Excl. in GC? --     Constitutional: Alert and oriented. Moderate respiratory distress on CPAP Eyes: Conjunctivae are normal. PERRL. EOMI. Head: Atraumatic. Nose: No congestion/rhinnorhea. Mouth/Throat: Mucous membranes are moist. Neck: No stridor.  No meningeal signs.   Cardiovascular: Normal rate, regular rhythm. Good peripheral circulation. Grossly normal heart sounds. Respiratory: Increased respiratory effort.  Mild costal retractions.  Crackles throughout bilateral lungs Gastrointestinal: Soft and nontender. No distention.  Musculoskeletal: 1+ pitting edema throughout bilateral lower extremities. No gross deformities of extremities. Neurologic:  Normal speech and language. No gross focal neurologic deficits are appreciated.  Skin:  Skin is warm, dry and intact. No rash noted.   ____________________________________________   LABS (all labs ordered are listed, but only abnormal results are displayed)  Labs Reviewed  COMPREHENSIVE METABOLIC PANEL - Abnormal; Notable for the following:       Result Value   Sodium  134 (*)    Potassium 6.4 (*)    Glucose, Bld 178 (*)    BUN 36 (*)    Creatinine, Ser 2.08 (*)    Calcium 8.3 (*)    Albumin 3.2 (*)    ALT 15 (*)    GFR calc non Af Amer 25 (*)    GFR calc Af Amer 30 (*)    All other components within normal limits  BRAIN NATRIURETIC PEPTIDE - Abnormal; Notable for the following:    B Natriuretic Peptide 522.0 (*)    All other components within normal limits  TROPONIN I - Abnormal; Notable for the following:    Troponin I 0.19 (*)    All other components within normal limits  CBC WITH DIFFERENTIAL/PLATELET - Abnormal; Notable for the following:    RBC 3.40 (*)    Hemoglobin 8.4 (*)    HCT 27.0 (*)  MCV 79.3 (*)    MCH 24.6 (*)    MCHC 31.1 (*)    RDW 17.0 (*)    Lymphs Abs 0.5 (*)    All other components within normal limits  BLOOD GAS, VENOUS - Abnormal; Notable for the following:    pO2, Ven 46.0 (*)    All other components within normal limits  LACTIC ACID, PLASMA  LACTIC ACID, PLASMA  URINALYSIS, ROUTINE W REFLEX MICROSCOPIC   ____________________________________________  EKG  ED ECG REPORT I, Lachrisha Ziebarth, the attending physician, personally viewed and interpreted this ECG.  Date: 12/18/2016 EKG Time: 5:49 AM Rate: 96 Rhythm: normal sinus rhythm  QRS Axis: normal Intervals: normal ST/T Wave abnormalities: Mild ST segment depression in leads V4, V5, and V6 with no reciprocal changes.  No ST segment elevation elsewhere. Conduction Disturbances: none Narrative Interpretation: Suspect acute ischemia and most notable in the lateral leads but does not meet criteria for STEMI.  Overall morphology is very similar to last EKG on record from several months ago.  ____________________________________________  RADIOLOGY   Dg Chest Portable 1 View  Result Date: 12/18/2016 CLINICAL DATA:  Acute onset of shortness of breath. Initial encounter. EXAM: PORTABLE CHEST 1 VIEW COMPARISON:  Chest radiograph performed 09/02/2016 FINDINGS:  The lungs are well-aerated. Vascular congestion is noted. Increased interstitial markings raise concern for pulmonary edema. There is no evidence of pleural effusion or pneumothorax. The cardiomediastinal silhouette is mildly enlarged. No acute osseous abnormalities are seen. IMPRESSION: Vascular congestion and mild cardiomegaly. Increased interstitial markings raise concern for pulmonary edema. Electronically Signed   By: Roanna Raider M.D.   On: 12/18/2016 06:05    ____________________________________________   PROCEDURES  Critical Care performed: Yes, see critical care procedure note(s)   Procedure(s) performed:   .Critical Care Performed by: Loleta Rose Authorized by: Loleta Rose   Critical care provider statement:    Critical care time (minutes):  30   Critical care time was exclusive of:  Separately billable procedures and treating other patients   Critical care was necessary to treat or prevent imminent or life-threatening deterioration of the following conditions:  Respiratory failure and renal failure   Critical care was time spent personally by me on the following activities:  Development of treatment plan with patient or surrogate, discussions with consultants, evaluation of patient's response to treatment, examination of patient, obtaining history from patient or surrogate, ordering and performing treatments and interventions, ordering and review of laboratory studies, ordering and review of radiographic studies, pulse oximetry, re-evaluation of patient's condition and review of old charts      ____________________________________________   INITIAL IMPRESSION / ASSESSMENT AND PLAN / ED COURSE  Pertinent labs & imaging results that were available during my care of the patient were reviewed by me and considered in my medical decision making (see chart for details).  The patient is still in significant distress upon arrival so we started him immediately on BiPAP.  I  suspect he is having acute CHF although ACS and an infectious process or not yet ruled out.  I will evaluate broadly and reassess.   Clinical Course as of Dec 18 701  Thu Dec 18, 2016  4098 Pulmonary edema on chest x-ray with no sign of pneumonia.  I will give Lasix 40 mg IV.  Still awaiting the S2 of his lab work but his PCO2 on the VBG is normal and there is no indication of a COPD exacerbation.  Still doing better on BiPAP.  [CF]  [phone]  Anemia seems relatively stable from prior.  [CF]  0702 Spoke with hospitalist who will admit.  Patient resting comfortably and stable at this time.  [CF]    Clinical Course User Index [CF] Loleta Rose, MD    ____________________________________________  FINAL CLINICAL IMPRESSION(S) / ED DIAGNOSES  Final diagnoses:  Acute respiratory failure with hypoxemia (HCC)  Hyperkalemia  Elevated troponin I level  Demand ischemia (HCC)  Acute pulmonary edema (HCC)  Acute renal failure superimposed on stage 3 chronic kidney disease, unspecified acute renal failure type (HCC)     MEDICATIONS GIVEN DURING THIS VISIT:  Medications  furosemide (LASIX) injection 40 mg (40 mg Intravenous Given 12/18/16 0644)  albuterol (PROVENTIL) (2.5 MG/3ML) 0.083% nebulizer solution 10 mg (10 mg Nebulization Given 12/18/16 0644)  insulin aspart (novoLOG) injection 5 Units (5 Units Intravenous Given 12/18/16 0656)  dextrose 50 % solution 50 mL (50 mLs Intravenous Given 12/18/16 0655)     NEW OUTPATIENT MEDICATIONS STARTED DURING THIS VISIT:  New Prescriptions   No medications on file    Modified Medications   No medications on file    Discontinued Medications   LEVOFLOXACIN (LEVAQUIN) 500 MG TABLET    Take 1 tablet (500 mg total) by mouth daily.     Note:  This document was prepared using Dragon voice recognition software and may include unintentional dictation errors.    Loleta Rose, MD 12/18/16 [phone]

## 2016-12-18 NOTE — ED Notes (Signed)
Spoke with Dr. Belia Heman in regards to patient placement. Per Dr. Belia Heman, hospitalist will reevaluate patient and change floor assignment. Patient and family updated on plan of care.

## 2016-12-18 NOTE — Progress Notes (Signed)
*  PRELIMINARY RESULTS* Echocardiogram 2D Echocardiogram has been performed.  Timothy Hays 12/18/2016, 3:27 PM

## 2016-12-18 NOTE — ED Notes (Signed)
RT at bedside in attempts ween patient off bipap.

## 2016-12-18 NOTE — Progress Notes (Signed)
[name] a currently receiving home PALLIATIVE services. CMRN Collie Siad made aware. Thank you. Dayna Barker RN, BSN, [facility] [place], [facility] [phone] c

## 2016-12-18 NOTE — Progress Notes (Signed)
ANTICOAGULATION CONSULT NOTE - Initial Consult  Pharmacy Consult for heparin  Indication: chest pain/ACS   Pharmacy consulted for heparin drip management for 83 yo male ICU patient admitted with acute CHF exacerbation and elevated troponins. Patient does not take anticoagulation as an outpatient. Patient ordered heparin 5000 units TID for DVT prophylaxis. No doses have been given this admission.    Goal of Therapy:  Heparin level 0.3-0.7 units/ml Monitor platelets by anticoagulation protocol: Yes   Plan:  Will discontinue heparin 5000 units TID. Will bolus heparin 3250 units and start heparin at 1000 units/hr. Will obtain anti-Xa level at 2330.     Allergies  Allergen Reactions  . Fish Allergy Anaphylaxis  . Keflex [Cephalexin] Nausea And Vomiting    Daughter advises that his vomiting is extremely "voilent and severe".  . Morphine And Related Nausea And Vomiting  . Penicillins     Has patient had a PCN reaction causing immediate rash, facial/tongue/throat swelling,} If all of the above answers are "NO", then may proceed with Cephalosporin use. SOB or lightheadedness with hypotension: No Has patient had a PCN reaction causing severe rash involving mucus membranes or skin necrosis: Unknown Has patient had a PCN reaction that required hospitalization Unknown Has patient had a PCN reaction occurring within the last 10 years: No     Patient Measurements: Height:  (167.6 cm) Weight: 207 lb 14.3 oz (94.3 kg) IBW/kg (Calculated) : 63.8 Heparin Dosing Weight: 81kg   Vital Signs: Temp: 98.8 F (37.1 C) (04/12 1200) Temp Source: Axillary (04/12 1200) BP: 122/63 (04/12 1400) Pulse Rate: 93 (04/12 1400)  Labs:  Recent Labs  12/18/16 0548 12/18/16 0736 12/18/16 1024  HGB 8.4*  --  8.8*  HCT 27.0*  --  28.1*  PLT 235  --  227  CREATININE 2.08*  --  2.12*  TROPONINI 0.19* 0.39* 0.87*    Estimated Creatinine Clearance: 22.4 mL/min (A) (by C-G formula based on SCr of 2.12  mg/dL (H)).   Medical History: Past Medical History:  Diagnosis Date  . Anginal pain (HCC)   . CAD (coronary artery disease)   . CKD (chronic kidney disease), stage III   . Diabetes mellitus without complication (HCC)   . Dyspnea   . GERD (gastroesophageal reflux disease)   . History of gout   . History of MI (myocardial infarction)   . Hyperlipidemia   . Hypertension   . Myocardial infarction   . Peripheral vascular disease (HCC)     Medications:  Scheduled:  . budesonide (PULMICORT) nebulizer solution  0.5 mg Nebulization BID  . [START ON 12/19/2016] cefTRIAXone (ROCEPHIN) IVPB 1 gram/50 mL D5W  1 g Intravenous Q24H  . furosemide  20 mg Intravenous Q12H  . gabapentin  200 mg Oral BID WC  . gabapentin  300 mg Oral QHS  . heparin  3,250 Units Intravenous Once  . insulin aspart  0-9 Units Subcutaneous Q4H  . ipratropium-albuterol  3 mL Nebulization Q4H  . latanoprost  1 drop Right Eye QHS  . methylPREDNISolone (SOLU-MEDROL) injection  40 mg Intravenous Q12H  . pantoprazole  40 mg Oral Daily  . sodium chloride flush  3 mL Intravenous Q12H   Infusions:  . heparin      Pharmacy will continue to monitor and adjust per consult.    Simpson,Michael L 12/18/2016,3:30 PM

## 2016-12-18 NOTE — H&P (Signed)
[facility] [place] [facility]   PATIENT NAME: Timothy Hays    MR#:  [id number]  DATE OF BIRTH:  04/01/1919  DATE OF ADMISSION:  12/18/2016  PRIMARY CARE PHYSICIAN: Leanna Sato, MD   REQUESTING/REFERRING PHYSICIAN: Jeanene Erb  CHIEF COMPLAINT:   Chief Complaint  Patient presents with  . Respiratory Distress    HISTORY OF PRESENT ILLNESS:  Timothy Hays  is a 83 y.o. male with a known history of Coronary artery disease, diabetes, gout, MI, hyperlipidemia- lives in nursing home, seen today with hypoxia from there and found to be hypoxic up to 80% on arrival to ER. Patient also had some confusion started on BiPAP and on chest x-ray he appeared to have pulmonary edema so given IV Lasix and given to hospitalist team for further management of this issue. Patient was not able to give much details but he was complaining of some chest tightness and shortness of breath.  PAST MEDICAL HISTORY:   Past Medical History:  Diagnosis Date  . CAD (coronary artery disease)   . CKD (chronic kidney disease), stage III   . Diabetes mellitus without complication (HCC)   . GERD (gastroesophageal reflux disease)   . History of gout   . History of MI (myocardial infarction)   . Hyperlipidemia     PAST SURGICAL HISTORY:  History reviewed. No pertinent surgical history.  SOCIAL HISTORY:   Social History  Substance Use Topics  . Smoking status: Never Smoker  . Smokeless tobacco: Never Used  . Alcohol use No    FAMILY HISTORY:   Family History  Problem Relation Age of Onset  . Hypertension Father   . Diabetes Neg Hx     DRUG ALLERGIES:   Allergies  Allergen Reactions  . Fish Allergy Anaphylaxis  . Keflex [Cephalexin] Nausea And Vomiting    Daughter advises that his vomiting is extremely "voilent and severe".  . Morphine And Related Nausea And Vomiting  . Penicillins     Has patient had a PCN reaction causing immediate rash, facial/tongue/throat swelling,} If  all of the above answers are "NO", then may proceed with Cephalosporin use. SOB or lightheadedness with hypotension: No Has patient had a PCN reaction causing severe rash involving mucus membranes or skin necrosis: Unknown Has patient had a PCN reaction that required hospitalization Unknown Has patient had a PCN reaction occurring within the last 10 years: No     REVIEW OF SYSTEMS:   Review of Systems  Constitutional: Negative for chills, fever and weight loss.  HENT: Negative for congestion, ear discharge, ear pain and hearing loss.   Eyes: Negative for double vision and discharge.  Respiratory: Positive for shortness of breath. Negative for cough and sputum production.   Cardiovascular: Positive for chest pain. Negative for palpitations and leg swelling.  Gastrointestinal: Negative for abdominal pain, diarrhea, nausea and vomiting.  Genitourinary: Negative for dysuria, frequency and urgency.  Musculoskeletal: Negative for back pain and myalgias.  Skin: Negative for rash.  Neurological: Negative for dizziness, tremors and weakness.  Psychiatric/Behavioral: Negative for depression.    MEDICATIONS AT HOME:   Prior to Admission medications   Medication Sig Start Date End Date Taking? Authorizing Provider  acetaminophen (TYLENOL) 500 MG tablet Take 500 mg by mouth every 4 (four) hours as needed for mild pain.    Yes Historical Provider, MD  albuterol (PROVENTIL HFA;VENTOLIN HFA) 108 (90 Base) MCG/ACT inhaler Inhale 2 puffs into the lungs every 4 (four) hours as needed for wheezing or  shortness of breath.   Yes Historical Provider, MD  azithromycin (ZITHROMAX) 250 MG tablet Take 250-500 mg by mouth daily. Take  on day one, then  for 4 days. 12/17/16 12/21/16 Yes Historical Provider, MD  gabapentin (NEURONTIN) 100 MG capsule Take 200-300 mg by mouth 3 (three) times daily. Take  at 0900 and 1200. Take  at bedtime.   Yes Historical Provider, MD  guaiFENesin-dextromethorphan  (ROBITUSSIN DM) 100-10 MG/5ML syrup Take 10 mLs by mouth every 4 (four) hours as needed for cough.   Yes Historical Provider, MD  Infant Care Products (DERMACLOUD) CREA Apply 1 application topically 2 (two) times daily.   Yes Historical Provider, MD  latanoprost (XALATAN) 0.005 % ophthalmic solution Place 1 drop into the right eye at bedtime.   Yes Historical Provider, MD  metFORMIN (GLUCOPHAGE) 500 MG tablet Take 500 mg by mouth 2 (two) times daily.  06/10/16  Yes Historical Provider, MD  nitroGLYCERIN (NITROSTAT) 0.4 MG SL tablet Place 0.4 mg under the tongue every 5 (five) minutes as needed for chest pain.   Yes Historical Provider, MD  pantoprazole (PROTONIX) 40 MG tablet Take 40 mg by mouth daily.  06/10/16  Yes Historical Provider, MD  polyethylene glycol (MIRALAX / GLYCOLAX) packet Take 17 g by mouth daily as needed for mild constipation.   Yes Historical Provider, MD  Spacer/Aero Chamber Mouthpiece MISC 1 each by Does not apply route every 4 (four) hours as needed (with albuterol).   Yes Historical Provider, MD  sulfamethoxazole-trimethoprim (BACTRIM DS,SEPTRA DS) 800-160 MG tablet Take 1 tablet by mouth 2 (two) times daily. 12/11/16 12/20/16 Yes Historical Provider, MD      VITAL SIGNS:  Blood pressure 124/69, pulse (!) 101, temperature 98.6 F (37 C), temperature source Axillary, resp. rate (!) 22, height  (1.676 m), weight 94.3 kg (207 lb 14.3 oz), SpO2 95 %.  PHYSICAL EXAMINATION:  Physical Exam  GENERAL:  83 y.o.-year-old patient lying in the bed with Use of BiPAP.  EYES: Pupils equal, round, reactive to light and accommodation. No scleral icterus. Extraocular muscles intact.  HEENT: Head atraumatic, normocephalic. Oropharynx and nasopharynx clear.  NECK:  Supple, no jugular venous distention. No thyroid enlargement, no tenderness.  LUNGS: Normal breath sounds bilaterally, some wheezing, bilateral crepitation. Positive use of accessory muscles of respiration. Requiring BiPAP  support. CARDIOVASCULAR: S1, S2 normal. No murmurs, rubs, or gallops.  ABDOMEN: Soft, nontender, nondistended. Bowel sounds present. No organomegaly or mass.  EXTREMITIES: Bilateral pedal edema,no cyanosis, or clubbing.  NEUROLOGIC: Cranial nerves II through XII are intact. Muscle strength 5/5 in all extremities. Sensation intact. Gait not checked.  PSYCHIATRIC: The patient is alert and oriented x 2.  SKIN: No obvious rash, lesion, or ulcer.   LABORATORY PANEL:   CBC  Recent Labs Lab 12/18/16 1024  WBC 5.8  HGB 8.8*  HCT 28.1*  PLT 227   ------------------------------------------------------------------------------------------------------------------  Chemistries   Recent Labs Lab 12/18/16 0548 12/18/16 1024 12/18/16 1259  NA 134*  --   --   K 6.4*  --  4.9  CL 101  --   --   CO2 25  --   --   GLUCOSE 178*  --   --   BUN 36*  --   --   CREATININE 2.08* 2.12*  --   CALCIUM 8.3*  --   --   AST 26  --   --   ALT 15*  --   --   ALKPHOS 67  --   --  BILITOT 0.4  --   --    ------------------------------------------------------------------------------------------------------------------  Cardiac Enzymes  Recent Labs Lab 12/18/16 1024  TROPONINI 0.87*   ------------------------------------------------------------------------------------------------------------------  RADIOLOGY:  Dg Chest Portable 1 View  Result Date: 12/18/2016 CLINICAL DATA:  Acute onset of shortness of breath. Initial encounter. EXAM: PORTABLE CHEST 1 VIEW COMPARISON:  Chest radiograph performed 09/02/2016 FINDINGS: The lungs are well-aerated. Vascular congestion is noted. Increased interstitial markings raise concern for pulmonary edema. There is no evidence of pleural effusion or pneumothorax. The cardiomediastinal silhouette is mildly enlarged. No acute osseous abnormalities are seen. IMPRESSION: Vascular congestion and mild cardiomegaly. Increased interstitial markings raise concern for  pulmonary edema. Electronically Signed   By: Roanna Raider M.D.   On: 12/18/2016 06:05      IMPRESSION AND PLAN:   * Acute hypoxic respiratory failure   Acute CHF exacerbation    We do not have previous echocardiogram so unable to decide systolic versus diastolic at this point.   IV Lasix, continue BiPAP support, get intensivist consult.   Intake and output monitoring, strict fluid restriction orally, daily weight, cardiology consult.   Monitor on telemetry, follow serial troponin.  * Acute on chronic renal failure with hyperkalemia   Kayexalate given.   No EKG changes   Follow renal function and potassium level.  * Diabetes   Hold metformin because of renal failure, keep on sliding scale coverage.  * Elevated troponin   Likely stress ischemia, follow serial troponin.  * Anemia   Likely due to chronic renal failure, continue monitoring.  * Urinary tract infection   IV ceftriaxone, follow urine culture.   All the records are reviewed and case discussed with ED provider. Management plans discussed with the patient, family and they are in agreement.  CODE STATUS: DO NOT RESUSCITATE  TOTAL TIME TAKING CARE OF THIS PATIENT: 50 critical care minutes.   Discussed plan about his admission and further management with his daughter on phone.  Altamese Dilling M.D on 12/18/2016 at 1:58 PM  Between 7am to 6pm - Pager - [phone]  After 6pm go to [email or web address] - Social research officer, government  [facility]  Office  [phone]  CC: Primary care physician; Leanna Sato, MD   Note: This dictation was prepared with Dragon dictation along with smaller phrase technology. Any transcriptional errors that result from this process are unintentional.

## 2016-12-19 ENCOUNTER — Inpatient Hospital Stay: Payer: Medicare Other

## 2016-12-19 LAB — PROCALCITONIN: PROCALCITONIN: 0.15 ng/mL

## 2016-12-19 LAB — CBC WITH DIFFERENTIAL/PLATELET
BASOS ABS: 0 10*3/uL (ref 0–0.1)
Basophils Relative: 0 %
EOS PCT: 0 %
Eosinophils Absolute: 0 10*3/uL (ref 0–0.7)
HCT: 25.7 % — ABNORMAL LOW (ref 40.0–52.0)
HEMOGLOBIN: 8.1 g/dL — AB (ref 13.0–18.0)
LYMPHS ABS: 0.3 10*3/uL — AB (ref 1.0–3.6)
LYMPHS PCT: 5 %
MCH: 24.7 pg — ABNORMAL LOW (ref 26.0–34.0)
MCHC: 31.3 g/dL — ABNORMAL LOW (ref 32.0–36.0)
MCV: 78.9 fL — AB (ref 80.0–100.0)
Monocytes Absolute: 0.1 10*3/uL — ABNORMAL LOW (ref 0.2–1.0)
Monocytes Relative: 2 %
NEUTROS PCT: 93 %
Neutro Abs: 5 10*3/uL (ref 1.4–6.5)
PLATELETS: 218 10*3/uL (ref 150–440)
RBC: 3.26 MIL/uL — AB (ref 4.40–5.90)
RDW: 17.1 % — ABNORMAL HIGH (ref 11.5–14.5)
WBC: 5.4 10*3/uL (ref 3.8–10.6)

## 2016-12-19 LAB — BASIC METABOLIC PANEL
ANION GAP: 7 (ref 5–15)
BUN: 35 mg/dL — ABNORMAL HIGH (ref 6–20)
CHLORIDE: 102 mmol/L (ref 101–111)
CO2: 27 mmol/L (ref 22–32)
CREATININE: 1.71 mg/dL — AB (ref 0.61–1.24)
Calcium: 8.5 mg/dL — ABNORMAL LOW (ref 8.9–10.3)
GFR calc non Af Amer: 32 mL/min — ABNORMAL LOW (ref >60)
GFR, EST AFRICAN AMERICAN: 37 mL/min — AB (ref >60)
Glucose, Bld: 160 mg/dL — ABNORMAL HIGH (ref 65–99)
POTASSIUM: 4.5 mmol/L (ref 3.5–5.1)
SODIUM: 136 mmol/L (ref 135–145)

## 2016-12-19 LAB — GLUCOSE, CAPILLARY
GLUCOSE-CAPILLARY: 147 mg/dL — AB (ref 65–99)
GLUCOSE-CAPILLARY: 161 mg/dL — AB (ref 65–99)
GLUCOSE-CAPILLARY: 181 mg/dL — AB (ref 65–99)
Glucose-Capillary: 146 mg/dL — ABNORMAL HIGH (ref 65–99)
Glucose-Capillary: 147 mg/dL — ABNORMAL HIGH (ref 65–99)
Glucose-Capillary: 153 mg/dL — ABNORMAL HIGH (ref 65–99)

## 2016-12-19 LAB — HEPARIN LEVEL (UNFRACTIONATED)
HEPARIN UNFRACTIONATED: 0.42 [IU]/mL (ref 0.30–0.70)
Heparin Unfractionated: 0.38 IU/mL (ref 0.30–0.70)

## 2016-12-19 LAB — URINE CULTURE

## 2016-12-19 LAB — TROPONIN I
TROPONIN I: 1.22 ng/mL — AB (ref <0.03)
Troponin I: 1.2 ng/mL (ref <0.03)
Troponin I: 1.2 ng/mL (ref <0.03)

## 2016-12-19 MED ORDER — FUROSEMIDE 10 MG/ML IJ SOLN
40.0000 mg | Freq: Once | INTRAMUSCULAR | Status: AC
  Administered 2016-12-19: 40 mg via INTRAVENOUS
  Filled 2016-12-19: qty 4

## 2016-12-19 MED ORDER — BENZONATATE 100 MG PO CAPS
100.0000 mg | ORAL_CAPSULE | Freq: Two times a day (BID) | ORAL | Status: DC | PRN
  Administered 2016-12-19 – 2016-12-20 (×2): 100 mg via ORAL
  Filled 2016-12-19 (×2): qty 1

## 2016-12-19 MED ORDER — IPRATROPIUM-ALBUTEROL 0.5-2.5 (3) MG/3ML IN SOLN
3.0000 mL | Freq: Once | RESPIRATORY_TRACT | Status: AC
  Administered 2016-12-19: 3 mL via RESPIRATORY_TRACT
  Filled 2016-12-19: qty 3

## 2016-12-19 MED ORDER — GUAIFENESIN-CODEINE 100-10 MG/5ML PO SOLN
10.0000 mL | Freq: Four times a day (QID) | ORAL | Status: DC | PRN
  Administered 2016-12-19: 10 mL via ORAL
  Filled 2016-12-19: qty 10

## 2016-12-19 MED ORDER — METHYLPREDNISOLONE SODIUM SUCC 125 MG IJ SOLR
60.0000 mg | Freq: Once | INTRAMUSCULAR | Status: AC
  Administered 2016-12-19: 60 mg via INTRAVENOUS
  Filled 2016-12-19: qty 2

## 2016-12-19 NOTE — Care Management (Signed)
[name] open to palliative at [facility] (not [facility]).

## 2016-12-19 NOTE — Progress Notes (Signed)
ANTICOAGULATION CONSULT NOTE - Initial Consult  Pharmacy Consult for heparin  Indication: chest pain/ACS   Pharmacy consulted for heparin drip management for 83 yo male ICU patient admitted with acute CHF exacerbation and elevated troponins. Patient does not take anticoagulation as an outpatient. Patient currently receiving heparin drip at 1000 units/hr.   Goal of Therapy:  Heparin level 0.3-0.7 units/ml Monitor platelets by anticoagulation protocol: Yes   Plan:  Continue heparin at 1000 unit/hr. Patient has two consecutive anti-Xa levels in range, will obtain next level with am labs.    Allergies  Allergen Reactions  . Fish Allergy Anaphylaxis  . Keflex [Cephalexin] Nausea And Vomiting    Daughter advises that his vomiting is extremely "voilent and severe".  . Morphine And Related Nausea And Vomiting  . Penicillins     Has patient had a PCN reaction causing immediate rash, facial/tongue/throat swelling,} If all of the above answers are "NO", then may proceed with Cephalosporin use. SOB or lightheadedness with hypotension: No Has patient had a PCN reaction causing severe rash involving mucus membranes or skin necrosis: Unknown Has patient had a PCN reaction that required hospitalization Unknown Has patient had a PCN reaction occurring within the last 10 years: No     Patient Measurements: Height:  (167.6 cm) Weight: 209 lb 3.5 oz (94.9 kg) IBW/kg (Calculated) : 63.8 Heparin Dosing Weight: 81kg   Vital Signs: Temp: 97.7 F (36.5 C) (04/13 0500) Temp Source: Axillary (04/13 0500)  Labs:  Recent Labs  12/18/16 0548  12/18/16 1024 12/18/16 1608  12/18/16 2353 12/19/16 0326 12/19/16 0734  HGB 8.4*  --  8.8*  --   --   --  8.1*  --   HCT 27.0*  --  28.1*  --   --   --  25.7*  --   PLT 235  --  227  --   --   --  218  --   APTT  --   --   --  111*  --   --   --   --   LABPROT  --   --   --  15.0  --   --   --   --   INR  --   --   --  1.17  --   --   --   --    HEPARINUNFRC  --   --   --   --   --  0.38  --  0.42  CREATININE 2.08*  --  2.12*  --   --   --  1.71*  --   TROPONINI 0.19*  < > 0.87* 1.16*  < > 1.20* 1.20* 1.22*  < > = values in this interval not displayed.  Estimated Creatinine Clearance: 27.9 mL/min (A) (by C-G formula based on SCr of 1.71 mg/dL (H)).   Medical History: Past Medical History:  Diagnosis Date  . Anginal pain (HCC)   . CAD (coronary artery disease)   . CKD (chronic kidney disease), stage III   . Diabetes mellitus without complication (HCC)   . Dyspnea   . GERD (gastroesophageal reflux disease)   . History of gout   . History of MI (myocardial infarction)   . Hyperlipidemia   . Hypertension   . Myocardial infarction   . Peripheral vascular disease (HCC)     Medications:  Scheduled:  . budesonide (PULMICORT) nebulizer solution  0.5 mg Nebulization BID  . cefTRIAXone (ROCEPHIN) IVPB 1 gram/50 mL D5W  1 g Intravenous Q24H  . furosemide  20 mg Intravenous Q12H  . gabapentin  200 mg Oral BID WC  . gabapentin  300 mg Oral QHS  . insulin aspart  0-9 Units Subcutaneous Q4H  . ipratropium-albuterol  3 mL Nebulization Q4H  . latanoprost  1 drop Right Eye QHS  . methylPREDNISolone (SOLU-MEDROL) injection  40 mg Intravenous Q12H  . pantoprazole  40 mg Oral Daily  . sodium chloride flush  3 mL Intravenous Q12H   Infusions:  . heparin 1,000 Units/hr (12/19/16 0600)    Pharmacy will continue to monitor and adjust per consult.    Simpson,Michael L 12/19/2016,10:44 AM

## 2016-12-19 NOTE — Progress Notes (Signed)
ANTICOAGULATION CONSULT NOTE - Initial Consult  Pharmacy Consult for heparin  Indication: chest pain/ACS   Pharmacy consulted for heparin drip management for 83 yo male ICU patient admitted with acute CHF exacerbation and elevated troponins. Patient does not take anticoagulation as an outpatient. Patient ordered heparin 5000 units TID for DVT prophylaxis. No doses have been given this admission.    Goal of Therapy:  Heparin level 0.3-0.7 units/ml Monitor platelets by anticoagulation protocol: Yes   Plan:  Will discontinue heparin 5000 units TID. Will bolus heparin 3250 units and start heparin at 1000 units/hr. Will obtain anti-Xa level at 2330.    4/12 @ 2356 HL: 0.38 therapeutic will continue current regimen and recheck HL @ 0800. Will continue to monitor CBC.        Allergies  Allergen Reactions  . Fish Allergy Anaphylaxis  . Keflex [Cephalexin] Nausea And Vomiting    Daughter advises that his vomiting is extremely "voilent and severe".  . Morphine And Related Nausea And Vomiting  . Penicillins     Has patient had a PCN reaction causing immediate rash, facial/tongue/throat swelling,} If all of the above answers are "NO", then may proceed with Cephalosporin use. SOB or lightheadedness with hypotension: No Has patient had a PCN reaction causing severe rash involving mucus membranes or skin necrosis: Unknown Has patient had a PCN reaction that required hospitalization Unknown Has patient had a PCN reaction occurring within the last 10 years: No     Patient Measurements: Height:  (167.6 cm) Weight: 207 lb 14.3 oz (94.3 kg) IBW/kg (Calculated) : 63.8 Heparin Dosing Weight: 81kg   Vital Signs: Temp: 98.8 F (37.1 C) (04/12 1200) Temp Source: Axillary (04/12 1200) BP: 122/63 (04/12 1400) Pulse Rate: 93 (04/12 1400)  Labs:  Recent Labs (last 2 labs)    Recent Labs  12/18/16 0548 12/18/16 0736 12/18/16 1024  HGB 8.4*  --  8.8*  HCT 27.0*  --   28.1*  PLT 235  --  227  CREATININE 2.08*  --  2.12*  TROPONINI 0.19* 0.39* 0.87*      Estimated Creatinine Clearance: 22.4 mL/min (A) (by C-G formula based on SCr of 2.12 mg/dL (H)).   Medical History:     Past Medical History:  Diagnosis Date  . Anginal pain (HCC)   . CAD (coronary artery disease)   . CKD (chronic kidney disease), stage III   . Diabetes mellitus without complication (HCC)   . Dyspnea   . GERD (gastroesophageal reflux disease)   . History of gout   . History of MI (myocardial infarction)   . Hyperlipidemia   . Hypertension   . Myocardial infarction   . Peripheral vascular disease (HCC)     Medications:  Scheduled:  . budesonide (PULMICORT) nebulizer solution  0.5 mg Nebulization BID  . [START ON 12/19/2016] cefTRIAXone (ROCEPHIN) IVPB 1 gram/50 mL D5W  1 g Intravenous Q24H  . furosemide  20 mg Intravenous Q12H  . gabapentin  200 mg Oral BID WC  . gabapentin  300 mg Oral QHS  . heparin  3,250 Units Intravenous Once  . insulin aspart  0-9 Units Subcutaneous Q4H  . ipratropium-albuterol  3 mL Nebulization Q4H  . latanoprost  1 drop Right Eye QHS  . methylPREDNISolone (SOLU-MEDROL) injection  40 mg Intravenous Q12H  . pantoprazole  40 mg Oral Daily  . sodium chloride flush  3 mL Intravenous Q12H   Infusions:  . heparin      Pharmacy will continue to  monitor and adjust per consult.   Thomasene Ripple, PharmD, BCPS Clinical Pharmacist 12/19/2016

## 2016-12-19 NOTE — Progress Notes (Signed)
[employer] [facility] - [place] at [facility]   PATIENT NAME: Timothy Hays    MR#:  [id number]  DATE OF BIRTH:  1918-10-10  SUBJECTIVE:  CHIEF COMPLAINT:   Chief Complaint  Patient presents with  . Respiratory Distress   Came with hypoxia and respi distress.   Now off bipap, and more alert.  REVIEW OF SYSTEMS:  CONSTITUTIONAL: No fever, fatigue or weakness.  EYES: No blurred or double vision.  EARS, NOSE, AND THROAT: No tinnitus or ear pain.  RESPIRATORY: positive for cough, shortness of breath, no wheezing or hemoptysis.  CARDIOVASCULAR: No chest pain, orthopnea, edema.  GASTROINTESTINAL: No nausea, vomiting, diarrhea or abdominal pain.  GENITOURINARY: No dysuria, hematuria.  ENDOCRINE: No polyuria, nocturia,  HEMATOLOGY: No anemia, easy bruising or bleeding SKIN: No rash or lesion. MUSCULOSKELETAL: No joint pain or arthritis.   NEUROLOGIC: No tingling, numbness, weakness.  PSYCHIATRY: No anxiety or depression.   ROS  DRUG ALLERGIES:   Allergies  Allergen Reactions  . Fish Allergy Anaphylaxis  . Keflex [Cephalexin] Nausea And Vomiting    Daughter advises that his vomiting is extremely "voilent and severe".  . Morphine And Related Nausea And Vomiting  . Penicillins     Has patient had a PCN reaction causing immediate rash, facial/tongue/throat swelling,} If all of the above answers are "NO", then may proceed with Cephalosporin use. SOB or lightheadedness with hypotension: No Has patient had a PCN reaction causing severe rash involving mucus membranes or skin necrosis: Unknown Has patient had a PCN reaction that required hospitalization Unknown Has patient had a PCN reaction occurring within the last 10 years: No     VITALS:  Blood pressure (!) 157/79, pulse 99, temperature 97.7 F (36.5 C), temperature source Axillary, resp. rate 20, height  (1.676 m), weight 94.9 kg (209 lb 3.5 oz), SpO2 97 %.  PHYSICAL EXAMINATION:  GENERAL:  83 y.o.-year-old patient  lying in the bed with no acute distress.  EYES: Pupils equal, round, reactive to light and accommodation. No scleral icterus. Extraocular muscles intact.  HEENT: Head atraumatic, normocephalic. Oropharynx and nasopharynx clear.  NECK:  Supple, no jugular venous distention. No thyroid enlargement, no tenderness.  LUNGS: Normal breath sounds bilaterally, no wheezing, some crepitation. No use of accessory muscles of respiration.  CARDIOVASCULAR: S1, S2 normal. No murmurs, rubs, or gallops.  ABDOMEN: Soft, nontender, nondistended. Bowel sounds present. No organomegaly or mass.  EXTREMITIES: b/l pedal edema,no cyanosis, or clubbing.  NEUROLOGIC: Cranial nerves II through XII are intact. Muscle strength 5/5 in all extremities. Sensation intact. Gait not checked.  PSYCHIATRIC: The patient is alert and oriented x 3.  SKIN: No obvious rash, lesion, or ulcer.   Physical Exam LABORATORY PANEL:   CBC  Recent Labs Lab 12/19/16 0326  WBC 5.4  HGB 8.1*  HCT 25.7*  PLT 218   ------------------------------------------------------------------------------------------------------------------  Chemistries   Recent Labs Lab 12/18/16 0548  12/19/16 0326  NA 134*  --  136  K 6.4*  < > 4.5  CL 101  --  102  CO2 25  --  27  GLUCOSE 178*  --  160*  BUN 36*  --  35*  CREATININE 2.08*  < > 1.71*  CALCIUM 8.3*  --  8.5*  AST 26  --   --   ALT 15*  --   --   ALKPHOS 67  --   --   BILITOT 0.4  --   --   < > = values in this interval  not displayed. ------------------------------------------------------------------------------------------------------------------  Cardiac Enzymes  Recent Labs Lab 12/19/16 0326 12/19/16 0734  TROPONINI 1.20* 1.22*   ------------------------------------------------------------------------------------------------------------------  RADIOLOGY:  Dg Chest Port 1 View  Result Date: 12/19/2016 CLINICAL DATA:  Acute congestive heart failure. EXAM: PORTABLE CHEST 1  VIEW COMPARISON:  Radiograph of December 18, 2016. FINDINGS: Stable cardiomediastinal silhouette. No pneumothorax or pleural effusion is noted. Stable interstitial densities are noted throughout both lungs concerning for pulmonary edema. Bony thorax is unremarkable. Atherosclerosis of thoracic aorta is noted. IMPRESSION: Stable bilateral interstitial densities are noted consistent with pulmonary edema. Aortic atherosclerosis. Electronically Signed   By: Lupita Raider, M.D.   On: 12/19/2016 07:36   Dg Chest Portable 1 View  Result Date: 12/18/2016 CLINICAL DATA:  Acute onset of shortness of breath. Initial encounter. EXAM: PORTABLE CHEST 1 VIEW COMPARISON:  Chest radiograph performed 09/02/2016 FINDINGS: The lungs are well-aerated. Vascular congestion is noted. Increased interstitial markings raise concern for pulmonary edema. There is no evidence of pleural effusion or pneumothorax. The cardiomediastinal silhouette is mildly enlarged. No acute osseous abnormalities are seen. IMPRESSION: Vascular congestion and mild cardiomegaly. Increased interstitial markings raise concern for pulmonary edema. Electronically Signed   By: Roanna Raider M.D.   On: 12/18/2016 06:05    ASSESSMENT AND PLAN:   Principal Problem:   Acute CHF (HCC) Active Problems:   Acute CHF (congestive heart failure) (HCC)  * Acute hypoxic respiratory failure   Acute CHF exacerbation   do not have previous echocardiogram so unable to decide systolic versus diastolic at this point.   IV Lasix, continue BiPAP support, get intensivist consult.   Intake and output monitoring, strict fluid restriction orally, daily weight,appreciated cardiology consult.   Monitor on telemetry, follow serial troponin.  * Acute on chronic renal failure with hyperkalemia   Kayexalate given.   No EKG changes   Follow renal function and potassium level.   Improved.  * Diabetes   Hold metformin because of renal failure, keep on sliding scale  coverage.  * Elevated troponin   Likely stress ischemia, follow serial troponin.   Started on heparine drip, cardiology to decide about interventions.  * Anemia   Likely due to chronic renal failure, continue monitoring.  * Urinary tract infection   IV ceftriaxone, follow urine culture.     All the records are reviewed and case discussed with Care Management/Social Workerr. Management plans discussed with the patient, family and they are in agreement.  CODE STATUS: DNR  TOTAL TIME TAKING CARE OF THIS PATIENT: 35 minutes.   Discussed with his niece in room todayu.  POSSIBLE D/C IN 1-2 DAYS, DEPENDING ON CLINICAL CONDITION.   Altamese Dilling M.D on 12/19/2016   Between 7am to 6pm - Pager - [phone]  After 6pm go to [email or web address] - password [email or web address]  [facility]  Office  [phone]  CC: Primary care physician; Leanna Sato, MD  Note: This dictation was prepared with Dragon dictation along with smaller phrase technology. Any transcriptional errors that result from this process are unintentional.

## 2016-12-19 NOTE — Care Management Note (Signed)
Case Management Note  Patient Details  Name: Timothy Hays MRN: [id number] Date of Birth: 15-Nov-1918  Subjective/Objective:                  RNCM consult received and will follow for [facility] and DME. [name] from long-term care at [facility] [phone]. He resides in the assisted living. [address] [phone] HCPOA/only daughter. I spoke with Morrie Sheldon at [facility] and she states that patient is new to their community. He is not on O2 at their facility however he does have a new nebulizer. He is wheelchair bound and uses [employer] for sit to stand- he cannot walk. His PCP is with [facility]. He is receiving home health services through [place]. Patient had hospice which they state was recently closed out and transitioned to palliative/[facility].   Action/Plan:   No current RNCM needs but will follow.    Expected Discharge Date:                  Expected Discharge Plan:     In-House Referral:     Discharge planning Services     Post Acute Care Choice:    Choice offered to:     DME Arranged:    DME Agency:     HH Arranged:    HH Agency:     Status of Service:     If discussed at [employer], dates discussed:    Additional Comments:  Collie Siad, RN 12/19/2016, 9:16 AM

## 2016-12-19 NOTE — Progress Notes (Signed)
Protective cushion placed on pt's nose.

## 2016-12-19 NOTE — Progress Notes (Addendum)
  [facility] Critical [facility] Medicine Progess Note   ASSESSMENT/PLAN   Hypoxemic respiratory failure. Combination of congestive heart failure with bronchospasm. Presently on noninvasive ventilation, Rocephin, albuterol, Atrovent, Solu-Medrol, budesonide, diuretic therapy as tolerated. Cardiology has been consulted. [name] DO NOT RESUSCITATE, will perform trial of high flow oxygen off of noninvasive ventilation  Positive troponins. Patient with right bundle branch block left anterior fascicular block, positive troponins at 1.2. Presently on heparin infusion  Anemia. No evidence of active bleeding  Renal insufficiency. Presently 35/1.71. This represents some improvement from yesterday which was 36/2.08  Hyperglycemia. On sliding scale coverage  Name: Timothy Hays MRN: [id number] DOB: Sep 10, 1918    ADMISSION DATE:  12/18/2016  SUBJECTIVE:   [name] presently on noninvasive ventilation. States that he is doing somewhat better, has not been tried off of BiPAP.  VITAL SIGNS: Temp:  [97.7 F (36.5 C)-98.8 F (37.1 C)] 97.7 F (36.5 C) (04/13 0500) Pulse Rate:  [90-106] 93 (04/12 1900) Resp:  [15-25] 19 (04/12 1900) BP: (110-136)/(61-95) 125/71 (04/12 1900) SpO2:  [92 %-100 %] 98 % (04/13 0427) FiO2 (%):  [28 %] 28 % (04/13 0427) Weight:  [94.3 kg (207 lb 14.3 oz)-94.9 kg (209 lb 3.5 oz)] 94.9 kg (209 lb 3.5 oz) (04/13 0500)   PHYSICAL EXAMINATION: Physical Examination:   VS: BP 125/71   Pulse 93   Temp 97.7 F (36.5 C) (Axillary)   Resp 19   Ht  (1.676 m)   Wt 94.9 kg (209 lb 3.5 oz)   SpO2 98%   BMI 33.77 kg/m   General Appearance: No distress  Neuro:without focal findings, mental status normal. HEENT: PERRLA, EOM intact. Pulmonary: Inspiratory rales appreciated bilateral posterior, expiratory wheezing also noted with prolonged expiratory phase Cardiovascular Normal S1,S2.  No m/r/g.   Abdomen: Benign, Soft, non-tender. Endocrine: No evident  thyromegaly. Skin:   warm, no rashes, no ecchymosis  Musculoskeletal: Bilateral lower extremity edema noted 2-3+   Tora Kindred, DO  ICU Pager: [phone] [place] [facility] Office Number: [phone]   12/19/2016

## 2016-12-19 NOTE — Progress Notes (Signed)
Pharmacy Antibiotic Note  Timothy Hays is a 83 y.o. male admitted on 12/18/2016 with cellulitis and UTI.  Pharmacy has been consulted for ceftriaxone dosing.  Plan: Ceftriaxone 1g IV Q24hr. Patient with allergy to cephalexin (nausea and vomiting); patient tolerating ceftriaxone without issue.   Height:  (167.6 cm) Weight: 209 lb 3.5 oz (94.9 kg) IBW/kg (Calculated) : 63.8  Temp (24hrs), Avg:98.2 F (36.8 C), Min:97.7 F (36.5 C), Max:98.8 F (37.1 C)   Recent Labs Lab 12/18/16 0548 12/18/16 1024 12/19/16 0326  WBC 6.8 5.8 5.4  CREATININE 2.08* 2.12* 1.71*  LATICACIDVEN 1.9 5.1*  --     Estimated Creatinine Clearance: 27.9 mL/min (A) (by C-G formula based on SCr of 1.71 mg/dL (H)).    Allergies  Allergen Reactions  . Fish Allergy Anaphylaxis  . Keflex [Cephalexin] Nausea And Vomiting    Daughter advises that his vomiting is extremely "voilent and severe".  . Morphine And Related Nausea And Vomiting  . Penicillins     Has patient had a PCN reaction causing immediate rash, facial/tongue/throat swelling,} If all of the above answers are "NO", then may proceed with Cephalosporin use. SOB or lightheadedness with hypotension: No Has patient had a PCN reaction causing severe rash involving mucus membranes or skin necrosis: Unknown Has patient had a PCN reaction that required hospitalization Unknown Has patient had a PCN reaction occurring within the last 10 years: No     Antimicrobials this admission: Ceftriaxone 4/12 >>   Dose adjustments this admission: N/A  Microbiology results: 4/12 UCx: Sent  4/12 MRSA PCR: negative  4/13 Procalcitonin: 0.15  Pharmacy will continue to monitor and adjust per consult.    Simpson,Michael L 12/19/2016 10:49 AM

## 2016-12-20 ENCOUNTER — Inpatient Hospital Stay: Payer: Medicare Other

## 2016-12-20 LAB — CBC
HEMATOCRIT: 24.6 % — AB (ref 40.0–52.0)
HEMOGLOBIN: 7.9 g/dL — AB (ref 13.0–18.0)
MCH: 25 pg — AB (ref 26.0–34.0)
MCHC: 32.1 g/dL (ref 32.0–36.0)
MCV: 77.9 fL — ABNORMAL LOW (ref 80.0–100.0)
Platelets: 218 10*3/uL (ref 150–440)
RBC: 3.17 MIL/uL — ABNORMAL LOW (ref 4.40–5.90)
RDW: 17.1 % — ABNORMAL HIGH (ref 11.5–14.5)
WBC: 4.9 10*3/uL (ref 3.8–10.6)

## 2016-12-20 LAB — PROCALCITONIN: Procalcitonin: 0.15 ng/mL

## 2016-12-20 LAB — GLUCOSE, CAPILLARY
GLUCOSE-CAPILLARY: 149 mg/dL — AB (ref 65–99)
GLUCOSE-CAPILLARY: 153 mg/dL — AB (ref 65–99)
GLUCOSE-CAPILLARY: 155 mg/dL — AB (ref 65–99)
GLUCOSE-CAPILLARY: 171 mg/dL — AB (ref 65–99)
Glucose-Capillary: 144 mg/dL — ABNORMAL HIGH (ref 65–99)
Glucose-Capillary: 165 mg/dL — ABNORMAL HIGH (ref 65–99)

## 2016-12-20 LAB — BASIC METABOLIC PANEL
Anion gap: 9 (ref 5–15)
BUN: 47 mg/dL — AB (ref 6–20)
CHLORIDE: 98 mmol/L — AB (ref 101–111)
CO2: 27 mmol/L (ref 22–32)
CREATININE: 1.7 mg/dL — AB (ref 0.61–1.24)
Calcium: 8.4 mg/dL — ABNORMAL LOW (ref 8.9–10.3)
GFR calc Af Amer: 38 mL/min — ABNORMAL LOW (ref >60)
GFR calc non Af Amer: 32 mL/min — ABNORMAL LOW (ref >60)
Glucose, Bld: 173 mg/dL — ABNORMAL HIGH (ref 65–99)
Potassium: 4.5 mmol/L (ref 3.5–5.1)
Sodium: 134 mmol/L — ABNORMAL LOW (ref 135–145)

## 2016-12-20 LAB — TROPONIN I: Troponin I: 0.87 ng/mL (ref <0.03)

## 2016-12-20 LAB — HEPARIN LEVEL (UNFRACTIONATED): Heparin Unfractionated: 0.34 IU/mL (ref 0.30–0.70)

## 2016-12-20 NOTE — Progress Notes (Signed)
Protective gel pad placed on bridge of nose

## 2016-12-20 NOTE — Progress Notes (Signed)
Pt placed on Bipap for sleep per M. Luci Bank, NP request

## 2016-12-20 NOTE — Progress Notes (Signed)
[employer] - [place] at [facility]   PATIENT NAME: Timothy Hays    MR#:  [id number]  DATE OF BIRTH:  [birth year]  SUBJECTIVE:  CHIEF COMPLAINT:   Chief Complaint  Patient presents with  . Respiratory Distress   Came with hypoxia and respi distress.   Failed weaning bipap yesterday- and had to restart on that.   Today- again off bipap when I saw, but had wheezing- likely still need bipap.  REVIEW OF SYSTEMS:  CONSTITUTIONAL: No fever, fatigue or weakness.  EYES: No blurred or double vision.  EARS, NOSE, AND THROAT: No tinnitus or ear pain.  RESPIRATORY: positive for cough, shortness of breath, no wheezing or hemoptysis.  CARDIOVASCULAR: No chest pain, orthopnea, edema.  GASTROINTESTINAL: No nausea, vomiting, diarrhea or abdominal pain.  GENITOURINARY: No dysuria, hematuria.  ENDOCRINE: No polyuria, nocturia,  HEMATOLOGY: No anemia, easy bruising or bleeding SKIN: No rash or lesion. MUSCULOSKELETAL: No joint pain or arthritis.   NEUROLOGIC: No tingling, numbness, weakness.  PSYCHIATRY: No anxiety or depression.   ROS  DRUG ALLERGIES:   Allergies  Allergen Reactions  . Fish Allergy Anaphylaxis  . Keflex [Cephalexin] Nausea And Vomiting    Daughter advises that his vomiting is extremely "voilent and severe".  . Morphine And Related Nausea And Vomiting  . Penicillins     Has patient had a PCN reaction causing immediate rash, facial/tongue/throat swelling,} If all of the above answers are "NO", then may proceed with Cephalosporin use. SOB or lightheadedness with hypotension: No Has patient had a PCN reaction causing severe rash involving mucus membranes or skin necrosis: Unknown Has patient had a PCN reaction that required hospitalization Unknown Has patient had a PCN reaction occurring within the last 10 years: No     VITALS:  Blood pressure (!) 152/87, pulse 90, temperature 97.9 F (36.6 C), temperature source Oral, resp. rate 19, height  (1.676  m), weight 97.5 kg (214 lb 15.2 oz), SpO2 95 %.  PHYSICAL EXAMINATION:  GENERAL:  83 y.o.-year-old patient lying in the bed with no acute distress.  EYES: Pupils equal, round, reactive to light and accommodation. No scleral icterus. Extraocular muscles intact.  HEENT: Head atraumatic, normocephalic. Oropharynx and nasopharynx clear.  NECK:  Supple, no jugular venous distention. No thyroid enlargement, no tenderness.  LUNGS: Normal breath sounds bilaterally, have b/l wheezing, some crepitation. No use of accessory muscles of respiration.  CARDIOVASCULAR: S1, S2 normal. No murmurs, rubs, or gallops.  ABDOMEN: Soft, nontender, nondistended. Bowel sounds present. No organomegaly or mass.  EXTREMITIES: b/l pedal edema,no cyanosis, or clubbing.  NEUROLOGIC: Cranial nerves II through XII are intact. Muscle strength 5/5 in all extremities. Sensation intact. Gait not checked.  PSYCHIATRIC: The patient is alert and oriented x 3.  SKIN: No obvious rash, lesion, or ulcer.   Physical Exam LABORATORY PANEL:   CBC  Recent Labs Lab 12/20/16 0641  WBC 4.9  HGB 7.9*  HCT 24.6*  PLT 218   ------------------------------------------------------------------------------------------------------------------  Chemistries   Recent Labs Lab 12/18/16 0548  12/20/16 0641  NA 134*  < > 134*  K 6.4*  < > 4.5  CL 101  < > 98*  CO2 25  < > 27  GLUCOSE 178*  < > 173*  BUN 36*  < > 47*  CREATININE 2.08*  < > 1.70*  CALCIUM 8.3*  < > 8.4*  AST 26  --   --   ALT 15*  --   --   ALKPHOS 67  --   --  BILITOT 0.4  --   --   < > = values in this interval not displayed. ------------------------------------------------------------------------------------------------------------------  Cardiac Enzymes  Recent Labs Lab 12/19/16 0734 12/20/16 0641  TROPONINI 1.22* 0.87*   ------------------------------------------------------------------------------------------------------------------  RADIOLOGY:  Dg  Chest Port 1 View  Result Date: 12/20/2016 CLINICAL DATA:  Pulmonary edema.  Respiratory distress. EXAM: PORTABLE CHEST 1 VIEW COMPARISON:  One-view chest x-ray 12/19/2016. FINDINGS: The heart size is exaggerate by low lung volumes. There is a slight increase and bilateral perihilar edema. Bibasilar airspace disease likely reflects atelectasis left greater than right. No other significant consolidation is present. The visualized soft tissues and bony thorax are unremarkable. IMPRESSION: 1. The slight increase and bilateral perihilar edema. 2. Left greater than right airspace disease likely reflects atelectasis. Electronically Signed   By: Marin Roberts M.D.   On: 12/20/2016 07:48   Dg Chest Port 1 View  Result Date: 12/19/2016 CLINICAL DATA:  Acute congestive heart failure. EXAM: PORTABLE CHEST 1 VIEW COMPARISON:  Radiograph of December 18, 2016. FINDINGS: Stable cardiomediastinal silhouette. No pneumothorax or pleural effusion is noted. Stable interstitial densities are noted throughout both lungs concerning for pulmonary edema. Bony thorax is unremarkable. Atherosclerosis of thoracic aorta is noted. IMPRESSION: Stable bilateral interstitial densities are noted consistent with pulmonary edema. Aortic atherosclerosis. Electronically Signed   By: Lupita Raider, M.D.   On: 12/19/2016 07:36    ASSESSMENT AND PLAN:   Principal Problem:   Acute CHF (HCC) Active Problems:   Acute CHF (congestive heart failure) (HCC)  * Acute hypoxic respiratory failure   Acute CHF exacerbation   do not have previous echocardiogram so unable to decide systolic versus diastolic at this point.   IV Lasix, continue BiPAP support, intensivist consult.   Intake and output monitoring, strict fluid restriction orally, daily weight,appreciated cardiology consult.   Monitor on telemetry, followed serial troponin.  * Acute on chronic renal failure with hyperkalemia   Kayexalate given.   No EKG changes   Follow  renal function and potassium level.   Improved.  * ac COPD exacerbation   manage per intensivist.   Steroids, nebs and bipap.  * Diabetes   Hold metformin because of renal failure, keep on sliding scale coverage.  * Elevated troponin   Likely stress ischemia, follow serial troponin.   Started on heparine drip, cardiology to decide about interventions.  * Anemia   Likely due to chronic renal failure, continue monitoring.   As on heparine drip and significant drop- check stool guiac.  * Urinary tract infection   IV ceftriaxone, follow urine culture.   All the records are reviewed and case discussed with Care Management/Social Workerr. Management plans discussed with the patient, family and they are in agreement.  CODE STATUS: DNR  TOTAL TIME TAKING CARE OF THIS PATIENT: 35 minutes.   POSSIBLE D/C IN 2-3 DAYS, DEPENDING ON CLINICAL CONDITION.   Altamese Dilling M.D on 12/20/2016   Between 7am to 6pm - Pager - [phone]  After 6pm go to [email or web address] - password [employer]  [facility]  Office  [phone]  CC: Primary care physician; Leanna Sato, MD  Note: This dictation was prepared with Dragon dictation along with smaller phrase technology. Any transcriptional errors that result from this process are unintentional.

## 2016-12-20 NOTE — Progress Notes (Signed)
Pharmacy Antibiotic Note  Timothy Hays is a 83 y.o. male admitted on 12/18/2016 with cellulitis and UTI.  Pharmacy has been consulted for ceftriaxone dosing.  Plan: Day 3- Ceftriaxone 1g IV Q24hr. Patient with allergy to cephalexin (nausea and vomiting); patient tolerating ceftriaxone without issue.   Height:  (167.6 cm) Weight: 214 lb 15.2 oz (97.5 kg) IBW/kg (Calculated) : 63.8  Temp (24hrs), Avg:98.4 F (36.9 C), Min:97.6 F (36.4 C), Max:99.1 F (37.3 C)   Recent Labs Lab 12/18/16 0548 12/18/16 1024 12/19/16 0326 12/20/16 0641  WBC 6.8 5.8 5.4 4.9  CREATININE 2.08* 2.12* 1.71* 1.70*  LATICACIDVEN 1.9 5.1*  --   --     Estimated Creatinine Clearance: 28.4 mL/min (A) (by C-G formula based on SCr of 1.7 mg/dL (H)).    Allergies  Allergen Reactions  . Fish Allergy Anaphylaxis  . Keflex [Cephalexin] Nausea And Vomiting    Daughter advises that his vomiting is extremely "voilent and severe".  . Morphine And Related Nausea And Vomiting  . Penicillins     Has patient had a PCN reaction causing immediate rash, facial/tongue/throat swelling,} If all of the above answers are "NO", then may proceed with Cephalosporin use. SOB or lightheadedness with hypotension: No Has patient had a PCN reaction causing severe rash involving mucus membranes or skin necrosis: Unknown Has patient had a PCN reaction that required hospitalization Unknown Has patient had a PCN reaction occurring within the last 10 years: No     Antimicrobials this admission: Ceftriaxone 4/12 >>   Dose adjustments this admission: N/A  Microbiology results: 4/12 UCx: multiple species 4/12 MRSA PCR: negative  4/13 Procalcitonin: 0.15  Pharmacy will continue to monitor and adjust per consult.    Timothy Hays A 12/20/2016 1:10 PM

## 2016-12-20 NOTE — Progress Notes (Signed)
ANTICOAGULATION CONSULT NOTE - Initial Consult  Pharmacy Consult for heparin  Indication: chest pain/ACS   Pharmacy consulted for heparin drip management for 83 yo male ICU patient admitted with acute CHF exacerbation and elevated troponins. Patient does not take anticoagulation as an outpatient. Patient currently receiving heparin drip at 1000 units/hr.   Goal of Therapy:  Heparin level 0.3-0.7 units/ml Monitor platelets by anticoagulation protocol: Yes   Plan:  Continue heparin at 1000 unit/hr. Patient has two consecutive anti-Xa levels in range, will obtain next level with am labs.   4/14  HL@ 0641= 0.34. Will continue current rate of 1000 units/hr. Will check next HL with am labs     Allergies  Allergen Reactions  . Fish Allergy Anaphylaxis  . Keflex [Cephalexin] Nausea And Vomiting    Daughter advises that his vomiting is extremely "voilent and severe".  . Morphine And Related Nausea And Vomiting  . Penicillins     Has patient had a PCN reaction causing immediate rash, facial/tongue/throat swelling,} If all of the above answers are "NO", then may proceed with Cephalosporin use. SOB or lightheadedness with hypotension: No Has patient had a PCN reaction causing severe rash involving mucus membranes or skin necrosis: Unknown Has patient had a PCN reaction that required hospitalization Unknown Has patient had a PCN reaction occurring within the last 10 years: No     Patient Measurements: Height:  (167.6 cm) Weight: 214 lb 15.2 oz (97.5 kg) IBW/kg (Calculated) : 63.8 Heparin Dosing Weight: 81kg   Vital Signs: Temp: 97.6 F (36.4 C) (04/14 0200) Temp Source: Axillary (04/14 0200) BP: 140/82 (04/14 0600) Pulse Rate: 95 (04/14 0600)  Labs:  Recent Labs  12/18/16 0548  12/18/16 1024 12/18/16 1608  12/18/16 2353 12/19/16 0326 12/19/16 0734 12/20/16 0641  HGB 8.4*  --  8.8*  --   --   --  8.1*  --  7.9*  HCT 27.0*  --  28.1*  --   --   --  25.7*  --  24.6*   PLT 235  --  227  --   --   --  218  --  218  APTT  --   --   --  111*  --   --   --   --   --   LABPROT  --   --   --  15.0  --   --   --   --   --   INR  --   --   --  1.17  --   --   --   --   --   HEPARINUNFRC  --   --   --   --   --  0.38  --  0.42 0.34  CREATININE 2.08*  --  2.12*  --   --   --  1.71*  --   --   TROPONINI 0.19*  < > 0.87* 1.16*  < > 1.20* 1.20* 1.22*  --   < > = values in this interval not displayed.  Estimated Creatinine Clearance: 28.3 mL/min (A) (by C-G formula based on SCr of 1.71 mg/dL (H)).   Medical History: Past Medical History:  Diagnosis Date  . Anginal pain (HCC)   . CAD (coronary artery disease)   . CKD (chronic kidney disease), stage III   . Diabetes mellitus without complication (HCC)   . Dyspnea   . GERD (gastroesophageal reflux disease)   . History of gout   . History  of MI (myocardial infarction)   . Hyperlipidemia   . Hypertension   . Myocardial infarction (HCC)   . Peripheral vascular disease (HCC)     Medications:  Scheduled:  . budesonide (PULMICORT) nebulizer solution  0.5 mg Nebulization BID  . cefTRIAXone (ROCEPHIN) IVPB 1 gram/50 mL D5W  1 g Intravenous Q24H  . furosemide  20 mg Intravenous Q12H  . gabapentin  200 mg Oral BID WC  . gabapentin  300 mg Oral QHS  . insulin aspart  0-9 Units Subcutaneous Q4H  . ipratropium-albuterol  3 mL Nebulization Q4H  . latanoprost  1 drop Right Eye QHS  . methylPREDNISolone (SOLU-MEDROL) injection  40 mg Intravenous Q12H  . pantoprazole  40 mg Oral Daily  . sodium chloride flush  3 mL Intravenous Q12H   Infusions:  . heparin 1,000 Units/hr (12/19/16 1258)    Pharmacy will continue to monitor and adjust per consult.    Majour Frei A 12/20/2016,7:26 AM

## 2016-12-20 NOTE — Progress Notes (Signed)
[facility] Care Medicine Consultation    ASSESSMENT/PLAN   Respiratory failure. Multifactorial to include congestive heart failure and bronchospasm. We'll continue noninvasive ventilation, diuretic therapy, albuterol, Atrovent, Solu-Medrol. Will continue to monitor patient in the intensive care unit  Renal failure. Creatinine has decreased 1.7  Hyponatremia. Stable at this time, was 136 now 134 will follow closely  Anemia mild decrease. No evidence of active bleeding  Hyperglycemia. Blood sugar 173  Positive troponins. Peak troponin 1.22, decreased 0.87. Pending echocardiogram   Name: Timothy Hays MRN: [id number] DOB: 02/19/1919    ADMISSION DATE:  12/18/2016  Subjective: Patient had initially improved yesterday was able to be weaned off of BiPAP and less evening became fatigued, increasing bronchospasm and increasing pulmonary edema. Required additional dose of steroids and diuretics along with breathing treatments and placed back on noninvasive ventilation  PAST MEDICAL HISTORY :  Past Medical History:  Diagnosis Date  . Anginal pain (HCC)   . CAD (coronary artery disease)   . CKD (chronic kidney disease), stage III   . Diabetes mellitus without complication (HCC)   . Dyspnea   . GERD (gastroesophageal reflux disease)   . History of gout   . History of MI (myocardial infarction)   . Hyperlipidemia   . Hypertension   . Myocardial infarction (HCC)   . Peripheral vascular disease (HCC)    History reviewed. No pertinent surgical history. Prior to Admission medications   Medication Sig Start Date End Date Taking? Authorizing Provider  acetaminophen (TYLENOL) 500 MG tablet Take 500 mg by mouth every 4 (four) hours as needed for mild pain.    Yes Historical Provider, MD  albuterol (PROVENTIL HFA;VENTOLIN HFA) 108 (90 Base) MCG/ACT inhaler Inhale 2 puffs into the lungs every 4 (four) hours as needed for wheezing or shortness of breath.   Yes Historical Provider, MD    azithromycin (ZITHROMAX) 250 MG tablet Take 250-500 mg by mouth daily. Take  on day one, then  for 4 days. 12/17/16 12/21/16 Yes Historical Provider, MD  gabapentin (NEURONTIN) 100 MG capsule Take 200-300 mg by mouth 3 (three) times daily. Take  at 0900 and 1200. Take  at bedtime.   Yes Historical Provider, MD  guaiFENesin-dextromethorphan (ROBITUSSIN DM) 100-10 MG/5ML syrup Take 10 mLs by mouth every 4 (four) hours as needed for cough.   Yes Historical Provider, MD  Infant Care Products (DERMACLOUD) CREA Apply 1 application topically 2 (two) times daily.   Yes Historical Provider, MD  latanoprost (XALATAN) 0.005 % ophthalmic solution Place 1 drop into the right eye at bedtime.   Yes Historical Provider, MD  metFORMIN (GLUCOPHAGE) 500 MG tablet Take 500 mg by mouth 2 (two) times daily.  06/10/16  Yes Historical Provider, MD  nitroGLYCERIN (NITROSTAT) 0.4 MG SL tablet Place 0.4 mg under the tongue every 5 (five) minutes as needed for chest pain.   Yes Historical Provider, MD  pantoprazole (PROTONIX) 40 MG tablet Take 40 mg by mouth daily.  06/10/16  Yes Historical Provider, MD  polyethylene glycol (MIRALAX / GLYCOLAX) packet Take 17 g by mouth daily as needed for mild constipation.   Yes Historical Provider, MD  Spacer/Aero Chamber Mouthpiece MISC 1 each by Does not apply route every 4 (four) hours as needed (with albuterol).   Yes Historical Provider, MD  sulfamethoxazole-trimethoprim (BACTRIM DS,SEPTRA DS) 800-160 MG tablet Take 1 tablet by mouth 2 (two) times daily. 12/11/16 12/20/16 Yes Historical Provider, MD   Allergies  Allergen Reactions  . Fish Allergy Anaphylaxis  .  Keflex [Cephalexin] Nausea And Vomiting    Daughter advises that his vomiting is extremely "voilent and severe".  . Morphine And Related Nausea And Vomiting  . Penicillins     Has patient had a PCN reaction causing immediate rash, facial/tongue/throat swelling,} If all of the above answers are "NO", then may  proceed with Cephalosporin use. SOB or lightheadedness with hypotension: No Has patient had a PCN reaction causing severe rash involving mucus membranes or skin necrosis: Unknown Has patient had a PCN reaction that required hospitalization Unknown Has patient had a PCN reaction occurring within the last 10 years: No     FAMILY HISTORY:  Family History  Problem Relation Age of Onset  . Hypertension Father   . Diabetes Neg Hx    SOCIAL HISTORY:  reports that he has never smoked. He has never used smokeless tobacco. He reports that he does not drink alcohol or use drugs.  VITAL SIGNS: Temp:  [97.6 F (36.4 C)-99.1 F (37.3 C)] 97.6 F (36.4 C) (04/14 0200) Pulse Rate:  [88-103] 95 (04/14 0600) Resp:  [13-26] 22 (04/14 0600) BP: (114-157)/(51-109) 140/82 (04/14 0600) SpO2:  [84 %-98 %] 98 % (04/14 0600) FiO2 (%):  [28 %] 28 % (04/14 0400) Weight:  [97.5 kg (214 lb 15.2 oz)] 97.5 kg (214 lb 15.2 oz) (04/14 0500) HEMODYNAMICS:   VENTILATOR SETTINGS: FiO2 (%):  [28 %] 28 % INTAKE / OUTPUT:  Intake/Output Summary (Last 24 hours) at 12/20/16 0837 Last data filed at 12/20/16 0600  Gross per 24 hour  Intake              290 ml  Output              725 ml  Net             -435 ml    Physical Examination:   VS: BP 140/82   Pulse 95   Temp 97.6 F (36.4 C) (Axillary)   Resp (!) 22   Ht  (1.676 m)   Wt 97.5 kg (214 lb 15.2 oz)   SpO2 98%   BMI 34.69 kg/m   General Appearance: On NIV Neuro:without focal findings, mental status, speech normal,. HEENT: PERRLA, EOM intact, no ptosis, no other lesions noticed;  Pulmonary: Basilar crackles, prolonged expiratory phase with diffuse expiratory wheezing CardiovascularNormal S1,S2.  No m/r/g.    Abdomen: Benign, Soft, non-tender, No masses, hepatosplenomegaly, No lymphadenopathy Renal:  No costovertebral tenderness  GU:  Not performed at this time. Endoc: No evident thyromegaly, no signs of acromegaly. Skin:   warm, no  rashes, no ecchymosis  Extremities: normal, no cyanosis, clubbing, no edema, warm with normal capillary refill.    LABS: Reviewed   LABORATORY PANEL:   CBC  Recent Labs Lab 12/20/16 0641  WBC 4.9  HGB 7.9*  HCT 24.6*  PLT 218    Chemistries   Recent Labs Lab 12/18/16 0548  12/20/16 0641  NA 134*  < > 134*  K 6.4*  < > 4.5  CL 101  < > 98*  CO2 25  < > 27  GLUCOSE 178*  < > 173*  BUN 36*  < > 47*  CREATININE 2.08*  < > 1.70*  CALCIUM 8.3*  < > 8.4*  AST 26  --   --   ALT 15*  --   --   ALKPHOS 67  --   --   BILITOT 0.4  --   --   < > = values  in this interval not displayed.   Recent Labs Lab 12/19/16 1122 12/19/16 1548 12/19/16 1959 12/20/16 0026 12/20/16 0348 12/20/16 0737  GLUCAP 161* 181* 146* 149* 171* 155*   No results for input(s): PHART, PCO2ART, PO2ART in the last 168 hours.  Recent Labs Lab 12/18/16 0548  AST 26  ALT 15*  ALKPHOS 67  BILITOT 0.4  ALBUMIN 3.2*    Cardiac Enzymes  Recent Labs Lab 12/20/16 0641  TROPONINI 0.87*    RADIOLOGY:  Dg Chest Port 1 View  Result Date: 12/20/2016 CLINICAL DATA:  Pulmonary edema.  Respiratory distress. EXAM: PORTABLE CHEST 1 VIEW COMPARISON:  One-view chest x-ray 12/19/2016. FINDINGS: The heart size is exaggerate by low lung volumes. There is a slight increase and bilateral perihilar edema. Bibasilar airspace disease likely reflects atelectasis left greater than right. No other significant consolidation is present. The visualized soft tissues and bony thorax are unremarkable. IMPRESSION: 1. The slight increase and bilateral perihilar edema. 2. Left greater than right airspace disease likely reflects atelectasis. Electronically Signed   By: Marin Roberts M.D.   On: 12/20/2016 07:48   Dg Chest Port 1 View  Result Date: 12/19/2016 CLINICAL DATA:  Acute congestive heart failure. EXAM: PORTABLE CHEST 1 VIEW COMPARISON:  Radiograph of December 18, 2016. FINDINGS: Stable cardiomediastinal  silhouette. No pneumothorax or pleural effusion is noted. Stable interstitial densities are noted throughout both lungs concerning for pulmonary edema. Bony thorax is unremarkable. Atherosclerosis of thoracic aorta is noted. IMPRESSION: Stable bilateral interstitial densities are noted consistent with pulmonary edema. Aortic atherosclerosis. Electronically Signed   By: Lupita Raider, M.D.   On: 12/19/2016 07:36       Tora Kindred, DO Board Certified in Internal Medicine, Pulmonary Medicine, Critical Care Medicine, and Sleep Medicine.  ICU Pager [phone] [facility] Office Number: [phone]  Santiago Glad, M.D.  Billy Fischer, M.D   12/20/2016, 8:37 AM

## 2016-12-20 NOTE — Plan of Care (Signed)
Problem: Cardiac: Goal: Ability to achieve and maintain adequate cardiopulmonary perfusion will improve Outcome: Not Progressing Pt unable to remain on nasal canula, had to be placed back on bipap between 0300 and 0400. Continues to have a cough and audible expiratory wheezing despite having nebulizer treatments, Lasix, solumedrol, and cough aids. Complained of chest pain x once during the night, but it was related to tightness from coughing and wheezing, not actual chest pain or pressure.  Has been SR with a BBB and PVCs. Plan was to be moved out to the floor today, but pt still requiritng bipap at this time. Continue to monitor.

## 2016-12-20 NOTE — Progress Notes (Signed)
Pt. talken off bipap and placed on 2 L nasal cannula with a Sa02 of 98%.

## 2016-12-21 ENCOUNTER — Inpatient Hospital Stay: Payer: Medicare Other

## 2016-12-21 LAB — BASIC METABOLIC PANEL
Anion gap: 8 (ref 5–15)
BUN: 50 mg/dL — ABNORMAL HIGH (ref 6–20)
CO2: 29 mmol/L (ref 22–32)
CREATININE: 1.51 mg/dL — AB (ref 0.61–1.24)
Calcium: 8.4 mg/dL — ABNORMAL LOW (ref 8.9–10.3)
Chloride: 98 mmol/L — ABNORMAL LOW (ref 101–111)
GFR calc Af Amer: 43 mL/min — ABNORMAL LOW (ref >60)
GFR calc non Af Amer: 37 mL/min — ABNORMAL LOW (ref >60)
GLUCOSE: 171 mg/dL — AB (ref 65–99)
Potassium: 4.8 mmol/L (ref 3.5–5.1)
Sodium: 135 mmol/L (ref 135–145)

## 2016-12-21 LAB — CBC
HEMATOCRIT: 25.3 % — AB (ref 40.0–52.0)
Hemoglobin: 8.2 g/dL — ABNORMAL LOW (ref 13.0–18.0)
MCH: 25.5 pg — ABNORMAL LOW (ref 26.0–34.0)
MCHC: 32.3 g/dL (ref 32.0–36.0)
MCV: 79 fL — AB (ref 80.0–100.0)
Platelets: 214 10*3/uL (ref 150–440)
RBC: 3.2 MIL/uL — ABNORMAL LOW (ref 4.40–5.90)
RDW: 17.1 % — AB (ref 11.5–14.5)
WBC: 4 10*3/uL (ref 3.8–10.6)

## 2016-12-21 LAB — GLUCOSE, CAPILLARY
GLUCOSE-CAPILLARY: 168 mg/dL — AB (ref 65–99)
GLUCOSE-CAPILLARY: 205 mg/dL — AB (ref 65–99)
Glucose-Capillary: 138 mg/dL — ABNORMAL HIGH (ref 65–99)
Glucose-Capillary: 170 mg/dL — ABNORMAL HIGH (ref 65–99)
Glucose-Capillary: 172 mg/dL — ABNORMAL HIGH (ref 65–99)
Glucose-Capillary: 173 mg/dL — ABNORMAL HIGH (ref 65–99)
Glucose-Capillary: 198 mg/dL — ABNORMAL HIGH (ref 65–99)

## 2016-12-21 LAB — HEPARIN LEVEL (UNFRACTIONATED)
HEPARIN UNFRACTIONATED: 0.45 [IU]/mL (ref 0.30–0.70)
Heparin Unfractionated: 0.27 IU/mL — ABNORMAL LOW (ref 0.30–0.70)
Heparin Unfractionated: 0.42 IU/mL (ref 0.30–0.70)

## 2016-12-21 MED ORDER — ORAL CARE MOUTH RINSE
15.0000 mL | Freq: Two times a day (BID) | OROMUCOSAL | Status: DC
  Administered 2016-12-21 – 2016-12-23 (×4): 15 mL via OROMUCOSAL

## 2016-12-21 MED ORDER — HEPARIN BOLUS VIA INFUSION
1500.0000 [IU] | Freq: Once | INTRAVENOUS | Status: AC
  Administered 2016-12-21: 1500 [IU] via INTRAVENOUS
  Filled 2016-12-21: qty 1500

## 2016-12-21 NOTE — Progress Notes (Signed)
Patient transferred to 2A per order, report called to Viviann Spare, pt transported on floor bed with 2 liters portable O2, no belongings in room, CCMD called, IV poles changed out in 2A room.  VSS, pt agreeable to transfer, daughter updated and informed of new room number by Clinical research associate

## 2016-12-21 NOTE — Progress Notes (Signed)
[facility] Critical Care Medicine Consultation    ASSESSMENT/PLAN   Respiratory failure. Multifactorial to include congestive heart failure and bronchospasm. We'll continue diuretic therapy, Pulmicort, albuterol, Atrovent, Solu-Medrol. Patient looks good this morning if stable throughout the morning we'll transfer to the floor  Renal failure. Creatinine has decreased 1.51  Hyponatremia. Stable at this time  Anemia No evidence of active bleeding  Hyperglycemia. Blood sugar 171  Positive troponins. Peak troponin 1.22, decreased 0.87. Pending echocardiogram   Name: Timothy Hays MRN: [id number] DOB: 03/18/1919    ADMISSION DATE:  12/18/2016  Subjective: Patient had initially improved yesterday was able to be weaned off of BiPAP and less evening became fatigued, increasing bronchospasm and increasing pulmonary edema. Required additional dose of steroids and diuretics along with breathing treatments and placed back on noninvasive ventilation  PAST MEDICAL HISTORY :  Past Medical History:  Diagnosis Date  . Anginal pain (HCC)   . CAD (coronary artery disease)   . CKD (chronic kidney disease), stage III   . Diabetes mellitus without complication (HCC)   . Dyspnea   . GERD (gastroesophageal reflux disease)   . History of gout   . History of MI (myocardial infarction)   . Hyperlipidemia   . Hypertension   . Myocardial infarction (HCC)   . Peripheral vascular disease (HCC)    History reviewed. No pertinent surgical history. Prior to Admission medications   Medication Sig Start Date End Date Taking? Authorizing Provider  acetaminophen (TYLENOL) 500 MG tablet Take 500 mg by mouth every 4 (four) hours as needed for mild pain.    Yes Historical Provider, MD  albuterol (PROVENTIL HFA;VENTOLIN HFA) 108 (90 Base) MCG/ACT inhaler Inhale 2 puffs into the lungs every 4 (four) hours as needed for wheezing or shortness of breath.   Yes Historical Provider, MD  azithromycin (ZITHROMAX) 250  MG tablet Take 250-500 mg by mouth daily. Take  on day one, then  for 4 days. 12/17/16 12/21/16 Yes Historical Provider, MD  gabapentin (NEURONTIN) 100 MG capsule Take 200-300 mg by mouth 3 (three) times daily. Take  at 0900 and 1200. Take  at bedtime.   Yes Historical Provider, MD  guaiFENesin-dextromethorphan (ROBITUSSIN DM) 100-10 MG/5ML syrup Take 10 mLs by mouth every 4 (four) hours as needed for cough.   Yes Historical Provider, MD  Infant Care Products (DERMACLOUD) CREA Apply 1 application topically 2 (two) times daily.   Yes Historical Provider, MD  latanoprost (XALATAN) 0.005 % ophthalmic solution Place 1 drop into the right eye at bedtime.   Yes Historical Provider, MD  metFORMIN (GLUCOPHAGE) 500 MG tablet Take 500 mg by mouth 2 (two) times daily.  06/10/16  Yes Historical Provider, MD  nitroGLYCERIN (NITROSTAT) 0.4 MG SL tablet Place 0.4 mg under the tongue every 5 (five) minutes as needed for chest pain.   Yes Historical Provider, MD  pantoprazole (PROTONIX) 40 MG tablet Take 40 mg by mouth daily.  06/10/16  Yes Historical Provider, MD  polyethylene glycol (MIRALAX / GLYCOLAX) packet Take 17 g by mouth daily as needed for mild constipation.   Yes Historical Provider, MD  Spacer/Aero Chamber Mouthpiece MISC 1 each by Does not apply route every 4 (four) hours as needed (with albuterol).   Yes Historical Provider, MD  sulfamethoxazole-trimethoprim (BACTRIM DS,SEPTRA DS) 800-160 MG tablet Take 1 tablet by mouth 2 (two) times daily. 12/11/16 12/20/16 Yes Historical Provider, MD   Allergies  Allergen Reactions  . Fish Allergy Anaphylaxis  . Keflex [Cephalexin] Nausea And Vomiting  Daughter advises that his vomiting is extremely "voilent and severe".  . Morphine And Related Nausea And Vomiting  . Penicillins     Has patient had a PCN reaction causing immediate rash, facial/tongue/throat swelling,} If all of the above answers are "NO", then may proceed with Cephalosporin use.  SOB or lightheadedness with hypotension: No Has patient had a PCN reaction causing severe rash involving mucus membranes or skin necrosis: Unknown Has patient had a PCN reaction that required hospitalization Unknown Has patient had a PCN reaction occurring within the last 10 years: No     FAMILY HISTORY:  Family History  Problem Relation Age of Onset  . Hypertension Father   . Diabetes Neg Hx    SOCIAL HISTORY:  reports that he has never smoked. He has never used smokeless tobacco. He reports that he does not drink alcohol or use drugs.  VITAL SIGNS: Temp:  [97.9 F (36.6 C)-98.3 F (36.8 C)] 98 F (36.7 C) (04/15 0800) Pulse Rate:  [76-108] 82 (04/15 0700) Resp:  [14-26] 16 (04/15 0700) BP: (125-152)/(68-112) 147/75 (04/15 0700) SpO2:  [89 %-97 %] 96 % (04/15 0700) FiO2 (%):  [28 %] 28 % (04/14 2328) Weight:  [91.6 kg (201 lb 15.1 oz)] 91.6 kg (201 lb 15.1 oz) (04/15 0500) HEMODYNAMICS:   VENTILATOR SETTINGS: FiO2 (%):  [28 %] 28 % INTAKE / OUTPUT:  Intake/Output Summary (Last 24 hours) at 12/21/16 0823 Last data filed at 12/21/16 0800  Gross per 24 hour  Intake           281.65 ml  Output                0 ml  Net           281.65 ml    Physical Examination:   VS: BP (!) 147/75   Pulse 82   Temp 98 F (36.7 C) (Axillary)   Resp 16   Ht  (1.676 m)   Wt 91.6 kg (201 lb 15.1 oz)   SpO2 96%   BMI 32.59 kg/m   General Appearance: On NIV Neuro:without focal findings, mental status, speech normal,. HEENT: PERRLA, EOM intact, no ptosis, no other lesions noticed;  Pulmonary: Basilar crackles Significantly improved, prolonged expiratory phase with diffuse expiratory wheezing noted CardiovascularNormal S1,S2.  No m/r/g.    Abdomen: Benign, Soft, non-tender, No masses, hepatosplenomegaly, No lymphadenopathy Renal:  No costovertebral tenderness  GU:  Not performed at this time. Endoc: No evident thyromegaly, no signs of acromegaly. Skin:   warm, no rashes, no  ecchymosis  Extremities: normal, no cyanosis, clubbing, no edema, warm with normal capillary refill.    LABS: Reviewed   LABORATORY PANEL:   CBC  Recent Labs Lab 12/21/16 0453  WBC 4.0  HGB 8.2*  HCT 25.3*  PLT 214    Chemistries   Recent Labs Lab 12/18/16 0548  12/21/16 0453  NA 134*  < > 135  K 6.4*  < > 4.8  CL 101  < > 98*  CO2 25  < > 29  GLUCOSE 178*  < > 171*  BUN 36*  < > 50*  CREATININE 2.08*  < > 1.51*  CALCIUM 8.3*  < > 8.4*  AST 26  --   --   ALT 15*  --   --   ALKPHOS 67  --   --   BILITOT 0.4  --   --   < > = values in this interval not displayed.   Recent  Labs Lab 12/20/16 1150 12/20/16 1556 12/20/16 1949 12/21/16 0018 12/21/16 0413 12/21/16 0730  GLUCAP 144* 153* 165* 138* 173* 170*   No results for input(s): PHART, PCO2ART, PO2ART in the last 168 hours.  Recent Labs Lab 12/18/16 0548  AST 26  ALT 15*  ALKPHOS 67  BILITOT 0.4  ALBUMIN 3.2*    Cardiac Enzymes  Recent Labs Lab 12/20/16 0641  TROPONINI 0.87*    RADIOLOGY:  Dg Chest Port 1 View  Result Date: 12/21/2016 CLINICAL DATA:  Pulmonary edema EXAM: PORTABLE CHEST 1 VIEW COMPARISON:  12/20/2016 in FINDINGS: Normal cardiac silhouette. Mild improvement in interstitial edema pattern. Chronic interstitial lung disease pattern remains. No pneumothorax. No focal consolidation. IMPRESSION: 1. Improvement in interstitial edema pattern. 2. Chronic interstitial lung pattern again noted. Electronically Signed   By: Genevive Bi M.D.   On: 12/21/2016 08:00   Dg Chest Port 1 View  Result Date: 12/20/2016 CLINICAL DATA:  Pulmonary edema.  Respiratory distress. EXAM: PORTABLE CHEST 1 VIEW COMPARISON:  One-view chest x-ray 12/19/2016. FINDINGS: The heart size is exaggerate by low lung volumes. There is a slight increase and bilateral perihilar edema. Bibasilar airspace disease likely reflects atelectasis left greater than right. No other significant consolidation is present. The  visualized soft tissues and bony thorax are unremarkable. IMPRESSION: 1. The slight increase and bilateral perihilar edema. 2. Left greater than right airspace disease likely reflects atelectasis. Electronically Signed   By: Marin Roberts M.D.   On: 12/20/2016 07:48       Tora Kindred, DO Board Certified in Internal Medicine, Pulmonary Medicine, Critical Care Medicine, and Sleep Medicine.  ICU Pager [phone] [place] [facility] Office Number: [phone]  Santiago Glad, M.D.  Billy Fischer, M.D   12/21/2016, 8:23 AM

## 2016-12-21 NOTE — Progress Notes (Signed)
Pt has been asleep with bipap until after three Am and wanted it off. Back on 2L and tolerating with sats in the mid 90s but still wheezing. Continue on nebs, lasix, Solumedrol and prn cough meds.

## 2016-12-21 NOTE — Progress Notes (Signed)
Per pt's RN- Pt removed Bipap mask and is refusing to place mask for sleep. Pt on 2L [place]

## 2016-12-21 NOTE — Progress Notes (Signed)
ANTICOAGULATION CONSULT NOTE - Follow Up Consult  Pharmacy Consult for Heparin Drip Indication: chest pain/ACS       Allergies  Allergen Reactions  . Fish Allergy Anaphylaxis  . Keflex [Cephalexin] Nausea And Vomiting    Daughter advises that his vomiting is extremely "voilent and severe".  . Morphine And Related Nausea And Vomiting  . Penicillins     Has patient had a PCN reaction causing immediate rash, facial/tongue/throat swelling,} If all of the above answers are "NO", then may proceed with Cephalosporin use. SOB or lightheadedness with hypotension: No Has patient had a PCN reaction causing severe rash involving mucus membranes or skin necrosis: Unknown Has patient had a PCN reaction that required hospitalization Unknown Has patient had a PCN reaction occurring within the last 10 years: No     Patient Measurements: Height:  (167.6 cm) Weight: 201 lb 15.1 oz (91.6 kg) IBW/kg (Calculated) : 63.8 Heparin Dosing Weight: 83.3 kg  Vital Signs: Temp: 97.5 F (36.4 C) (04/15 1153) Temp Source: Oral (04/15 1153) BP: 134/69 (04/15 1153) Pulse Rate: 87 (04/15 1153)  Labs:  Recent Labs (last 2 labs)    Recent Labs  12/18/16 1608  12/19/16 0326 12/19/16 0734 12/20/16 0641 12/21/16 0453 12/21/16 1321  HGB  --   < > 8.1*  --  7.9* 8.2*  --   HCT  --   --  25.7*  --  24.6* 25.3*  --   PLT  --   --  218  --  218 214  --   APTT 111*  --   --   --   --   --   --   LABPROT 15.0  --   --   --   --   --   --   INR 1.17  --   --   --   --   --   --   HEPARINUNFRC  --   < >  --  0.42 0.34 0.27* 0.42  CREATININE  --   --  1.71*  --  1.70* 1.51*  --   TROPONINI 1.16*  < > 1.20* 1.22* 0.87*  --   --   < > = values in this interval not displayed.    Estimated Creatinine Clearance: 31 mL/min (A) (by C-G formula based on SCr of 1.51 mg/dL (H)).  Assessment: Pharmacy consulted for heparin drip management for 83 yo male ICU patient admitted with acute CHF  exacerbation and elevated troponins. Patient does not take anticoagulation as an outpatient. Patient currently receiving heparin drip at 1000 units/hr.   Goal of Therapy: Heparin level 0.3-0.7 units/ml Monitor platelets by anticoagulation protocol: Yes  Plan: Continue heparin at 1000 unit/hr. Patient has two consecutive anti-Xa levels in range, will obtain next level with am labs.   4/14 HL@ 0641= 0.34. Will continue current rate of 1000 units/hr. Will check next HL with am labs  4/15 @ 0500 HL 0.27 subtherapeutic. Will rebolus w/ 1500 units IV x 1 and will increase rate to 1150 units/hr and will recheck HL @ 1300.  4/15 HL@ 1321= 0.42. Will continue with current rate and check confirmatory level in 8 hours.   4/15 HL 2100 0.45 therapeutic. Will continue current rate and recheck w/ am labs.  Thank you for this consult.  Thomasene Ripple, PharmD, BCPS Clinical Pharmacist 12/21/2016

## 2016-12-21 NOTE — Progress Notes (Signed)
ANTICOAGULATION CONSULT NOTE - Follow Up Consult  Pharmacy Consult for Heparin Drip Indication: chest pain/ACS  Allergies  Allergen Reactions  . Fish Allergy Anaphylaxis  . Keflex [Cephalexin] Nausea And Vomiting    Daughter advises that his vomiting is extremely "voilent and severe".  . Morphine And Related Nausea And Vomiting  . Penicillins     Has patient had a PCN reaction causing immediate rash, facial/tongue/throat swelling,} If all of the above answers are "NO", then may proceed with Cephalosporin use. SOB or lightheadedness with hypotension: No Has patient had a PCN reaction causing severe rash involving mucus membranes or skin necrosis: Unknown Has patient had a PCN reaction that required hospitalization Unknown Has patient had a PCN reaction occurring within the last 10 years: No     Patient Measurements: Height:  (167.6 cm) Weight: 201 lb 15.1 oz (91.6 kg) IBW/kg (Calculated) : 63.8 Heparin Dosing Weight: 83.3 kg  Vital Signs: Temp: 97.5 F (36.4 C) (04/15 1153) Temp Source: Oral (04/15 1153) BP: 134/69 (04/15 1153) Pulse Rate: 87 (04/15 1153)  Labs:  Recent Labs  12/18/16 1608  12/19/16 0326 12/19/16 0734 12/20/16 0641 12/21/16 0453 12/21/16 1321  HGB  --   < > 8.1*  --  7.9* 8.2*  --   HCT  --   --  25.7*  --  24.6* 25.3*  --   PLT  --   --  218  --  218 214  --   APTT 111*  --   --   --   --   --   --   LABPROT 15.0  --   --   --   --   --   --   INR 1.17  --   --   --   --   --   --   HEPARINUNFRC  --   < >  --  0.42 0.34 0.27* 0.42  CREATININE  --   --  1.71*  --  1.70* 1.51*  --   TROPONINI 1.16*  < > 1.20* 1.22* 0.87*  --   --   < > = values in this interval not displayed.  Estimated Creatinine Clearance: 31 mL/min (A) (by C-G formula based on SCr of 1.51 mg/dL (H)).  Assessment: Pharmacy consulted for heparin drip management for 83 yo male ICU patient admitted with acute CHF exacerbation and elevated troponins. Patient does not take  anticoagulation as an outpatient. Patient currently receiving heparin drip at 1000 units/hr.   Goal of Therapy: Heparin level 0.3-0.7 units/ml Monitor platelets by anticoagulation protocol: Yes  Plan: Continue heparin at 1000 unit/hr. Patient has two consecutive anti-Xa levels in range, will obtain next level with am labs.   4/14 HL@ 0641= 0.34. Will continue current rate of 1000 units/hr. Will check next HL with am labs  4/15 @ 0500 HL 0.27 subtherapeutic. Will rebolus w/ 1500 units IV x 1 and will increase rate to 1150 units/hr and will recheck HL @ 1300.  4/15 HL@ 1321= 0.42. Will continue with current rate and check confirmatory level in 8 hours.   Letcher Schweikert A 12/21/2016,2:07 PM

## 2016-12-21 NOTE — Progress Notes (Signed)
ANTICOAGULATION CONSULT NOTE - Initial Consult  Pharmacy Consult for heparin  Indication: chest pain/ACS   Pharmacy consulted for heparin drip management for 83 yo male ICU patient admitted with acute CHF exacerbation and elevated troponins. Patient does not take anticoagulation as an outpatient. Patient currently receiving heparin drip at 1000 units/hr.   Goal of Therapy:  Heparin level 0.3-0.7 units/ml Monitor platelets by anticoagulation protocol: Yes   Plan:  Continue heparin at 1000 unit/hr. Patient has two consecutive anti-Xa levels in range, will obtain next level with am labs.   4/14  HL@ 0641= 0.34. Will continue current rate of 1000 units/hr. Will check next HL with am labs  4/15 @ 0500 HL 0.27 subtherapeutic. Will rebolus w/ 1500 units IV x 1 and will increase rate to 1150 units/hr and will recheck HL @ 1300.        Allergies  Allergen Reactions  . Fish Allergy Anaphylaxis  . Keflex [Cephalexin] Nausea And Vomiting    Daughter advises that his vomiting is extremely "voilent and severe".  . Morphine And Related Nausea And Vomiting  . Penicillins     Has patient had a PCN reaction causing immediate rash, facial/tongue/throat swelling,} If all of the above answers are "NO", then may proceed with Cephalosporin use. SOB or lightheadedness with hypotension: No Has patient had a PCN reaction causing severe rash involving mucus membranes or skin necrosis: Unknown Has patient had a PCN reaction that required hospitalization Unknown Has patient had a PCN reaction occurring within the last 10 years: No     Patient Measurements: Height:  (167.6 cm) Weight: 214 lb 15.2 oz (97.5 kg) IBW/kg (Calculated) : 63.8 Heparin Dosing Weight: 81kg   Vital Signs: Temp: 97.6 F (36.4 C) (04/14 0200) Temp Source: Axillary (04/14 0200) BP: 140/82 (04/14 0600) Pulse Rate: 95 (04/14 0600)  Labs:  Recent Labs (last 2 labs)    Recent Labs  12/18/16 0548   12/18/16 1024 12/18/16 1608  12/18/16 2353 12/19/16 0326 12/19/16 0734 12/20/16 0641  HGB 8.4*  --  8.8*  --   --   --  8.1*  --  7.9*  HCT 27.0*  --  28.1*  --   --   --  25.7*  --  24.6*  PLT 235  --  227  --   --   --  218  --  218  APTT  --   --   --  111*  --   --   --   --   --   LABPROT  --   --   --  15.0  --   --   --   --   --   INR  --   --   --  1.17  --   --   --   --   --   HEPARINUNFRC  --   --   --   --   --  0.38  --  0.42 0.34  CREATININE 2.08*  --  2.12*  --   --   --  1.71*  --   --   TROPONINI 0.19*  < > 0.87* 1.16*  < > 1.20* 1.20* 1.22*  --   < > = values in this interval not displayed.    Estimated Creatinine Clearance: 28.3 mL/min (A) (by C-G formula based on SCr of 1.71 mg/dL (H)).   Medical History:     Past Medical History:  Diagnosis Date  . Anginal pain (HCC)   .  CAD (coronary artery disease)   . CKD (chronic kidney disease), stage III   . Diabetes mellitus without complication (HCC)   . Dyspnea   . GERD (gastroesophageal reflux disease)   . History of gout   . History of MI (myocardial infarction)   . Hyperlipidemia   . Hypertension   . Myocardial infarction (HCC)   . Peripheral vascular disease (HCC)     Medications:  Scheduled:  . budesonide (PULMICORT) nebulizer solution  0.5 mg Nebulization BID  . cefTRIAXone (ROCEPHIN) IVPB 1 gram/50 mL D5W  1 g Intravenous Q24H  . furosemide  20 mg Intravenous Q12H  . gabapentin  200 mg Oral BID WC  . gabapentin  300 mg Oral QHS  . insulin aspart  0-9 Units Subcutaneous Q4H  . ipratropium-albuterol  3 mL Nebulization Q4H  . latanoprost  1 drop Right Eye QHS  . methylPREDNISolone (SOLU-MEDROL) injection  40 mg Intravenous Q12H  . pantoprazole  40 mg Oral Daily  . sodium chloride flush  3 mL Intravenous Q12H   Infusions:  . heparin 1,000 Units/hr (12/19/16 1258)    Pharmacy will continue to monitor and adjust per consult.   Thomasene Ripple, PharmD, BCPS Clinical  Pharmacist 12/21/2016

## 2016-12-21 NOTE — Plan of Care (Signed)
Problem: Education: Goal: Knowledge of [facility] Education information/materials will improve Outcome: Not Progressing Patient currently too fatigued. Jari Favre [facility]

## 2016-12-22 ENCOUNTER — Inpatient Hospital Stay: Payer: Medicare Other

## 2016-12-22 LAB — BASIC METABOLIC PANEL
Anion gap: 7 (ref 5–15)
BUN: 48 mg/dL — ABNORMAL HIGH (ref 6–20)
CHLORIDE: 97 mmol/L — AB (ref 101–111)
CO2: 30 mmol/L (ref 22–32)
Calcium: 8.6 mg/dL — ABNORMAL LOW (ref 8.9–10.3)
Creatinine, Ser: 1.33 mg/dL — ABNORMAL HIGH (ref 0.61–1.24)
GFR, EST AFRICAN AMERICAN: 51 mL/min — AB (ref >60)
GFR, EST NON AFRICAN AMERICAN: 44 mL/min — AB (ref >60)
Glucose, Bld: 149 mg/dL — ABNORMAL HIGH (ref 65–99)
POTASSIUM: 5 mmol/L (ref 3.5–5.1)
SODIUM: 134 mmol/L — AB (ref 135–145)

## 2016-12-22 LAB — GLUCOSE, CAPILLARY
GLUCOSE-CAPILLARY: 150 mg/dL — AB (ref 65–99)
GLUCOSE-CAPILLARY: 238 mg/dL — AB (ref 65–99)
Glucose-Capillary: 166 mg/dL — ABNORMAL HIGH (ref 65–99)
Glucose-Capillary: 223 mg/dL — ABNORMAL HIGH (ref 65–99)
Glucose-Capillary: 227 mg/dL — ABNORMAL HIGH (ref 65–99)

## 2016-12-22 LAB — CBC
HCT: 26 % — ABNORMAL LOW (ref 40.0–52.0)
HEMOGLOBIN: 8.3 g/dL — AB (ref 13.0–18.0)
MCH: 25.3 pg — AB (ref 26.0–34.0)
MCHC: 32 g/dL (ref 32.0–36.0)
MCV: 79.1 fL — AB (ref 80.0–100.0)
Platelets: 217 10*3/uL (ref 150–440)
RBC: 3.29 MIL/uL — ABNORMAL LOW (ref 4.40–5.90)
RDW: 17.1 % — ABNORMAL HIGH (ref 11.5–14.5)
WBC: 4.2 10*3/uL (ref 3.8–10.6)

## 2016-12-22 LAB — HEPARIN LEVEL (UNFRACTIONATED): Heparin Unfractionated: 0.4 IU/mL (ref 0.30–0.70)

## 2016-12-22 MED ORDER — INSULIN ASPART 100 UNIT/ML ~~LOC~~ SOLN
0.0000 [IU] | Freq: Three times a day (TID) | SUBCUTANEOUS | Status: DC
  Administered 2016-12-22 (×2): 3 [IU] via SUBCUTANEOUS
  Administered 2016-12-22: 1 [IU] via SUBCUTANEOUS
  Administered 2016-12-22 – 2016-12-23 (×3): 3 [IU] via SUBCUTANEOUS
  Filled 2016-12-22: qty 3
  Filled 2016-12-22: qty 1
  Filled 2016-12-22 (×3): qty 3

## 2016-12-22 NOTE — Evaluation (Signed)
Physical Therapy Evaluation Patient Details Name: Timothy Hays MRN: [id number] DOB: [birth year] Today's Date: 12/22/2016   History of Present Illness  Pt is a 83 y/o M who presents with hypoxia with O2 sats at 80% on arrival to the ED.  Pt also presents with some confusion.  Chest xray revealed pulmonary edema. Pt admitted with CHF exacerbation.  Pt's PMH includes MI, gout, CKD, CAD, hip replacement.    Clinical Impression  Pt admitted with above diagnosis. Pt currently with functional limitations due to the deficits listed below (see PT Problem List). Timothy Hays is from [facility] where his daughter reports all of his needs are met.  He has assist with bathing, lower body dressing and a Clarise Cruz lift is utilized to assist pt with bed to [facility] or lift chair transfers.  He is ind with feeding.  Per daughter, pt was just about to begin PT at [facility] before coming to the hospital.  Pt requires mod assist for bed mobility and min assist to maintain balance sitting EOB before fatiguing after ~2 minutes.   Recommending that pt return to [facility] with prior amount of assistance.  Pt will benefit from skilled PT to increase their independence and safety with mobility, specifically sitting balance and tolerance, to allow discharge to the venue listed below.      Follow Up Recommendations Other (comment) (Return to [facility] )    Equipment Recommendations  None recommended by PT    Recommendations for Other Services       Precautions / Restrictions Precautions Precautions: Fall;Other (comment) Precaution Comments: pt nonambulatory Restrictions Weight Bearing Restrictions: No      Mobility  Bed Mobility Overal bed mobility: Needs Assistance Bed Mobility: Supine to Sit;Sit to Supine     Supine to sit: Mod assist;HOB elevated Sit to supine: Mod assist   General bed mobility comments: Pt requires cues for sequencing and mod assist to elevate trunk as well as assist to advance LEs to EOB.  Pt uses  bed rail with HOB elevated.  To return to supine assist was provided to guide trunk and to bring LEs into bed.  Transfers                 General transfer comment: Not safe to attempt at this time  Ambulation/Gait                Stairs            Wheelchair Mobility    Modified Rankin (Stroke Patients Only)       Balance Overall balance assessment: Needs assistance;History of Falls Sitting-balance support: Feet supported;Bilateral upper extremity supported Sitting balance-Leahy Scale: Poor Sitting balance - Comments: Pt requires min assist with BUEs supported while sitting EOB due to posterior bias                                     Pertinent Vitals/Pain Pain Assessment: Faces Faces Pain Scale: Hurts a little bit Pain Location: L shoulder with flexion AROM Pain Descriptors / Indicators: Discomfort Pain Intervention(s): Monitored during session;Limited activity within patient's tolerance    Home Living Family/patient expects to be discharged to:: Assisted living               Home Equipment: Hospital bed;Other (comment) (lift chair) Additional Comments: Pt living at [facility]    Prior Function Level of Independence: Needs assistance   Gait / Transfers Assistance  Needed: Per daughter pt has been nonambulatory for the past year.  Pt lived at home with daughter as caregiver since 2013 and until 3 weeks ago when he went to [facility]  [facility] to [facility] daughter was using hoyer lift at home.  At [facility] the staff is using a Clarise Cruz to transfer pt from bed to [facility] or lift chair.    ADL's / Homemaking Assistance Needed: Pt is ind with feeding.  He requires assist for lower body dressing and bathing.  He spends most of his day in his lift chair.  Daughter reports all of his needs are being met at [facility] as far as mobility goes and plan is to return to [facility] at d/c.        Hand Dominance   Dominant Hand: Right     Extremity/Trunk Assessment   Upper Extremity Assessment Upper Extremity Assessment: LUE deficits/detail;RUE deficits/detail RUE Deficits / Details: RUE strength grossly 3+/5 LUE Deficits / Details: L shoulder Flexion limited to ~70 deg due to h/o shoulder injury several years ago.      Lower Extremity Assessment Lower Extremity Assessment: Generalized weakness (Strength grossly 2+/5 BLEs)    Cervical / Trunk Assessment Cervical / Trunk Assessment: Kyphotic  Communication   Communication: HOH  Cognition Arousal/Alertness: Awake/alert Behavior During Therapy: WFL for tasks assessed/performed Overall Cognitive Status: Within Functional Limits for tasks assessed                                        General Comments General comments (skin integrity, edema, etc.): Daughter present during Evaluation. SpO2 remains at or above 91% on 2L O2 throughout session.    Exercises General Exercises - Upper Extremity Shoulder Flexion: AROM;Both;10 reps;Supine General Exercises - Lower Extremity Ankle Circles/Pumps: AROM;Both;10 reps;Supine Heel Slides: AAROM;Both;10 reps;Supine Hip ABduction/ADduction: AAROM;Both;10 reps;Supine Straight Leg Raises: AAROM;Both;10 reps;Supine Other Exercises Other Exercises: Pt sat EOB for ~2 minutes with min assist due to posterior bias.  BUEs supported on bed and pt limited by fatigue.   Assessment/Plan    PT Assessment Patient needs continued PT services  PT Problem List Decreased strength;Decreased range of motion;Decreased activity tolerance;Decreased balance;Decreased mobility;Decreased knowledge of use of DME;Decreased safety awareness;Cardiopulmonary status limiting activity;Pain       PT Treatment Interventions DME instruction;Functional mobility training;Therapeutic activities;Therapeutic exercise;Balance training;Neuromuscular re-education;Patient/family education;Wheelchair mobility training    PT Goals (Current goals can be  found in the Care Plan section)  Acute Rehab PT Goals Patient Stated Goal: to return to [facility] PT Goal Formulation: With patient/family Time For Goal Achievement: 01/05/17 Potential to Achieve Goals: Fair    Frequency Min 2X/week   Barriers to discharge        Co-evaluation               End of Session Equipment Utilized During Treatment: Oxygen Activity Tolerance: Patient limited by fatigue Patient left: in bed;with call bell/phone within reach;with bed alarm set;with family/visitor present Nurse Communication: Mobility status;Need for lift equipment PT Visit Diagnosis: Muscle weakness (generalized) (M62.81);Difficulty in walking, not elsewhere classified (R26.2)    Time: 4825-0037 PT Time Calculation (min) (ACUTE ONLY): 20 min   Charges:   PT Evaluation $PT Eval Low Complexity: 1 Procedure     PT G CodesCollie Siad PT, DPT 12/22/2016, 3:04 PM

## 2016-12-22 NOTE — Progress Notes (Signed)
[facility] [place] [facility]   PATIENT NAME: Timothy Hays    MR#:  [id number]  DATE OF BIRTH:  Sep 10, 1918  SUBJECTIVE:  CHIEF COMPLAINT:   Chief Complaint  Patient presents with  . Respiratory Distress     Came with hypoxia and respi distress.   Failed weaning bipap yesterday- and had to restart on that.   Comfortable on nasal canula oxygen today.  REVIEW OF SYSTEMS:  CONSTITUTIONAL: No fever, fatigue or weakness.  EYES: No blurred or double vision.  EARS, NOSE, AND THROAT: No tinnitus or ear pain.  RESPIRATORY: positive for cough, shortness of breath, no wheezing or hemoptysis.  CARDIOVASCULAR: No chest pain, orthopnea, edema.  GASTROINTESTINAL: No nausea, vomiting, diarrhea or abdominal pain.  GENITOURINARY: No dysuria, hematuria.  ENDOCRINE: No polyuria, nocturia,  HEMATOLOGY: No anemia, easy bruising or bleeding SKIN: No rash or lesion. MUSCULOSKELETAL: No joint pain or arthritis.   NEUROLOGIC: No tingling, numbness, weakness.  PSYCHIATRY: No anxiety or depression.   ROS  DRUG ALLERGIES:   Allergies  Allergen Reactions  . Fish Allergy Anaphylaxis  . Keflex [Cephalexin] Nausea And Vomiting    Daughter advises that his vomiting is extremely "voilent and severe".  . Morphine And Related Nausea And Vomiting  . Penicillins     Has patient had a PCN reaction causing immediate rash, facial/tongue/throat swelling,} If all of the above answers are "NO", then may proceed with Cephalosporin use. SOB or lightheadedness with hypotension: No Has patient had a PCN reaction causing severe rash involving mucus membranes or skin necrosis: Unknown Has patient had a PCN reaction that required hospitalization Unknown Has patient had a PCN reaction occurring within the last 10 years: No     VITALS:  Blood pressure 125/69, pulse 86, temperature 97.6 F (36.4 C), resp. rate 18, height  (1.676 m), weight 93.4 kg (205 lb 14.4 oz), SpO2 94 %.  PHYSICAL  EXAMINATION:  GENERAL:  83 y.o.-year-old patient lying in the bed with no acute distress.  EYES: Pupils equal, round, reactive to light and accommodation. No scleral icterus. Extraocular muscles intact.  HEENT: Head atraumatic, normocephalic. Oropharynx and nasopharynx clear.  NECK:  Supple, no jugular venous distention. No thyroid enlargement, no tenderness.  LUNGS: Normal breath sounds bilaterally, have b/l wheezing, some crepitation. No use of accessory muscles of respiration.  CARDIOVASCULAR: S1, S2 normal. No murmurs, rubs, or gallops.  ABDOMEN: Soft, nontender, nondistended. Bowel sounds present. No organomegaly or mass.  EXTREMITIES: b/l pedal edema,no cyanosis, or clubbing.  NEUROLOGIC: Cranial nerves II through XII are intact. Muscle strength 5/5 in all extremities. Sensation intact. Gait not checked.  PSYCHIATRIC: The patient is alert and oriented x 3.  SKIN: No obvious rash, lesion, or ulcer.   Physical Exam LABORATORY PANEL:   CBC  Recent Labs Lab 12/22/16 0524  WBC 4.2  HGB 8.3*  HCT 26.0*  PLT 217   ------------------------------------------------------------------------------------------------------------------  Chemistries   Recent Labs Lab 12/18/16 0548  12/21/16 0453  NA 134*  < > 135  K 6.4*  < > 4.8  CL 101  < > 98*  CO2 25  < > 29  GLUCOSE 178*  < > 171*  BUN 36*  < > 50*  CREATININE 2.08*  < > 1.51*  CALCIUM 8.3*  < > 8.4*  AST 26  --   --   ALT 15*  --   --   ALKPHOS 67  --   --   BILITOT 0.4  --   --   < > =  values in this interval not displayed. ------------------------------------------------------------------------------------------------------------------  Cardiac Enzymes  Recent Labs Lab 12/19/16 0734 12/20/16 0641  TROPONINI 1.22* 0.87*   ------------------------------------------------------------------------------------------------------------------  RADIOLOGY:  Dg Chest Port 1 View  Result Date: 12/21/2016 CLINICAL DATA:   Pulmonary edema EXAM: PORTABLE CHEST 1 VIEW COMPARISON:  12/20/2016 in FINDINGS: Normal cardiac silhouette. Mild improvement in interstitial edema pattern. Chronic interstitial lung disease pattern remains. No pneumothorax. No focal consolidation. IMPRESSION: 1. Improvement in interstitial edema pattern. 2. Chronic interstitial lung pattern again noted. Electronically Signed   By: Genevive Bi M.D.   On: 12/21/2016 08:00    ASSESSMENT AND PLAN:   Principal Problem:   Acute CHF (HCC) Active Problems:   Acute CHF (congestive heart failure) (HCC)  * Acute hypoxic respiratory failure   Acute CHF exacerbation   do not have previous echocardiogram so unable to decide systolic versus diastolic at this point.   IV Lasix, continue BiPAP support, intensivist consult.   Intake and output monitoring, strict fluid restriction orally, daily weight,appreciated cardiology consult.   Monitor on telemetry, followed serial troponin.  * Acute on chronic renal failure with hyperkalemia   Kayexalate given.   No EKG changes   Follow renal function and potassium level.   Improved.  * ac COPD exacerbation   manage per intensivist.   Steroids, nebs and bipap.   Improved , on nasal canula oxygen now.  * Diabetes   Hold metformin because of renal failure, keep on sliding scale coverage.  * Elevated troponin   Likely stress ischemia, follow serial troponin.   Started on heparine drip, cardiology to decide about interventions.  * Anemia   Likely due to chronic renal failure, continue monitoring.   As on heparine drip and significant drop- check stool guiac.   Stable now.  * Urinary tract infection   IV ceftriaxone, follow urine culture.   All the records are reviewed and case discussed with Care Management/Social Workerr. Management plans discussed with the patient, family and they are in agreement.  CODE STATUS: DNR  TOTAL TIME TAKING CARE OF THIS PATIENT: 35 minutes.   POSSIBLE D/C  IN 2-3 DAYS, DEPENDING ON CLINICAL CONDITION.   Altamese Dilling M.D on 12/22/2016   Between 7am to 6pm - Pager - [phone]  After 6pm go to [email or web address] - password [email or web address]  [facility]  Office  [phone]  CC: Primary care physician; Leanna Sato, MD  Note: This dictation was prepared with Dragon dictation along with smaller phrase technology. Any transcriptional errors that result from this process are unintentional.

## 2016-12-22 NOTE — Care Management (Signed)
Spoke with donna from [facility] and informed there would be nothing to prevent patient from returning to the facility.  Pam- patient's daughter is asking about having hospice services at discharge. [facility]   Reached out to Servando Snare, who will speak to palliative nurse that was following patient at [facility].  He most likely will require chronic 02.  If to be followed by hospice, will not have to qualify.  If to be followed by home health- will need to be qualified.

## 2016-12-22 NOTE — Plan of Care (Signed)
Problem: Activity: Goal: Capacity to carry out activities will improve Outcome: Not Progressing Audible wheezing and dyspnea with minimal activity, repositioning in bed noted.

## 2016-12-22 NOTE — Progress Notes (Signed)
[facility] [facility] [place] at [facility]   PATIENT NAME: Timothy Hays    MR#:  [id number]  DATE OF BIRTH:  Jun 12, 1919  SUBJECTIVE:  CHIEF COMPLAINT:   Chief Complaint  Patient presents with  . Respiratory Distress     Came with hypoxia and respi distress.   Failed weaning bipap yesterday- and had to restart on that.   Comfortable on nasal canula oxygen today.   Have some increased cough today,  REVIEW OF SYSTEMS:  CONSTITUTIONAL: No fever, fatigue or weakness.  EYES: No blurred or double vision.  EARS, NOSE, AND THROAT: No tinnitus or ear pain.  RESPIRATORY: positive for cough, shortness of breath, no wheezing or hemoptysis.  CARDIOVASCULAR: No chest pain, orthopnea, edema.  GASTROINTESTINAL: No nausea, vomiting, diarrhea or abdominal pain.  GENITOURINARY: No dysuria, hematuria.  ENDOCRINE: No polyuria, nocturia,  HEMATOLOGY: No anemia, easy bruising or bleeding SKIN: No rash or lesion. MUSCULOSKELETAL: No joint pain or arthritis.   NEUROLOGIC: No tingling, numbness, weakness.  PSYCHIATRY: No anxiety or depression.   ROS  DRUG ALLERGIES:   Allergies  Allergen Reactions  . Fish Allergy Anaphylaxis  . Keflex [Cephalexin] Nausea And Vomiting    Daughter advises that his vomiting is extremely "voilent and severe".  . Morphine And Related Nausea And Vomiting  . Penicillins     Has patient had a PCN reaction causing immediate rash, facial/tongue/throat swelling,} If all of the above answers are "NO", then may proceed with Cephalosporin use. SOB or lightheadedness with hypotension: No Has patient had a PCN reaction causing severe rash involving mucus membranes or skin necrosis: Unknown Has patient had a PCN reaction that required hospitalization Unknown Has patient had a PCN reaction occurring within the last 10 years: No     VITALS:  Blood pressure 130/66, pulse 73, temperature 97.8 F (36.6 C), temperature source Oral, resp. rate 20, height  (1.676 m),  weight 93.4 kg (205 lb 14.4 oz), SpO2 98 %.  PHYSICAL EXAMINATION:  GENERAL:  83 y.o.-year-old patient lying in the bed with no acute distress.  EYES: Pupils equal, round, reactive to light and accommodation. No scleral icterus. Extraocular muscles intact.  HEENT: Head atraumatic, normocephalic. Oropharynx and nasopharynx clear.  NECK:  Supple, no jugular venous distention. No thyroid enlargement, no tenderness.  LUNGS: Normal breath sounds bilaterally, have b/l wheezing, some crepitation. No use of accessory muscles of respiration.  CARDIOVASCULAR: S1, S2 normal. No murmurs, rubs, or gallops.  ABDOMEN: Soft, nontender, nondistended. Bowel sounds present. No organomegaly or mass.  EXTREMITIES: b/l pedal edema,no cyanosis, or clubbing.  NEUROLOGIC: Cranial nerves II through XII are intact. Muscle strength 5/5 in all extremities. Sensation intact. Gait not checked.  PSYCHIATRIC: The patient is alert and oriented x 3.  SKIN: No obvious rash, lesion, or ulcer.   Physical Exam LABORATORY PANEL:   CBC  Recent Labs Lab 12/22/16 0524  WBC 4.2  HGB 8.3*  HCT 26.0*  PLT 217   ------------------------------------------------------------------------------------------------------------------  Chemistries   Recent Labs Lab 12/18/16 0548  12/22/16 0531  NA 134*  < > 134*  K 6.4*  < > 5.0  CL 101  < > 97*  CO2 25  < > 30  GLUCOSE 178*  < > 149*  BUN 36*  < > 48*  CREATININE 2.08*  < > 1.33*  CALCIUM 8.3*  < > 8.6*  AST 26  --   --   ALT 15*  --   --   ALKPHOS 67  --   --  BILITOT 0.4  --   --   < > = values in this interval not displayed. ------------------------------------------------------------------------------------------------------------------  Cardiac Enzymes  Recent Labs Lab 12/19/16 0734 12/20/16 0641  TROPONINI 1.22* 0.87*   ------------------------------------------------------------------------------------------------------------------  RADIOLOGY:  Dg  Chest 2 View  Result Date: 12/22/2016 CLINICAL DATA:  Hypoxia. EXAM: CHEST  2 VIEW COMPARISON:  12/21/2016 . FINDINGS: Cardiomegaly with mild bilateral interstitial prominence consistent with mild CHF. A component of chronic underlying interstitial lung disease most likely present. Small right pleural effusion crash that small bilateral pleural effusions. No pneumothorax. No acute bony abnormality . IMPRESSION: Congestive heart failure with mild bilateral interstitial edema. A component of chronic underlying interstitial lung disease most likely present. Electronically Signed   By: Maisie Fus  Register   On: 12/22/2016 15:47   Dg Chest Port 1 View  Result Date: 12/21/2016 CLINICAL DATA:  Pulmonary edema EXAM: PORTABLE CHEST 1 VIEW COMPARISON:  12/20/2016 in FINDINGS: Normal cardiac silhouette. Mild improvement in interstitial edema pattern. Chronic interstitial lung disease pattern remains. No pneumothorax. No focal consolidation. IMPRESSION: 1. Improvement in interstitial edema pattern. 2. Chronic interstitial lung pattern again noted. Electronically Signed   By: Genevive Bi M.D.   On: 12/21/2016 08:00    ASSESSMENT AND PLAN:   Principal Problem:   Acute CHF (HCC) Active Problems:   Acute CHF (congestive heart failure) (HCC)  * Acute hypoxic respiratory failure   Acute CHF exacerbation and COPD exacerbation.   do not have previous echocardiogram so unable to decide systolic versus diastolic at this point.   IV Lasix, continue BiPAP support, intensivist consult.   Intake and output monitoring, strict fluid restriction orally, daily weight,appreciated cardiology consult.   Monitor on telemetry, followed serial troponin.  * Acute on chronic renal failure with hyperkalemia   Kayexalate given.   No EKG changes   Follow renal function and potassium level.   Improved.  * ac COPD exacerbation   manage per intensivist.   Steroids, nebs and bipap.   Improved , on nasal canula oxygen  now.   May need home o2.   Will repeat xray chest , as he still have SOB.  * Diabetes   Hold metformin because of renal failure, keep on sliding scale coverage.  * Elevated troponin   Likely stress ischemia, follow serial troponin.   Started on heparine drip, cardiology to decide about interventions.  * Anemia   Likely due to chronic renal failure, continue monitoring.   As on heparine drip and significant drop- check stool guiac.   Stable now.  * Urinary tract infection   IV ceftriaxone, follow urine culture.   All the records are reviewed and case discussed with Care Management/Social Workerr. Management plans discussed with the patient, family and they are in agreement.  CODE STATUS: DNR  TOTAL TIME TAKING CARE OF THIS PATIENT: 35 minutes.   POSSIBLE D/C IN 2-3 DAYS, DEPENDING ON CLINICAL CONDITION. Pt's daughter asked to take him home place instead of rehab on d/c and to have hospice follow there.  Altamese Dilling M.D on 12/22/2016   Between 7am to 6pm - Pager - [phone]  After 6pm go to [email or web address] - password [email or web address]  [facility]  Office  [phone]  CC: Primary care physician; Leanna Sato, MD  Note: This dictation was prepared with Dragon dictation along with smaller phrase technology. Any transcriptional errors that result from this process are unintentional.

## 2016-12-22 NOTE — NC FL2 (Signed)
[place] MEDICAID FL2 LEVEL OF CARE SCREENING TOOL     IDENTIFICATION  Patient Name: Timothy Hays Birthdate: Jan 17, 1919 Sex: male Admission Date (Current Location): 12/18/2016  [facility] and [place] Number:  Chiropodist and Address:  [facility], [address], [place], [place] [address]      Provider Number: [id number]  Attending Physician Name and Address:  Altamese Dilling, MD  Relative Name and Phone Number:       Current Level of Care: Hospital Recommended Level of Care: Assisted Living Facility Prior Approval Number:    Date Approved/Denied:   PASRR Number:  [id number])  Discharge Plan: Domiciliary (Rest home)    Current Diagnoses: Patient Active Problem List   Diagnosis Date Noted  . Acute CHF (HCC) 12/18/2016  . Acute CHF (congestive heart failure) (HCC) 12/18/2016  . Sepsis (HCC) 09/02/2016    Orientation RESPIRATION BLADDER Height & Weight     Self, Time  O2 (2 Liters Oxygen ) Incontinent Weight: 205 lb 14.4 oz (93.4 kg) Height:   (167.6 cm)  BEHAVIORAL SYMPTOMS/MOOD NEUROLOGICAL BOWEL NUTRITION STATUS   (none)  (none) Continent Diet (Diet: Heart Healthy/ Carb Modified )  AMBULATORY STATUS COMMUNICATION OF NEEDS Skin   Total Care (Wheel Chair Bound. Facility uses a lift. ) Verbally Normal                       Personal Care Assistance Level of Assistance  Bathing, Feeding, Dressing Bathing Assistance: Limited assistance Feeding assistance: Limited assistance Dressing Assistance: Limited assistance     Functional Limitations Info  Sight, Hearing, Speech Sight Info: Adequate Hearing Info: Adequate Speech Info: Adequate    SPECIAL CARE FACTORS FREQUENCY   (Patient will have hospice services through [place]/ [place] )                    Contractures      Additional Factors Info  Code Status, Allergies Code Status Info:  (DNR ) Allergies Info:  (Fish Allergy, Keflex Cephalexin,  Morphine And Related, Penicillins)           Current Medications (12/22/2016):  This is the current hospital active medication list Current Facility-Administered Medications  Medication Dose Route Frequency Provider Last Rate Last Dose  . acetaminophen (TYLENOL) tablet 500 mg  500 mg Oral Q4H PRN Altamese Dilling, MD      . albuterol (PROVENTIL) (2.5 MG/3ML) 0.083% nebulizer solution 2.5 mg  2.5 mg Nebulization Q4H PRN Altamese Dilling, MD      . benzonatate (TESSALON) capsule 100 mg  100 mg Oral BID PRN Lewie Loron, NP   100 mg at 12/20/16 2155  . budesonide (PULMICORT) nebulizer solution 0.5 mg  0.5 mg Nebulization BID Erin Fulling, MD   0.5 mg at 12/22/16 0829  . cefTRIAXone (ROCEPHIN) 1 g in dextrose 5 % 50 mL IVPB  1 g Intravenous Q24H Altamese Dilling, MD   1 g at 12/22/16 0839  . docusate sodium (COLACE) capsule 100 mg  100 mg Oral BID PRN Altamese Dilling, MD      . furosemide (LASIX) injection 20 mg  20 mg Intravenous Q12H Altamese Dilling, MD   20 mg at 12/22/16 0839  . gabapentin (NEURONTIN) capsule 200 mg  200 mg Oral BID WC Altamese Dilling, MD   200 mg at 12/22/16 1219  . gabapentin (NEURONTIN) capsule 300 mg  300 mg Oral QHS Altamese Dilling, MD   300 mg at 12/21/16 2243  .  guaiFENesin-codeine 100-10 MG/5ML solution 10 mL  10 mL Oral Q6H PRN Lewie Loron, NP   10 mL at 12/19/16 2203  . insulin aspart (novoLOG) injection 0-9 Units  0-9 Units Subcutaneous TID WC & HS Altamese Dilling, MD   3 Units at 12/22/16 1219  . ipratropium-albuterol (DUONEB) 0.5-2.5 (3) MG/3ML nebulizer solution 3 mL  3 mL Nebulization Q4H Erin Fulling, MD   3 mL at 12/22/16 1156  . latanoprost (XALATAN) 0.005 % ophthalmic solution 1 drop  1 drop Right Eye QHS Altamese Dilling, MD   1 drop at 12/20/16 2122  . MEDLINE mouth rinse  15 mL Mouth Rinse BID Altamese Dilling, MD   15 mL at 12/21/16 2244  . methylPREDNISolone sodium succinate (SOLU-MEDROL)  40 mg/mL injection 40 mg  40 mg Intravenous Q12H Erin Fulling, MD   40 mg at 12/22/16 0840  . nitroGLYCERIN (NITROSTAT) SL tablet 0.4 mg  0.4 mg Sublingual Q5 min PRN Altamese Dilling, MD   0.4 mg at 12/20/16 0340  . pantoprazole (PROTONIX) EC tablet 40 mg  40 mg Oral Daily Eugenie Norrie, NP   40 mg at 12/22/16 0840  . polyethylene glycol (MIRALAX / GLYCOLAX) packet 17 g  17 g Oral Daily PRN Altamese Dilling, MD   17 g at 12/20/16 2159  . sodium chloride flush (NS) 0.9 % injection 3 mL  3 mL Intravenous Q12H Altamese Dilling, MD   3 mL at 12/22/16 0840     Discharge Medications: Please see discharge summary for a list of discharge medications.  Relevant Imaging Results:  Relevant Lab Results:   Additional Information  (SSN: [id number])  Sample, Darleen Crocker, LCSW

## 2016-12-22 NOTE — Care Management (Signed)
Have requested home oxygen assessment.

## 2016-12-22 NOTE — Discharge Instructions (Signed)
Heart Failure Clinic appointment on January 01, 2017 at 11:40am with Clarisa Kindred, FNP. Please call [phone] to reschedule.

## 2016-12-22 NOTE — Clinical Social Work Note (Signed)
Clinical Social Work Assessment  Patient Details  Name: Timothy Hays MRN: [id number] Date of Birth: 1919-07-11  Date of referral:  12/22/16               Reason for consult:  Other (Comment Required) (From [facility] )                Permission sought to share information with:  Facility Industrial/product designer granted to share information::  Yes, Verbal Permission Granted  Name::        Agency::     Relationship::     Contact Information:     Housing/Transportation Living arrangements for the past 2 months:  Assisted Living Facility Source of Information:  Adult Children, Facility Patient Interpreter Needed:  None Criminal Activity/Legal Involvement Pertinent to Current Situation/Hospitalization:  No - Comment as needed Significant Relationships:  Adult Children Lives with:  Facility Resident Do you feel safe going back to the place where you live?    Need for family participation in patient care:  Yes (Comment)  Care giving concerns:  [name] a long term care resident at [facility] (fax: [phone]).   Social Worker assessment / plan:  Visual merchandiser (CSW) received consult that patient is from [facility]. Per [facility] administrator patient has been a resident for less then 1 month and is private pay. Per Kendal Hymen patient does not walk and uses a sit to stand lift at baseline. Per Kendal Hymen patient's daughter Timothy Hays is HPOA. CSW made Kendal Hymen aware that patient will return with oxygen. Per Kendal Hymen patient can return to [facility] when stable. CSW contacted patient's daughter Timothy Hays. Daughter is agreeable for patient to return to [facility] via EMS. CSW received a call from [facility] [place] [place] hospice liaison stating that patient will have hospice following at [facility] and hospice will make arrangements for the oxygen. CSW will continue to follow and assist as needed.  Plan is for patient to D/C to [facility] via EMS with [place] [place]  hospice following.   Employment status:  Retired Health and safety inspector:  Medicare PT Recommendations:  Not assessed at this time Information / Referral to community resources:  Other (Comment Required) (Will return to [facility] with hospice services. )  Patient/Family's Response to care:  Patient's daughter Timothy Hays is agreeable for patient to return to [facility] with hospice.   Patient/Family's Understanding of and Emotional Response to Diagnosis, Current Treatment, and Prognosis:  Patient's daughter Timothy Hays was very pleasant and thanked CSW for assistance.   Emotional Assessment Appearance:  Appears stated age Attitude/Demeanor/Rapport:  Unable to Assess Affect (typically observed):  Unable to Assess Orientation:  Oriented to Self, Fluctuating Orientation (Suspected and/or reported Sundowners) Alcohol / Substance use:  Not Applicable Psych involvement (Current and /or in the community):  No (Comment)  Discharge Needs  Concerns to be addressed:  Discharge Planning Concerns Readmission within the last 30 days:  No Current discharge risk:  Chronically ill Barriers to Discharge:  Continued Medical Work up   [employer], Darleen Crocker, LCSW 12/22/2016, 2:29 PM

## 2016-12-22 NOTE — Progress Notes (Signed)
ANTICOAGULATION CONSULT NOTE - Follow Up Consult  Pharmacy Consult for Heparin Drip Indication: chest pain/ACS       Allergies  Allergen Reactions  . Fish Allergy Anaphylaxis  . Keflex [Cephalexin] Nausea And Vomiting    Daughter advises that his vomiting is extremely "voilent and severe".  . Morphine And Related Nausea And Vomiting  . Penicillins     Has patient had a PCN reaction causing immediate rash, facial/tongue/throat swelling,} If all of the above answers are "NO", then may proceed with Cephalosporin use. SOB or lightheadedness with hypotension: No Has patient had a PCN reaction causing severe rash involving mucus membranes or skin necrosis: Unknown Has patient had a PCN reaction that required hospitalization Unknown Has patient had a PCN reaction occurring within the last 10 years: No     Patient Measurements: Height:  (167.6 cm) Weight: 201 lb 15.1 oz (91.6 kg) IBW/kg (Calculated) : 63.8 Heparin Dosing Weight: 83.3 kg  Vital Signs: Temp: 97.5 F (36.4 C) (04/15 1153) Temp Source: Oral (04/15 1153) BP: 134/69 (04/15 1153) Pulse Rate: 87 (04/15 1153)  Labs:  Recent Labs (last 2 labs)    Recent Labs  12/18/16 1608  12/19/16 0326 12/19/16 0734 12/20/16 0641 12/21/16 0453 12/21/16 1321  HGB --  <> 8.1* --  7.9* 8.2* --   HCT --  --  25.7* --  24.6* 25.3* --   PLT --  --  218 --  218 214 --   APTT 111* --  --  --  --  --  --   LABPROT 15.0 --  --  --  --  --  --   INR 1.17 --  --  --  --  --  --   HEPARINUNFRC --  <> --  0.42 0.34 0.27* 0.42  CREATININE --  --  1.71* --  1.70* 1.51* --   TROPONINI 1.16* <> 1.20* 1.22* 0.87* --  --   < > = values in this interval not displayed.    Estimated Creatinine Clearance: 31 mL/min (A) (by C-G formula based on SCr of 1.51 mg/dL (H)).  Assessment: Pharmacy consulted for heparin drip management for 83 yo male ICU patient admitted with acute CHF  exacerbation and elevated troponins. Patient does not take anticoagulation as an outpatient. Patient currently receiving heparin drip at 1000 units/hr.   Goal of Therapy: Heparin level 0.3-0.7 units/ml Monitor platelets by anticoagulation protocol: Yes  Plan: Continue heparin at 1000 unit/hr. Patient has two consecutive anti-Xa levels in range, will obtain next level with am labs.   4/14 HL@ 0641= 0.34. Will continue current rate of 1000 units/hr. Will check next HL with am labs  4/15 @ 0500 HL 0.27 subtherapeutic. Will rebolus w/ 1500 units IV x 1 and will increase rate to 1150 units/hr and will recheck HL @ 1300.  4/15 HL@ 1321= 0.42. Will continue with current rate and check confirmatory level in 8 hours.   4/15 HL 2100 0.45 therapeutic. Will continue current rate and recheck w/ am labs.  4/16 HL @ 0524 0.40 therapeutic. Will continue current rate and recheck w/ am labs.  Thank you for this consult.  Thomasene Ripple, PharmD, BCPS Clinical Pharmacist 12/22/2016

## 2016-12-23 ENCOUNTER — Encounter: Payer: Self-pay | Admitting: *Deleted

## 2016-12-23 LAB — BASIC METABOLIC PANEL
ANION GAP: 7 (ref 5–15)
BUN: 45 mg/dL — AB (ref 6–20)
CALCIUM: 8.6 mg/dL — AB (ref 8.9–10.3)
CO2: 31 mmol/L (ref 22–32)
Chloride: 96 mmol/L — ABNORMAL LOW (ref 101–111)
Creatinine, Ser: 1.1 mg/dL (ref 0.61–1.24)
GFR calc Af Amer: >60 mL/min (ref >60)
GFR, EST NON AFRICAN AMERICAN: 55 mL/min — AB (ref >60)
Glucose, Bld: 216 mg/dL — ABNORMAL HIGH (ref 65–99)
POTASSIUM: 5.1 mmol/L (ref 3.5–5.1)
Sodium: 134 mmol/L — ABNORMAL LOW (ref 135–145)

## 2016-12-23 LAB — GLUCOSE, CAPILLARY
Glucose-Capillary: 202 mg/dL — ABNORMAL HIGH (ref 65–99)
Glucose-Capillary: 241 mg/dL — ABNORMAL HIGH (ref 65–99)

## 2016-12-23 MED ORDER — FUROSEMIDE 20 MG PO TABS: 20.0000 mg | ORAL_TABLET | Freq: Two times a day (BID) | ORAL | 0 refills | Status: DC

## 2016-12-23 MED ORDER — METOPROLOL TARTRATE 25 MG PO TABS: 12.5000 mg | ORAL_TABLET | Freq: Two times a day (BID) | ORAL | 0 refills | Status: AC

## 2016-12-23 MED ORDER — IPRATROPIUM-ALBUTEROL 0.5-2.5 (3) MG/3ML IN SOLN: 3.0000 mL | RESPIRATORY_TRACT | 1 refills | Status: AC

## 2016-12-23 MED ORDER — PREDNISONE 10 MG (21) PO TBPK: ORAL_TABLET | ORAL | 0 refills | Status: DC

## 2016-12-23 MED ORDER — BUDESONIDE 0.5 MG/2ML IN SUSP: 0.5000 mg | Freq: Two times a day (BID) | RESPIRATORY_TRACT | 12 refills | Status: AC

## 2016-12-23 NOTE — Clinical Social Work Note (Signed)
CSW spoke to [employer] from [facility], and she said they can accept patient back today.  Plan is for patient to discharge back home with hospice.  CSW to continue to follow patient's progress throughout discharge planning.  Ervin Knack. Nazaire Cordial, MSW, [facility] [phone]  12/23/2016 11:47 AM

## 2016-12-23 NOTE — Progress Notes (Signed)
Follow up visit on new referral for [facility] services at [facility] following discharge. Patient seen, alert and interactive, friends visiting. Patient remains on oxygen at 2 liters. Writer confirmed oxygen and nebulizer machine has been delivered. Plan remains for patient to discharge via EMS back to [facility] today. Hospital care team and his daughter Pam aware. Signed DNR in place in patient's chart. Discharge summary faxed to referral. Thank you.  Dayna Barker RN, BSN, [facility] [place], hospital Liaison [phone] c

## 2016-12-23 NOTE — Care Management Important Message (Signed)
Important Message  Patient Details  Name: Timothy Hays MRN: [id number] Date of Birth: 06-28-1919   Medicare Important Message Given:  Yes Signed IM notice given to daughter Raymon Mutton, RN 12/23/2016, 11:59 AM

## 2016-12-23 NOTE — Plan of Care (Signed)
Problem: Pain Managment: Goal: General experience of comfort will improve Outcome: Completed/Met Date Met: 12/23/16 Pt with no complaints of pain this shift, slept peacefully all night. Will continue to monitor.  Problem: Cardiac: Goal: Ability to achieve and maintain adequate cardiopulmonary perfusion will improve Outcome: Progressing Pt continues to receive IV lasix, with good urine output.

## 2016-12-23 NOTE — Clinical Social Work Note (Signed)
Patient to be d/c'ed today to [facility].  Patient and family agreeable to plans will transport via ems no need for RN to call report patient is going to a group home.  Windell Moulding, MSW, [facility] [phone]

## 2016-12-23 NOTE — Progress Notes (Signed)
EMS at the bedside to transfer patient back to [facility]. Patient's daughter, Elita Quick made aware.

## 2016-12-23 NOTE — Discharge Summary (Signed)
[facility] [place] [facility]   PATIENT NAME: Timothy Hays    MR#:  [id number]  DATE OF BIRTH:  April 15, 1919  DATE OF ADMISSION:  12/18/2016 ADMITTING PHYSICIAN: Altamese Dilling, MD  DATE OF DISCHARGE: 12/23/2016  PRIMARY CARE PHYSICIAN: Leanna Sato, MD    ADMISSION DIAGNOSIS:  Hyperkalemia [E87.5] Acute pulmonary edema (HCC) [J81.0] Demand ischemia (HCC) [I24.8] Acute CHF (congestive heart failure) (HCC) [I50.9] Elevated troponin I level [R74.8] Acute respiratory failure with hypoxemia (HCC) [J96.01] Acute renal failure superimposed on stage 3 chronic kidney disease, unspecified acute renal failure type (HCC) [N17.9, N18.3]  DISCHARGE DIAGNOSIS:  Principal Problem:   Acute CHF (HCC) Active Problems:   Acute CHF (congestive heart failure) (HCC)   SECONDARY DIAGNOSIS:   Past Medical History:  Diagnosis Date  . Anginal pain (HCC)   . CAD (coronary artery disease)   . CKD (chronic kidney disease), stage III   . Diabetes mellitus without complication (HCC)   . Dyspnea   . GERD (gastroesophageal reflux disease)   . History of gout   . Hyperlipidemia   . Hypertension   . Myocardial infarction (HCC)   . Peripheral vascular disease [facility])     HOSPITAL COURSE:   * Acute hypoxic respiratory failure Acute CHF exacerbation and COPD exacerbation.   do not have previous echocardiogram so unable to decide systolic versus diastolic at this point. IV Lasix, continue BiPAP support, intensivist consult. Intake and output monitoring, strict fluid restriction orally, daily weight,appreciated cardiology consult. Monitor on telemetry, followed serial troponin.   Improved, sending home with hospice services.  * Acute on chronic renal failure with hyperkalemia Kayexalate given. No EKG changes Follow renal function and potassium level.   Improved.  * ac COPD exacerbation   manage per intensivist.   Steroids, nebs and bipap.    Improved , on nasal canula oxygen now.   May need home o2.   Will repeat xray chest , as he still have SOB.   Improved.  * Diabetes Hold metformin because of renal failure, keep on sliding scale coverage.  * Elevated troponin Likely stress ischemia, follow serial troponin.   Started on heparine drip,    Stopped after 48 hrs, as pt is stable and no interventions planned by cardio.  * Anemia Likely due to chronic renal failure, continue monitoring.   As on heparine drip and significant drop- check stool guiac.   Stable now.  * Urinary tract infection IV ceftriaxone, followed urine culture.   Given 5 days therapy in hospital.   DISCHARGE CONDITIONS:   Stable.  CONSULTS OBTAINED:  Treatment Team:  Alwyn Pea, MD  DRUG ALLERGIES:   Allergies  Allergen Reactions  . Fish Allergy Anaphylaxis  . Keflex [Cephalexin] Nausea And Vomiting    Daughter advises that his vomiting is extremely "voilent and severe".  . Morphine And Related Nausea And Vomiting  . Penicillins     Has patient had a PCN reaction causing immediate rash, facial/tongue/throat swelling,} If all of the above answers are "NO", then may proceed with Cephalosporin use. SOB or lightheadedness with hypotension: No Has patient had a PCN reaction causing severe rash involving mucus membranes or skin necrosis: Unknown Has patient had a PCN reaction that required hospitalization Unknown Has patient had a PCN reaction occurring within the last 10 years: No     DISCHARGE MEDICATIONS:   Current Discharge Medication List    START taking these medications   Details  budesonide (PULMICORT) 0.5 MG/2ML nebulizer  solution Take 2 mLs (0.5 mg total) by nebulization 2 (two) times daily. Qty: 100 mL, Refills: 12    furosemide (LASIX) 20 MG tablet Take 1 tablet (20 mg total) by mouth 2 (two) times daily. Qty: 60 tablet, Refills: 0    ipratropium-albuterol (DUONEB) 0.5-2.5 (3) MG/3ML SOLN Take 3 mLs by  nebulization every 4 (four) hours. Qty: 360 mL, Refills: 1    metoprolol tartrate (LOPRESSOR) 25 MG tablet Take 0.5 tablets (12.5 mg total) by mouth 2 (two) times daily. Qty: 60 tablet, Refills: 0    predniSONE (STERAPRED UNI-PAK 21 TAB) 10 MG (21) TBPK tablet Take 6 tabs first day, 5 tab on day 2, then 4 on day 3rd, 3 tabs on day 4th , 2 tab on day 5th, and 1 tab on 6th day. Qty: 21 tablet, Refills: 0      CONTINUE these medications which have NOT CHANGED   Details  acetaminophen (TYLENOL) 500 MG tablet Take 500 mg by mouth every 4 (four) hours as needed for mild pain.     albuterol (PROVENTIL HFA;VENTOLIN HFA) 108 (90 Base) MCG/ACT inhaler Inhale 2 puffs into the lungs every 4 (four) hours as needed for wheezing or shortness of breath.    gabapentin (NEURONTIN) 100 MG capsule Take 200-300 mg by mouth 3 (three) times daily. Take  at 0900 and 1200. Take  at bedtime.    guaiFENesin-dextromethorphan (ROBITUSSIN DM) 100-10 MG/5ML syrup Take 10 mLs by mouth every 4 (four) hours as needed for cough.    Infant Care Products (DERMACLOUD) CREA Apply 1 application topically 2 (two) times daily.    latanoprost (XALATAN) 0.005 % ophthalmic solution Place 1 drop into the right eye at bedtime.    nitroGLYCERIN (NITROSTAT) 0.4 MG SL tablet Place 0.4 mg under the tongue every 5 (five) minutes as needed for chest pain.    pantoprazole (PROTONIX) 40 MG tablet Take 40 mg by mouth daily.     polyethylene glycol (MIRALAX / GLYCOLAX) packet Take 17 g by mouth daily as needed for mild constipation.    Spacer/Aero Chamber Mouthpiece MISC 1 each by Does not apply route every 4 (four) hours as needed (with albuterol).      STOP taking these medications     azithromycin (ZITHROMAX) 250 MG tablet      metFORMIN (GLUCOPHAGE) 500 MG tablet      sulfamethoxazole-trimethoprim (BACTRIM DS,SEPTRA DS) 800-160 MG tablet          DISCHARGE INSTRUCTIONS:    Followed by hospice at home/  If you  experience worsening of your admission symptoms, develop shortness of breath, life threatening emergency, suicidal or homicidal thoughts you must seek medical attention immediately by calling 911 or calling your MD immediately  if symptoms less severe.  You Must read complete instructions/literature along with all the possible adverse reactions/side effects for all the Medicines you take and that have been prescribed to you. Take any new Medicines after you have completely understood and accept all the possible adverse reactions/side effects.   Please note  You were cared for by a hospitalist during your hospital stay. If you have any questions about your discharge medications or the care you received while you were in the hospital after you are discharged, you can call the unit and asked to speak with the hospitalist on call if the hospitalist that took care of you is not available. Once you are discharged, your primary care physician will handle any further medical issues. Please note that NO REFILLS  for any discharge medications will be authorized once you are discharged, as it is imperative that you return to your primary care physician (or establish a relationship with a primary care physician if you do not have one) for your aftercare needs so that they can reassess your need for medications and monitor your lab values.    Today   CHIEF COMPLAINT:   Chief Complaint  Patient presents with  . Respiratory Distress    HISTORY OF PRESENT ILLNESS:  Timothy Hays  is a 83 y.o. male with a known history of Coronary artery disease, diabetes, gout, MI, hyperlipidemia- lives in nursing home, seen today with hypoxia from there and found to be hypoxic up to 80% on arrival to ER. Patient also had some confusion started on BiPAP and on chest x-ray he appeared to have pulmonary edema so given IV Lasix and given to hospitalist team for further management of this issue. Patient was not able to give much  details but he was complaining of some chest tightness and shortness of breath.   VITAL SIGNS:  Blood pressure 119/69, pulse 92, temperature 97.6 F (36.4 C), temperature source Oral, resp. rate 18, height  (1.676 m), weight 93.4 kg (205 lb 12.8 oz), SpO2 98 %.  I/O:    Intake/Output Summary (Last 24 hours) at 12/23/16 1238 Last data filed at 12/23/16 0950  Gross per 24 hour  Intake              603 ml  Output             1150 ml  Net             -547 ml    PHYSICAL EXAMINATION:   GENERAL:  83 y.o.-year-old patient lying in the bed with no acute distress.  EYES: Pupils equal, round, reactive to light and accommodation. No scleral icterus. Extraocular muscles intact.  HEENT: Head atraumatic, normocephalic. Oropharynx and nasopharynx clear.  NECK:  Supple, no jugular venous distention. No thyroid enlargement, no tenderness.  LUNGS: Normal breath sounds bilaterally, have b/l wheezing, some crepitation. No use of accessory muscles of respiration.  CARDIOVASCULAR: S1, S2 normal. No murmurs, rubs, or gallops.  ABDOMEN: Soft, nontender, nondistended. Bowel sounds present. No organomegaly or mass.  EXTREMITIES: b/l pedal edema,no cyanosis, or clubbing.  NEUROLOGIC: Cranial nerves II through XII are intact. Muscle strength 5/5 in all extremities. Sensation intact. Gait not checked.  PSYCHIATRIC: The patient is alert and oriented x 3.  SKIN: No obvious rash, lesion, or ulcer.   DATA REVIEW:   CBC  Recent Labs Lab 12/22/16 0524  WBC 4.2  HGB 8.3*  HCT 26.0*  PLT 217    Chemistries   Recent Labs Lab 12/18/16 0548  12/23/16 0503  NA 134*  < > 134*  K 6.4*  < > 5.1  CL 101  < > 96*  CO2 25  < > 31  GLUCOSE 178*  < > 216*  BUN 36*  < > 45*  CREATININE 2.08*  < > 1.10  CALCIUM 8.3*  < > 8.6*  AST 26  --   --   ALT 15*  --   --   ALKPHOS 67  --   --   BILITOT 0.4  --   --   < > = values in this interval not displayed.  Cardiac Enzymes  Recent Labs Lab  12/20/16 0641  TROPONINI 0.87*    Microbiology Results  Results for orders placed or performed during  the hospital encounter of 12/18/16  Culture, Urine     Status: Abnormal   Collection Time: 12/18/16  8:05 AM  Result Value Ref Range Status   Specimen Description URINE, CATHETERIZED  Final   Special Requests NONE  Final   Culture MULTIPLE SPECIES PRESENT, SUGGEST RECOLLECTION (A)  Final   Report Status 12/19/2016 FINAL  Final  MRSA PCR Screening     Status: None   Collection Time: 12/18/16 10:29 AM  Result Value Ref Range Status   MRSA by PCR NEGATIVE NEGATIVE Final    Comment:        The GeneXpert MRSA Assay (FDA approved for NASAL specimens only), is one component of a comprehensive MRSA colonization surveillance program. It is not intended to diagnose MRSA infection nor to guide or monitor treatment for MRSA infections.     RADIOLOGY:  Dg Chest 2 View  Result Date: 12/22/2016 CLINICAL DATA:  Hypoxia. EXAM: CHEST  2 VIEW COMPARISON:  12/21/2016 . FINDINGS: Cardiomegaly with mild bilateral interstitial prominence consistent with mild CHF. A component of chronic underlying interstitial lung disease most likely present. Small right pleural effusion crash that small bilateral pleural effusions. No pneumothorax. No acute bony abnormality . IMPRESSION: Congestive heart failure with mild bilateral interstitial edema. A component of chronic underlying interstitial lung disease most likely present. Electronically Signed   By: Maisie Fus  Register   On: 12/22/2016 15:47    EKG:   Orders placed or performed during the hospital encounter of 12/18/16  . ED EKG  . ED EKG  . EKG 12-Lead  . EKG 12-Lead      Management plans discussed with the patient, family and they are in agreement.  CODE STATUS:     Code Status Orders        Start     Ordered   12/18/16 1249  Do not attempt resuscitation (DNR)  Continuous    Question Answer Comment  In the event of cardiac or respiratory  ARREST Do not call a "code blue"   In the event of cardiac or respiratory ARREST Do not perform Intubation, CPR, defibrillation or ACLS   In the event of cardiac or respiratory ARREST Use medication by any route, position, wound care, and other measures to relive pain and suffering. May use oxygen, suction and manual treatment of airway obstruction as needed for comfort.      12/18/16 1249    Code Status History    Date Active Date Inactive Code Status Order ID Comments User Context   12/18/2016  7:20 AM 12/18/2016 12:49 PM DNR [id number]  Loleta Rose, MD ED   09/02/2016  3:16 PM 09/03/2016 10:28 PM DNR [id number]  Wyatt Haste, MD ED   09/02/2016  3:00 PM 09/02/2016  3:15 PM Full Code [id number]  Wyatt Haste, MD ED    Advance Directive Documentation     Most Recent Value  Type of Advance Directive  Healthcare Power of Attorney  Pre-existing out of facility DNR order (yellow form or pink MOST form)  -  "MOST" Form in Place?  -      TOTAL TIME TAKING CARE OF THIS PATIENT: 35 minutes.    Altamese Dilling M.D on 12/23/2016 at 12:38 PM  Between 7am to 6pm - Pager - [phone]  After 6pm go to [email or web address] - password [email or web address]  [facility]  Office  [phone]  CC: Primary care physician; Leanna Sato, MD   Note: This dictation was prepared with Dragon dictation along with  smaller phrase technology. Any transcriptional errors that result from this process are unintentional.

## 2016-12-23 NOTE — Progress Notes (Signed)
Notified by CCMD that patient converted into UCAF, HR 120-140s. Patient asymptomatic, VSS. Text paged Dr. Elisabeth Pigeon to update on patient status. No new orders.

## 2017-01-01 ENCOUNTER — Ambulatory Visit: Payer: Medicare Other | Admitting: Family

## 2017-03-15 ENCOUNTER — Encounter: Payer: Self-pay | Admitting: Emergency Medicine

## 2017-03-15 ENCOUNTER — Inpatient Hospital Stay
Admission: EM | Admit: 2017-03-15 | Discharge: 2017-03-17 | DRG: 638 | Disposition: A | Discharge status: Hospice | Attending: Internal Medicine | Admitting: Internal Medicine

## 2017-03-15 DIAGNOSIS — W050XXA Fall from non-moving wheelchair, initial encounter: Secondary | ICD-10-CM | POA: Diagnosis present

## 2017-03-15 DIAGNOSIS — I442 Atrioventricular block, complete: Secondary | ICD-10-CM | POA: Diagnosis present

## 2017-03-15 DIAGNOSIS — W19XXXA Unspecified fall, initial encounter: Secondary | ICD-10-CM

## 2017-03-15 DIAGNOSIS — E1151 Type 2 diabetes mellitus with diabetic peripheral angiopathy without gangrene: Secondary | ICD-10-CM | POA: Diagnosis present

## 2017-03-15 DIAGNOSIS — Z7951 Long term (current) use of inhaled steroids: Secondary | ICD-10-CM

## 2017-03-15 DIAGNOSIS — N390 Urinary tract infection, site not specified: Secondary | ICD-10-CM

## 2017-03-15 DIAGNOSIS — L89302 Pressure ulcer of unspecified buttock, stage 2: Secondary | ICD-10-CM | POA: Diagnosis present

## 2017-03-15 DIAGNOSIS — L8990 Pressure ulcer of unspecified site, unspecified stage: Secondary | ICD-10-CM | POA: Insufficient documentation

## 2017-03-15 DIAGNOSIS — R000 Tachycardia, unspecified: Secondary | ICD-10-CM | POA: Diagnosis present

## 2017-03-15 DIAGNOSIS — I129 Hypertensive chronic kidney disease with stage 1 through stage 4 chronic kidney disease, or unspecified chronic kidney disease: Secondary | ICD-10-CM | POA: Diagnosis present

## 2017-03-15 DIAGNOSIS — I252 Old myocardial infarction: Secondary | ICD-10-CM

## 2017-03-15 DIAGNOSIS — B368 Other specified superficial mycoses: Secondary | ICD-10-CM | POA: Diagnosis present

## 2017-03-15 DIAGNOSIS — F0390 Unspecified dementia without behavioral disturbance: Secondary | ICD-10-CM | POA: Diagnosis present

## 2017-03-15 DIAGNOSIS — R739 Hyperglycemia, unspecified: Secondary | ICD-10-CM | POA: Diagnosis present

## 2017-03-15 DIAGNOSIS — N3000 Acute cystitis without hematuria: Secondary | ICD-10-CM | POA: Diagnosis present

## 2017-03-15 DIAGNOSIS — E1165 Type 2 diabetes mellitus with hyperglycemia: Principal | ICD-10-CM | POA: Diagnosis present

## 2017-03-15 DIAGNOSIS — Z79899 Other long term (current) drug therapy: Secondary | ICD-10-CM

## 2017-03-15 DIAGNOSIS — I2510 Atherosclerotic heart disease of native coronary artery without angina pectoris: Secondary | ICD-10-CM | POA: Diagnosis present

## 2017-03-15 DIAGNOSIS — Z8249 Family history of ischemic heart disease and other diseases of the circulatory system: Secondary | ICD-10-CM

## 2017-03-15 DIAGNOSIS — N179 Acute kidney failure, unspecified: Secondary | ICD-10-CM | POA: Diagnosis present

## 2017-03-15 DIAGNOSIS — Y92129 Unspecified place in nursing home as the place of occurrence of the external cause: Secondary | ICD-10-CM

## 2017-03-15 DIAGNOSIS — E1122 Type 2 diabetes mellitus with diabetic chronic kidney disease: Secondary | ICD-10-CM | POA: Diagnosis present

## 2017-03-15 DIAGNOSIS — E785 Hyperlipidemia, unspecified: Secondary | ICD-10-CM | POA: Diagnosis present

## 2017-03-15 DIAGNOSIS — E860 Dehydration: Secondary | ICD-10-CM | POA: Diagnosis present

## 2017-03-15 DIAGNOSIS — M109 Gout, unspecified: Secondary | ICD-10-CM | POA: Diagnosis present

## 2017-03-15 DIAGNOSIS — E871 Hypo-osmolality and hyponatremia: Secondary | ICD-10-CM | POA: Diagnosis present

## 2017-03-15 DIAGNOSIS — Z880 Allergy status to penicillin: Secondary | ICD-10-CM

## 2017-03-15 DIAGNOSIS — Z885 Allergy status to narcotic agent status: Secondary | ICD-10-CM

## 2017-03-15 DIAGNOSIS — Z66 Do not resuscitate: Secondary | ICD-10-CM | POA: Diagnosis present

## 2017-03-15 DIAGNOSIS — K219 Gastro-esophageal reflux disease without esophagitis: Secondary | ICD-10-CM | POA: Diagnosis present

## 2017-03-15 DIAGNOSIS — S0181XA Laceration without foreign body of other part of head, initial encounter: Secondary | ICD-10-CM | POA: Diagnosis present

## 2017-03-15 DIAGNOSIS — S0093XA Contusion of unspecified part of head, initial encounter: Secondary | ICD-10-CM

## 2017-03-15 DIAGNOSIS — D631 Anemia in chronic kidney disease: Secondary | ICD-10-CM | POA: Diagnosis present

## 2017-03-15 DIAGNOSIS — N183 Chronic kidney disease, stage 3 (moderate): Secondary | ICD-10-CM | POA: Diagnosis present

## 2017-03-15 DIAGNOSIS — Z91013 Allergy to seafood: Secondary | ICD-10-CM

## 2017-03-15 DIAGNOSIS — Z881 Allergy status to other antibiotic agents status: Secondary | ICD-10-CM

## 2017-03-15 DIAGNOSIS — B369 Superficial mycosis, unspecified: Secondary | ICD-10-CM | POA: Diagnosis present

## 2017-03-15 DIAGNOSIS — Z794 Long term (current) use of insulin: Secondary | ICD-10-CM

## 2017-03-15 LAB — GLUCOSE, CAPILLARY: Glucose-Capillary: 588 mg/dL (ref 65–99)

## 2017-03-15 LAB — CBC WITH DIFFERENTIAL/PLATELET
Basophils Absolute: 0 10*3/uL (ref 0–0.1)
Basophils Relative: 0 %
Eosinophils Absolute: 0.1 10*3/uL (ref 0–0.7)
Eosinophils Relative: 1 %
HEMATOCRIT: 29.6 % — AB (ref 40.0–52.0)
Hemoglobin: 9.2 g/dL — ABNORMAL LOW (ref 13.0–18.0)
LYMPHS PCT: 5 %
Lymphs Abs: 0.7 10*3/uL — ABNORMAL LOW (ref 1.0–3.6)
MCH: 25 pg — AB (ref 26.0–34.0)
MCHC: 31 g/dL — AB (ref 32.0–36.0)
MCV: 80.8 fL (ref 80.0–100.0)
MONO ABS: 0.8 10*3/uL (ref 0.2–1.0)
MONOS PCT: 5 %
NEUTROS ABS: 14 10*3/uL — AB (ref 1.4–6.5)
Neutrophils Relative %: 89 %
Platelets: 199 10*3/uL (ref 150–440)
RBC: 3.66 MIL/uL — ABNORMAL LOW (ref 4.40–5.90)
RDW: 20.1 % — AB (ref 11.5–14.5)
WBC: 15.6 10*3/uL — ABNORMAL HIGH (ref 3.8–10.6)

## 2017-03-15 LAB — COMPREHENSIVE METABOLIC PANEL
ALT: 9 U/L — ABNORMAL LOW (ref 17–63)
ANION GAP: 8 (ref 5–15)
AST: 15 U/L (ref 15–41)
Albumin: 3.2 g/dL — ABNORMAL LOW (ref 3.5–5.0)
Alkaline Phosphatase: 64 U/L (ref 38–126)
BILIRUBIN TOTAL: 0.5 mg/dL (ref 0.3–1.2)
BUN: 44 mg/dL — AB (ref 6–20)
CO2: 28 mmol/L (ref 22–32)
Calcium: 8.7 mg/dL — ABNORMAL LOW (ref 8.9–10.3)
Chloride: 90 mmol/L — ABNORMAL LOW (ref 101–111)
Creatinine, Ser: 1.75 mg/dL — ABNORMAL HIGH (ref 0.61–1.24)
GFR calc Af Amer: 36 mL/min — ABNORMAL LOW (ref >60)
GFR, EST NON AFRICAN AMERICAN: 31 mL/min — AB (ref >60)
Glucose, Bld: 593 mg/dL (ref 65–99)
POTASSIUM: 5.1 mmol/L (ref 3.5–5.1)
Sodium: 126 mmol/L — ABNORMAL LOW (ref 135–145)
TOTAL PROTEIN: 7.3 g/dL (ref 6.5–8.1)

## 2017-03-15 LAB — TROPONIN I

## 2017-03-15 MED ORDER — SODIUM CHLORIDE 0.9 % IV BOLUS (SEPSIS)
500.0000 mL | INTRAVENOUS | Status: AC
  Administered 2017-03-15: 500 mL via INTRAVENOUS

## 2017-03-15 MED ORDER — SODIUM CHLORIDE 0.9 % IV BOLUS (SEPSIS)
1000.0000 mL | Freq: Once | INTRAVENOUS | Status: AC
  Administered 2017-03-15: 1000 mL via INTRAVENOUS

## 2017-03-15 NOTE — ED Triage Notes (Signed)
Patient presents to Emergency Department via [facility] with complaints of high CBG from [facility].  Per EMS via staff CBG rising over the last 2 days.  Pt with hx of non-insulin dependent DM and renal failure with prior MI.  Pt with contusion to right forehead as he reached for urinal fell from sitting position forward.  Pt reports no LOC or pain.

## 2017-03-16 ENCOUNTER — Emergency Department

## 2017-03-16 DIAGNOSIS — I252 Old myocardial infarction: Secondary | ICD-10-CM | POA: Diagnosis not present

## 2017-03-16 DIAGNOSIS — B368 Other specified superficial mycoses: Secondary | ICD-10-CM | POA: Diagnosis present

## 2017-03-16 DIAGNOSIS — Y92129 Unspecified place in nursing home as the place of occurrence of the external cause: Secondary | ICD-10-CM | POA: Diagnosis not present

## 2017-03-16 DIAGNOSIS — W050XXA Fall from non-moving wheelchair, initial encounter: Secondary | ICD-10-CM | POA: Diagnosis present

## 2017-03-16 DIAGNOSIS — E860 Dehydration: Secondary | ICD-10-CM | POA: Diagnosis present

## 2017-03-16 DIAGNOSIS — I2510 Atherosclerotic heart disease of native coronary artery without angina pectoris: Secondary | ICD-10-CM | POA: Diagnosis present

## 2017-03-16 DIAGNOSIS — L8990 Pressure ulcer of unspecified site, unspecified stage: Secondary | ICD-10-CM | POA: Insufficient documentation

## 2017-03-16 DIAGNOSIS — E785 Hyperlipidemia, unspecified: Secondary | ICD-10-CM | POA: Diagnosis present

## 2017-03-16 DIAGNOSIS — I442 Atrioventricular block, complete: Secondary | ICD-10-CM | POA: Diagnosis present

## 2017-03-16 DIAGNOSIS — M109 Gout, unspecified: Secondary | ICD-10-CM | POA: Diagnosis present

## 2017-03-16 DIAGNOSIS — E871 Hypo-osmolality and hyponatremia: Secondary | ICD-10-CM | POA: Diagnosis present

## 2017-03-16 DIAGNOSIS — E1165 Type 2 diabetes mellitus with hyperglycemia: Secondary | ICD-10-CM | POA: Diagnosis present

## 2017-03-16 DIAGNOSIS — N179 Acute kidney failure, unspecified: Secondary | ICD-10-CM | POA: Diagnosis present

## 2017-03-16 DIAGNOSIS — L89302 Pressure ulcer of unspecified buttock, stage 2: Secondary | ICD-10-CM | POA: Diagnosis present

## 2017-03-16 DIAGNOSIS — E1122 Type 2 diabetes mellitus with diabetic chronic kidney disease: Secondary | ICD-10-CM | POA: Diagnosis present

## 2017-03-16 DIAGNOSIS — S0181XA Laceration without foreign body of other part of head, initial encounter: Secondary | ICD-10-CM | POA: Diagnosis present

## 2017-03-16 DIAGNOSIS — N3000 Acute cystitis without hematuria: Secondary | ICD-10-CM | POA: Diagnosis present

## 2017-03-16 DIAGNOSIS — N183 Chronic kidney disease, stage 3 (moderate): Secondary | ICD-10-CM | POA: Diagnosis present

## 2017-03-16 DIAGNOSIS — K219 Gastro-esophageal reflux disease without esophagitis: Secondary | ICD-10-CM | POA: Diagnosis present

## 2017-03-16 DIAGNOSIS — R739 Hyperglycemia, unspecified: Secondary | ICD-10-CM | POA: Diagnosis present

## 2017-03-16 DIAGNOSIS — D631 Anemia in chronic kidney disease: Secondary | ICD-10-CM | POA: Diagnosis present

## 2017-03-16 DIAGNOSIS — E1151 Type 2 diabetes mellitus with diabetic peripheral angiopathy without gangrene: Secondary | ICD-10-CM | POA: Diagnosis present

## 2017-03-16 LAB — BASIC METABOLIC PANEL
Anion gap: 7 (ref 5–15)
BUN: 40 mg/dL — ABNORMAL HIGH (ref 6–20)
CHLORIDE: 96 mmol/L — AB (ref 101–111)
CO2: 27 mmol/L (ref 22–32)
CREATININE: 1.38 mg/dL — AB (ref 0.61–1.24)
Calcium: 8.5 mg/dL — ABNORMAL LOW (ref 8.9–10.3)
GFR, EST AFRICAN AMERICAN: 48 mL/min — AB (ref >60)
GFR, EST NON AFRICAN AMERICAN: 42 mL/min — AB (ref >60)
Glucose, Bld: 423 mg/dL — ABNORMAL HIGH (ref 65–99)
Potassium: 4.2 mmol/L (ref 3.5–5.1)
SODIUM: 130 mmol/L — AB (ref 135–145)

## 2017-03-16 LAB — CBC
HCT: 29.7 % — ABNORMAL LOW (ref 40.0–52.0)
Hemoglobin: 9.3 g/dL — ABNORMAL LOW (ref 13.0–18.0)
MCH: 25.4 pg — ABNORMAL LOW (ref 26.0–34.0)
MCHC: 31.5 g/dL — ABNORMAL LOW (ref 32.0–36.0)
MCV: 80.8 fL (ref 80.0–100.0)
PLATELETS: 180 10*3/uL (ref 150–440)
RBC: 3.67 MIL/uL — AB (ref 4.40–5.90)
RDW: 19.6 % — AB (ref 11.5–14.5)
WBC: 13 10*3/uL — AB (ref 3.8–10.6)

## 2017-03-16 LAB — URINALYSIS, COMPLETE (UACMP) WITH MICROSCOPIC
Bilirubin Urine: NEGATIVE
Ketones, ur: NEGATIVE mg/dL
NITRITE: NEGATIVE
PH: 5 (ref 5.0–8.0)
Protein, ur: NEGATIVE mg/dL
Specific Gravity, Urine: 1.017 (ref 1.005–1.030)

## 2017-03-16 LAB — GLUCOSE, CAPILLARY
GLUCOSE-CAPILLARY: 463 mg/dL — AB (ref 65–99)
GLUCOSE-CAPILLARY: 424 mg/dL — AB (ref 65–99)
GLUCOSE-CAPILLARY: 358 mg/dL — AB (ref 65–99)
GLUCOSE-CAPILLARY: 299 mg/dL — AB (ref 65–99)
Glucose-Capillary: 284 mg/dL — ABNORMAL HIGH (ref 65–99)
Glucose-Capillary: 350 mg/dL — ABNORMAL HIGH (ref 65–99)

## 2017-03-16 LAB — MRSA PCR SCREENING: MRSA BY PCR: NEGATIVE

## 2017-03-16 MED ORDER — IPRATROPIUM-ALBUTEROL 0.5-2.5 (3) MG/3ML IN SOLN
3.0000 mL | Freq: Four times a day (QID) | RESPIRATORY_TRACT | Status: DC
  Administered 2017-03-17 (×2): 3 mL via RESPIRATORY_TRACT
  Filled 2017-03-16 (×2): qty 3

## 2017-03-16 MED ORDER — ZINC OXIDE 40 % EX OINT
TOPICAL_OINTMENT | Freq: Two times a day (BID) | CUTANEOUS | Status: DC
  Administered 2017-03-16 – 2017-03-17 (×3): via TOPICAL
  Filled 2017-03-16: qty 114

## 2017-03-16 MED ORDER — SENNOSIDES-DOCUSATE SODIUM 8.6-50 MG PO TABS: 1.0000 | ORAL_TABLET | Freq: Every evening | ORAL | Status: DC | PRN

## 2017-03-16 MED ORDER — INSULIN ASPART 100 UNIT/ML ~~LOC~~ SOLN
5.0000 [IU] | Freq: Once | SUBCUTANEOUS | Status: AC
  Administered 2017-03-16: 5 [IU] via INTRAVENOUS
  Filled 2017-03-16: qty 1

## 2017-03-16 MED ORDER — BISACODYL 5 MG PO TBEC
5.0000 mg | DELAYED_RELEASE_TABLET | Freq: Every day | ORAL | Status: DC | PRN
  Administered 2017-03-16 – 2017-03-17 (×2): 5 mg via ORAL
  Filled 2017-03-16 (×2): qty 1

## 2017-03-16 MED ORDER — SODIUM CHLORIDE 0.9 % IV SOLN
INTRAVENOUS | Status: DC
  Administered 2017-03-16: 04:00:00 via INTRAVENOUS

## 2017-03-16 MED ORDER — GUAIFENESIN-DM 100-10 MG/5ML PO SYRP
10.0000 mL | ORAL_SOLUTION | ORAL | Status: DC | PRN
  Filled 2017-03-16: qty 10

## 2017-03-16 MED ORDER — DEXTROSE 5 % IV SOLN
1.0000 g | INTRAVENOUS | Status: DC
  Administered 2017-03-16: 1 g via INTRAVENOUS
  Filled 2017-03-16 (×2): qty 10

## 2017-03-16 MED ORDER — NITROGLYCERIN 0.4 MG SL SUBL: 0.4000 mg | SUBLINGUAL_TABLET | SUBLINGUAL | Status: DC | PRN

## 2017-03-16 MED ORDER — DEXTROSE 5 % IV SOLN
1.0000 g | Freq: Once | INTRAVENOUS | Status: AC
  Administered 2017-03-16: 1 g via INTRAVENOUS
  Filled 2017-03-16: qty 1

## 2017-03-16 MED ORDER — INSULIN GLARGINE 100 UNIT/ML ~~LOC~~ SOLN
12.0000 [IU] | Freq: Every day | SUBCUTANEOUS | Status: DC
  Administered 2017-03-16: 12 [IU] via SUBCUTANEOUS
  Filled 2017-03-16 (×3): qty 0.12

## 2017-03-16 MED ORDER — NYSTATIN 100000 UNIT/GM EX POWD
Freq: Three times a day (TID) | CUTANEOUS | Status: DC
  Administered 2017-03-16 – 2017-03-17 (×4): via TOPICAL
  Filled 2017-03-16: qty 15

## 2017-03-16 MED ORDER — PANTOPRAZOLE SODIUM 40 MG PO TBEC
40.0000 mg | DELAYED_RELEASE_TABLET | Freq: Every day | ORAL | Status: DC
Start: 2017-03-16 — End: 2017-03-17
  Administered 2017-03-16 – 2017-03-17 (×2): 40 mg via ORAL
  Filled 2017-03-16 (×2): qty 1

## 2017-03-16 MED ORDER — ACETAMINOPHEN 500 MG PO TABS: 500.0000 mg | ORAL_TABLET | ORAL | Status: DC | PRN

## 2017-03-16 MED ORDER — FUROSEMIDE 40 MG PO TABS
40.0000 mg | ORAL_TABLET | Freq: Every day | ORAL | Status: DC
  Administered 2017-03-16 – 2017-03-17 (×2): 40 mg via ORAL
  Filled 2017-03-16 (×2): qty 1

## 2017-03-16 MED ORDER — DOCUSATE SODIUM 100 MG PO CAPS
100.0000 mg | ORAL_CAPSULE | Freq: Every day | ORAL | Status: DC
  Administered 2017-03-16: 21:00:00 100 mg via ORAL
  Filled 2017-03-16: qty 1

## 2017-03-16 MED ORDER — HEPARIN SODIUM (PORCINE) 5000 UNIT/ML IJ SOLN
5000.0000 [IU] | Freq: Three times a day (TID) | INTRAMUSCULAR | Status: DC
  Administered 2017-03-16 – 2017-03-17 (×4): 5000 [IU] via SUBCUTANEOUS
  Filled 2017-03-16 (×4): qty 1

## 2017-03-16 MED ORDER — DEXTROSE 5 % IV SOLN
500.0000 mg | Freq: Two times a day (BID) | INTRAVENOUS | Status: DC
  Filled 2017-03-16 (×2): qty 0.5

## 2017-03-16 MED ORDER — ONDANSETRON HCL 4 MG PO TABS: 4.0000 mg | ORAL_TABLET | Freq: Four times a day (QID) | ORAL | Status: DC | PRN

## 2017-03-16 MED ORDER — ALBUTEROL SULFATE (2.5 MG/3ML) 0.083% IN NEBU: 2.5000 mg | INHALATION_SOLUTION | RESPIRATORY_TRACT | Status: DC | PRN

## 2017-03-16 MED ORDER — IPRATROPIUM-ALBUTEROL 0.5-2.5 (3) MG/3ML IN SOLN
3.0000 mL | RESPIRATORY_TRACT | Status: DC
  Administered 2017-03-16 (×4): 3 mL via RESPIRATORY_TRACT
  Filled 2017-03-16 (×4): qty 3

## 2017-03-16 MED ORDER — SODIUM CHLORIDE 0.9 % IV SOLN
Freq: Once | INTRAVENOUS | Status: AC
  Administered 2017-03-16: 01:00:00 via INTRAVENOUS

## 2017-03-16 MED ORDER — MAGNESIUM CITRATE PO SOLN
1.0000 | Freq: Once | ORAL | Status: DC | PRN
  Filled 2017-03-16: qty 296

## 2017-03-16 MED ORDER — GABAPENTIN 300 MG PO CAPS
300.0000 mg | ORAL_CAPSULE | Freq: Every day | ORAL | Status: DC
  Administered 2017-03-16: 300 mg via ORAL
  Filled 2017-03-16: qty 1

## 2017-03-16 MED ORDER — POLYETHYLENE GLYCOL 3350 17 G PO PACK
17.0000 g | PACK | Freq: Every day | ORAL | Status: DC | PRN
  Administered 2017-03-17: 09:00:00 17 g via ORAL
  Filled 2017-03-16: qty 1

## 2017-03-16 MED ORDER — INSULIN ASPART 100 UNIT/ML ~~LOC~~ SOLN
0.0000 [IU] | SUBCUTANEOUS | Status: DC
  Administered 2017-03-16: 08:00:00 9 [IU] via SUBCUTANEOUS
  Administered 2017-03-16: 13:00:00 5 [IU] via SUBCUTANEOUS
  Filled 2017-03-16 (×2): qty 1

## 2017-03-16 MED ORDER — METOPROLOL TARTRATE 25 MG PO TABS
12.5000 mg | ORAL_TABLET | Freq: Two times a day (BID) | ORAL | Status: DC
  Administered 2017-03-16 – 2017-03-17 (×3): 12.5 mg via ORAL
  Filled 2017-03-16 (×3): qty 1

## 2017-03-16 MED ORDER — INSULIN ASPART 100 UNIT/ML ~~LOC~~ SOLN
0.0000 [IU] | Freq: Every day | SUBCUTANEOUS | Status: DC
  Administered 2017-03-16: 4 [IU] via SUBCUTANEOUS
  Filled 2017-03-16: qty 1

## 2017-03-16 MED ORDER — INSULIN ASPART 100 UNIT/ML ~~LOC~~ SOLN
12.0000 [IU] | Freq: Once | SUBCUTANEOUS | Status: AC
  Administered 2017-03-16: 12 [IU] via SUBCUTANEOUS
  Filled 2017-03-16: qty 1

## 2017-03-16 MED ORDER — BUDESONIDE 0.5 MG/2ML IN SUSP
0.5000 mg | Freq: Two times a day (BID) | RESPIRATORY_TRACT | Status: DC
  Administered 2017-03-16 – 2017-03-17 (×3): 0.5 mg via RESPIRATORY_TRACT
  Filled 2017-03-16 (×3): qty 2

## 2017-03-16 MED ORDER — INSULIN ASPART 100 UNIT/ML ~~LOC~~ SOLN
0.0000 [IU] | Freq: Three times a day (TID) | SUBCUTANEOUS | Status: DC
  Administered 2017-03-16 – 2017-03-17 (×2): 5 [IU] via SUBCUTANEOUS
  Administered 2017-03-17: 9 [IU] via SUBCUTANEOUS
  Filled 2017-03-16 (×3): qty 1

## 2017-03-16 MED ORDER — GABAPENTIN 100 MG PO CAPS
200.0000 mg | ORAL_CAPSULE | Freq: Two times a day (BID) | ORAL | Status: DC
  Administered 2017-03-16 – 2017-03-17 (×4): 200 mg via ORAL
  Filled 2017-03-16 (×4): qty 2

## 2017-03-16 MED ORDER — FERROUS SULFATE 325 (65 FE) MG PO TABS
325.0000 mg | ORAL_TABLET | Freq: Every day | ORAL | Status: DC
  Administered 2017-03-16: 325 mg via ORAL
  Filled 2017-03-16: qty 1

## 2017-03-16 MED ORDER — GABAPENTIN 100 MG PO CAPS: 200.0000 mg | ORAL_CAPSULE | Freq: Three times a day (TID) | ORAL | Status: DC

## 2017-03-16 MED ORDER — LATANOPROST 0.005 % OP SOLN
1.0000 [drp] | Freq: Every day | OPHTHALMIC | Status: DC
  Administered 2017-03-16: 21:00:00 1 [drp] via OPHTHALMIC
  Filled 2017-03-16: qty 2.5

## 2017-03-16 MED ORDER — ONDANSETRON HCL 4 MG/2ML IJ SOLN: 4.0000 mg | Freq: Four times a day (QID) | INTRAMUSCULAR | Status: DC | PRN

## 2017-03-16 NOTE — ED Provider Notes (Signed)
[facility] Emergency Department Provider Note  ____________________________________________   First MD Initiated Contact with Patient 03/16/17 0009     (approximate)  I have reviewed the triage vital signs and the nursing notes.   HISTORY  Chief Complaint Hyperglycemia  Level 5 caveat:  history/ROS limited by chronic dementia.  History is provided by daughter  HPI Timothy Hays is a 83 y.o. male with extensive chronic medical history including poorly controlled diabetes on oral anti-glycemic medication and chronic inability to ambulate.  He presents by EMS from his nursing facility for evaluation of high blood sugar, weakness, and a fall.  His blood sugar has been becoming increasingly elevated recently likely due to a recent vacation change.  Additionally the facility is worried he may have a urinary tract infection.  The patient reports that he has been shaking and increasingly weak and he fell out of his wheelchair and hit his forehead on the ground.  He did not lose consciousness and he denies any headache and he is not on anticoagulation.  His symptoms including the hyperglycemia are severe.  Nothing in particular makes the patient's symptoms better nor worse.  He denies chest pain, abdominal pain, nausea or vomiting.  Mild shortness of breath.  The patient uses oxygen at baseline  Past Medical History:  Diagnosis Date  . Anginal pain (HCC)   . CAD (coronary artery disease)   . CKD (chronic kidney disease), stage III   . Diabetes mellitus without complication (HCC)   . Dyspnea   . GERD (gastroesophageal reflux disease)   . History of gout   . Hyperlipidemia   . Hypertension   . Myocardial infarction (HCC)   . Peripheral vascular disease [facility])     Patient Active Problem List   Diagnosis Date Noted  . Acute CHF (HCC) 12/18/2016  . Acute CHF (congestive heart failure) (HCC) 12/18/2016  . Sepsis (HCC) 09/02/2016    History reviewed. No  pertinent surgical history.  Prior to Admission medications   Medication Sig Start Date End Date Taking? Authorizing Provider  acetaminophen (TYLENOL) 500 MG tablet Take 500 mg by mouth every 4 (four) hours as needed for mild pain.    Yes [provider]  albuterol (PROVENTIL HFA;VENTOLIN HFA) 108 (90 Base) MCG/ACT inhaler Inhale 2 puffs into the lungs every 4 (four) hours as needed for wheezing or shortness of breath.   Yes [provider]  budesonide (PULMICORT) 0.5 MG/2ML nebulizer solution Take 2 mLs (0.5 mg total) by nebulization 2 (two) times daily. 12/23/16  Yes Altamese Dilling, MD  docusate sodium (COLACE) 100 MG capsule Take 100 mg by mouth at bedtime.   Yes [provider]  ferrous sulfate 325 (65 FE) MG tablet Take 325 mg by mouth at bedtime.   Yes [provider]  furosemide (LASIX) 20 MG tablet Take 1 tablet (20 mg total) by mouth 2 (two) times daily. Patient taking differently: Take 40 mg by mouth daily.  12/23/16  Yes Altamese Dilling, MD  gabapentin (NEURONTIN) 100 MG capsule Take 200-300 mg by mouth 3 (three) times daily. Take 200mg  at 0900 and 1200. Take 300mg  at bedtime.   Yes [provider]  guaiFENesin-dextromethorphan (ROBITUSSIN DM) 100-10 MG/5ML syrup Take 10 mLs by mouth every 4 (four) hours as needed for cough.   Yes [provider]  ipratropium-albuterol (DUONEB) 0.5-2.5 (3) MG/3ML SOLN Take 3 mLs by nebulization every 4 (four) hours. 12/23/16  Yes Altamese Dilling, MD  latanoprost (XALATAN) 0.005 %  ophthalmic solution Place 1 drop into the right eye at bedtime.   Yes [provider]  metoprolol tartrate (LOPRESSOR) 25 MG tablet Take 0.5 tablets (12.5 mg total) by mouth 2 (two) times daily. 12/23/16  Yes Altamese DillingVachhani, Vaibhavkumar, MD  nitroGLYCERIN (NITROSTAT) 0.4 MG SL tablet Place 0.4 mg under the tongue every 5 (five) minutes as needed for chest pain.   Yes [provider]  ondansetron  (ZOFRAN) 4 MG tablet Take 4 mg by mouth every 6 (six) hours as needed for nausea or vomiting.   Yes [provider]  pantoprazole (PROTONIX) 40 MG tablet Take 40 mg by mouth daily.  06/10/16  Yes [provider]  polyethylene glycol (MIRALAX / GLYCOLAX) packet Take 17 g by mouth daily as needed for mild constipation.   Yes [provider]  TRADJENTA 5 MG TABS tablet Take 5 mg by mouth daily. 03/11/17  Yes [provider]  predniSONE (STERAPRED UNI-PAK 21 TAB) 10 MG (21) TBPK tablet Take 6 tabs first day, 5 tab on day 2, then 4 on day 3rd, 3 tabs on day 4th , 2 tab on day 5th, and 1 tab on 6th day. Patient not taking: Reported on 03/15/2017 12/23/16   Altamese DillingVachhani, Vaibhavkumar, MD    Allergies Fish allergy; Keflex [cephalexin]; Morphine and related; and Penicillins  Family History  Problem Relation Age of Onset  . Hypertension Father   . Diabetes Neg Hx     Social History Social History  Substance Use Topics  . Smoking status: Never Smoker  . Smokeless tobacco: Never Used  . Alcohol use No    Review of Systems Constitutional: No fever/chills Eyes: No visual changes. ENT: No sore throat. Cardiovascular: Denies chest pain. Respiratory: Some shortness of breath but is on oxygen at baseline Gastrointestinal: No abdominal pain.  No nausea, no vomiting.  No diarrhea.  No constipation. Genitourinary: Negative for dysuria. Musculoskeletal: Negative for neck pain.  Negative for back pain. Integumentary: Negative for rash. Neurological: Negative for headaches, focal weakness or numbness.   ____________________________________________   PHYSICAL EXAM:  VITAL SIGNS: ED Triage Vitals  Enc Vitals Group     BP 03/15/17 2234 131/66     Pulse Rate 03/15/17 2234 79     Resp 03/15/17 2234 18     Temp 03/15/17 2234 98.7 F (37.1 C)     Temp Source 03/15/17 2234 Oral     SpO2 03/15/17 2234 98 %     Weight 03/15/17 2241 93.4 kg (206 lb)     Height 03/15/17  2241 1.803 m (5\' 11" )     Head Circumference --      Peak Flow --      Pain Score --      Pain Loc --      Pain Edu? --      Excl. in GC? --     Constitutional: Alert.  Non-ill-apearing. Eyes: Conjunctivae are normal.  Head: Abrasion on right side of forehead Nose: No congestion/rhinnorhea. Mouth/Throat: Mucous membranes are moist. Neck: No stridor.  No meningeal signs.  No cervical spine tenderness to palpation. Cardiovascular: Normal rate, regular rhythm. Good peripheral circulation. Grossly normal heart sounds. Respiratory: Normal respiratory effort on his chronic oxygen.  No retractions. coarse breath sounds in bases. Gastrointestinal: Soft and nontender. No distention.  Musculoskeletal: No lower extremity tenderness nor edema. No gross deformities of extremities. Neurologic:  Normal speech and language. No gross focal neurologic deficits are appreciated.  Skin:  Skin is warm, dry and  intact. No rash noted.  ____________________________________________   LABS (all labs ordered are listed, but only abnormal results are displayed)  Labs Reviewed  URINALYSIS, COMPLETE (UACMP) WITH MICROSCOPIC - Abnormal; Notable for the following:       Result Value   Color, Urine YELLOW (*)    APPearance CLOUDY (*)    Glucose, UA >=500 (*)    Hgb urine dipstick SMALL (*)    Leukocytes, UA LARGE (*)    Bacteria, UA MANY (*)    Squamous Epithelial / LPF 0-5 (*)    All other components within normal limits  CBC WITH DIFFERENTIAL/PLATELET - Abnormal; Notable for the following:    WBC 15.6 (*)    RBC 3.66 (*)    Hemoglobin 9.2 (*)    HCT 29.6 (*)    MCH 25.0 (*)    MCHC 31.0 (*)    RDW 20.1 (*)    Neutro Abs 14.0 (*)    Lymphs Abs 0.7 (*)    All other components within normal limits  COMPREHENSIVE METABOLIC PANEL - Abnormal; Notable for the following:    Sodium 126 (*)    Chloride 90 (*)    Glucose, Bld 593 (*)    BUN 44 (*)    Creatinine, Ser 1.75 (*)    Calcium 8.7 (*)     Albumin 3.2 (*)    ALT 9 (*)    GFR calc non Af Amer 31 (*)    GFR calc Af Amer 36 (*)    All other components within normal limits  GLUCOSE, CAPILLARY - Abnormal; Notable for the following:    Glucose-Capillary 588 (*)    All other components within normal limits  URINE CULTURE  TROPONIN I  CBG MONITORING, ED   ____________________________________________  EKG  ED ECG REPORT I, Yaasir Menken, the attending physician, personally viewed and interpreted this ECG.  Date: 03/15/2017 EKG Time: 22:36 Rate: 79 Rhythm: AV dissociation, RBBB QRS Axis: normal Intervals: normal ST/T Wave abnormalities: Non-specific ST segment / T-wave changes, but no evidence of acute ischemia. Narrative Interpretation: unchanged from prior ECG   ____________________________________________  RADIOLOGY   Ct Head Wo Contrast  Result Date: 03/16/2017 CLINICAL DATA:  Initial evaluation for acute fall, forehead trauma. EXAM: CT HEAD WITHOUT CONTRAST TECHNIQUE: Contiguous axial images were obtained from the base of the skull through the vertex without intravenous contrast. COMPARISON:  Prior CT from 03/14/2013. FINDINGS: Brain: Mild age-related cerebral atrophy with chronic small vessel ischemic disease. Few scattered remote cerebellar infarcts noted. Additional probable tiny remote lacunar infarct within the left thalamus. No acute intracranial hemorrhage. No evidence for acute large vessel territory infarct. No mass lesion, midline shift or mass effect. No hydrocephalus. No extra-axial fluid collection. Vascular: No hyperdense vessel. Scattered vascular calcifications noted within the carotid siphons. Skull: Scalp soft tissues demonstrate no acute abnormality. Calvarium intact. Sinuses/Orbits: Globes and orbital soft tissues demonstrate no acute abnormality. Postsurgical changes noted at the right globe. Patient status post lens extraction bilaterally. Scattered mucosal thickening noted throughout the paranasal  sinuses. Small chronic left mastoid effusion. Right mastoid air cells clear. IMPRESSION: 1. No acute intracranial process identified. 2. Mild age-related cerebral atrophy with chronic small vessel ischemic disease, with few small scattered remote infarcts as above. Electronically Signed   By: Rise Mu M.D.   On: 03/16/2017 00:55   Dg Chest Portable 1 View  Result Date: 03/16/2017 CLINICAL DATA:  Initial evaluation for acute shortness of breath. EXAM: PORTABLE CHEST 1 VIEW COMPARISON:  Prior  radiograph from 12/22/2016. FINDINGS: Moderate cardiomegaly, stable. Mediastinal silhouette within normal limits. Aortic atherosclerosis. Lungs hypoinflated. Diffuse pulmonary vascular congestion with interstitial prominence, consistent with pulmonary edema. No definite focal infiltrates. No obvious pleural effusion. No pneumothorax. No acute osseus abnormality. IMPRESSION: 1. Stable cardiomegaly with mild to moderate pulmonary interstitial edema. 2. Aortic atherosclerosis. Electronically Signed   By: Rise Mu M.D.   On: 03/16/2017 00:46    ____________________________________________   PROCEDURES  Critical Care performed: Yes, see critical care procedure note(s)   Procedure(s) performed:   .Critical Care Performed by: Loleta Rose Authorized by: Loleta Rose   Critical care provider statement:    Critical care time (minutes):  30   Critical care time was exclusive of:  Separately billable procedures and treating other patients   Critical care was necessary to treat or prevent imminent or life-threatening deterioration of the following conditions:  Metabolic crisis   Critical care was time spent personally by me on the following activities:  Development of treatment plan with patient or surrogate, discussions with consultants, evaluation of patient's response to treatment, examination of patient, obtaining history from patient or surrogate, ordering and performing treatments and  interventions, ordering and review of laboratory studies, ordering and review of radiographic studies, pulse oximetry, re-evaluation of patient's condition and review of old charts     ____________________________________________   INITIAL IMPRESSION / ASSESSMENT AND PLAN / ED COURSE  Pertinent labs & imaging results that were available during my care of the patient were reviewed by me and considered in my medical decision making (see chart for details).  The patient does not appear toxic at this time but I am concerned about the fact that he has numerous chronic comorbidities, is 83 years old, is hyperglycemic to nearly 600, has acute renal failure (creatinine 1.7 compared to his usual 1.1), and has a probable urinary tract infection.  I will order a head CT and chest x-ray but I suspect that the problems listed above are the source of his weakness and his increasing instability and some increasing confusion.  He is afebrile.  I will start 1 L of fluids followed by an infusion and will give regular insulin 5 units IV.  Awaiting the results of his studies and then will admit for further management.   Clinical Course as of Mar 16 114  Mon Mar 16, 2017  0048 Grossly positive UA.  History of adverse reactions to cephalosporins and documented PCN allergy.  As per ED Antibitoics order set, giving aztreonam 1 g IV and sending urine culture.  [CF]  0048 No evidence of pneumonia DG Chest Portable 1 View [CF]    Clinical Course User Index [CF] Loleta Rose, MD    ____________________________________________  FINAL CLINICAL IMPRESSION(S) / ED DIAGNOSES  Final diagnoses:  Hyperglycemia  Urinary tract infection without hematuria, site unspecified  Acute renal failure, unspecified acute renal failure type (HCC)  Fall, initial encounter  Contusion of head, unspecified part of head, initial encounter     MEDICATIONS GIVEN DURING THIS VISIT:  Medications  aztreonam (AZACTAM) 1 g in  dextrose 5 % 50 mL IVPB (not administered)  sodium chloride 0.9 % bolus 1,000 mL (0 mLs Intravenous Stopped 03/16/17 0024)  sodium chloride 0.9 % bolus 500 mL (0 mLs Intravenous Stopped 03/16/17 0024)  insulin aspart (novoLOG) injection 5 Units (5 Units Intravenous Given 03/16/17 0112)  0.9 %  sodium chloride infusion ( Intravenous New Bag/Given 03/16/17 0113)     NEW OUTPATIENT MEDICATIONS STARTED DURING THIS  VISIT:  New Prescriptions   No medications on file    Modified Medications   No medications on file    Discontinued Medications   INFANT CARE PRODUCTS (DERMACLOUD) CREA    Apply 1 application topically 2 (two) times daily.   SPACER/AERO CHAMBER MOUTHPIECE MISC    1 each by Does not apply route every 4 (four) hours as needed (with albuterol).     Note:  This document was prepared using Dragon voice recognition software and may include unintentional dictation errors.    Loleta Rose, MD 03/16/17 [phone]

## 2017-03-16 NOTE — Progress Notes (Signed)
Visit made. 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 He is a DNR code with an out of facility DNR in place. [facility] evaluation of high blood sugar. In the ED his blood glucose was 593, he received insulin. Urinalysis showed possible UTI for which he is receiving IV antibiotics. Patient seen lying in bed, easily awakened for visit and interactive with Clinical research associatewriter. He denied pain and stated that "they were trying to get my pressure down". No family at bedside. Plan is for return to [facility] when medically stable. Updated notes faxed to triage. Will continue to follow and update hospice team. Hospital care team all aware of hospice involvement. Thank you. Dayna BarkerKaren Robertson RN, BSN, [facility] [place],  hospital liaison [phone] c

## 2017-03-16 NOTE — Progress Notes (Signed)
Patient ID: TERRIN MEDDAUGH, male   DOB: 1918/09/13, 83 y.o.   MRN: [id number]  [employer] PROGRESS NOTE  MEDARD DECUIR WUJ:[id number] DOB: Sep 14, 1918 DOA: 03/15/2017 PCP: Leanna Sato, MD  HPI/Subjective: Patient seen this morning. Complaining of some pain in the tailbone area and in his groin. Patient also had some burning when he urinated  Objective: Vitals:   03/16/17 0305 03/16/17 1145  BP: 121/70 123/65  Pulse: (!) 114 85  Resp: (!) 21 20  Temp: 97.7 F (36.5 C) 97.7 F (36.5 C)    Filed Weights   03/15/17 2241  Weight: 93.4 kg (206 lb)    ROS: Review of Systems  Constitutional: Negative for chills and fever.  Eyes: Negative for blurred vision.  Respiratory: Positive for shortness of breath. Negative for cough.   Cardiovascular: Negative for chest pain.  Gastrointestinal: Negative for abdominal pain, constipation, diarrhea, nausea and vomiting.  Genitourinary: Positive for dysuria.  Musculoskeletal: Positive for back pain. Negative for joint pain.  Neurological: Negative for dizziness and headaches.   Exam: Physical Exam  HENT:  Nose: No mucosal edema.  Mouth/Throat: No oropharyngeal exudate or posterior oropharyngeal edema.  Eyes: Conjunctivae, EOM and lids are normal. Pupils are equal, round, and reactive to light.  Neck: No JVD present. Carotid bruit is not present. No edema present. No thyroid mass and no thyromegaly present.  Cardiovascular: S1 normal and S2 normal.  Exam reveals no gallop.   No murmur heard. Pulses:      Dorsalis pedis pulses are 2+ on the right side, and 2+ on the left side.  Respiratory: No respiratory distress. He has decreased breath sounds in the right lower field. He has no wheezes. He has no rhonchi. He has no rales.  GI: Soft. Bowel sounds are normal. There is no tenderness.  Musculoskeletal:       Right ankle: He exhibits swelling.       Left ankle: He exhibits swelling.  Lymphadenopathy:    He has no cervical adenopathy.   Neurological: He is alert. No cranial nerve deficit.  Skin: Skin is warm. Nails show no clubbing.  2 stage II decubitus ulcers on the back side. No signs of infection there. Bilateral groin erythema seen  Psychiatric: He has a normal mood and affect.      Data Reviewed: Basic Metabolic Panel:  Recent Labs Lab 03/15/17 2237 03/16/17 0538  NA 126* 130*  K 5.1 4.2  CL 90* 96*  CO2 28 27  GLUCOSE 593* 423*  BUN 44* 40*  CREATININE 1.75* 1.38*  CALCIUM 8.7* 8.5*   Liver Function Tests:  Recent Labs Lab 03/15/17 2237  AST 15  ALT 9*  ALKPHOS 64  BILITOT 0.5  PROT 7.3  ALBUMIN 3.2*   CBC:  Recent Labs Lab 03/15/17 2237 03/16/17 0538  WBC 15.6* 13.0*  NEUTROABS 14.0*  --   HGB 9.2* 9.3*  HCT 29.6* 29.7*  MCV 80.8 80.8  PLT 199 180   Cardiac Enzymes:  Recent Labs Lab 03/15/17 2237  TROPONINI <0.03   BNP (last 3 results)  Recent Labs  12/18/16 0548  BNP 522.0*     CBG:  Recent Labs Lab 03/15/17 2237 03/16/17 0207 03/16/17 0442 03/16/17 0737 03/16/17 1142  GLUCAP 588* 463* 424* 358* 284*    Recent Results (from the past 240 hour(s))  MRSA PCR Screening     Status: None   Collection Time: 03/16/17  5:22 AM  Result Value Ref Range Status   MRSA  by PCR NEGATIVE NEGATIVE Final    Comment:        The GeneXpert MRSA Assay (FDA approved for NASAL specimens only), is one component of a comprehensive MRSA colonization surveillance program. It is not intended to diagnose MRSA infection nor to guide or monitor treatment for MRSA infections.      Studies: Ct Head Wo Contrast  Result Date: 03/16/2017 CLINICAL DATA:  Initial evaluation for acute fall, forehead trauma. EXAM: CT HEAD WITHOUT CONTRAST TECHNIQUE: Contiguous axial images were obtained from the base of the skull through the vertex without intravenous contrast. COMPARISON:  Prior CT from 03/14/2013. FINDINGS: Brain: Mild age-related cerebral atrophy with chronic small vessel ischemic  disease. Few scattered remote cerebellar infarcts noted. Additional probable tiny remote lacunar infarct within the left thalamus. No acute intracranial hemorrhage. No evidence for acute large vessel territory infarct. No mass lesion, midline shift or mass effect. No hydrocephalus. No extra-axial fluid collection. Vascular: No hyperdense vessel. Scattered vascular calcifications noted within the carotid siphons. Skull: Scalp soft tissues demonstrate no acute abnormality. Calvarium intact. Sinuses/Orbits: Globes and orbital soft tissues demonstrate no acute abnormality. Postsurgical changes noted at the right globe. Patient status post lens extraction bilaterally. Scattered mucosal thickening noted throughout the paranasal sinuses. Small chronic left mastoid effusion. Right mastoid air cells clear. IMPRESSION: 1. No acute intracranial process identified. 2. Mild age-related cerebral atrophy with chronic small vessel ischemic disease, with few small scattered remote infarcts as above. Electronically Signed   By: Rise MuBenjamin  McClintock M.D.   On: 03/16/2017 00:55   Dg Chest Portable 1 View  Result Date: 03/16/2017 CLINICAL DATA:  Initial evaluation for acute shortness of breath. EXAM: PORTABLE CHEST 1 VIEW COMPARISON:  Prior radiograph from 12/22/2016. FINDINGS: Moderate cardiomegaly, stable. Mediastinal silhouette within normal limits. Aortic atherosclerosis. Lungs hypoinflated. Diffuse pulmonary vascular congestion with interstitial prominence, consistent with pulmonary edema. No definite focal infiltrates. No obvious pleural effusion. No pneumothorax. No acute osseus abnormality. IMPRESSION: 1. Stable cardiomegaly with mild to moderate pulmonary interstitial edema. 2. Aortic atherosclerosis. Electronically Signed   By: Rise MuBenjamin  McClintock M.D.   On: 03/16/2017 00:46    Scheduled Meds: . budesonide  0.5 mg Nebulization BID  . docusate sodium  100 mg Oral QHS  . ferrous sulfate  325 mg Oral QHS  . furosemide   40 mg Oral Daily  . gabapentin  200 mg Oral BID   And  . gabapentin  300 mg Oral QHS  . heparin  5,000 Units Subcutaneous Q8H  . insulin aspart  0-5 Units Subcutaneous QHS  . insulin aspart  0-9 Units Subcutaneous TID WC  . insulin glargine  12 Units Subcutaneous Daily  . ipratropium-albuterol  3 mL Nebulization Q4H  . latanoprost  1 drop Right Eye QHS  . liver oil-zinc oxide   Topical BID  . metoprolol tartrate  12.5 mg Oral BID  . nystatin   Topical TID  . pantoprazole  40 mg Oral Daily   Continuous Infusions: . cefTRIAXone (ROCEPHIN)  IV 1 g (03/16/17 1301)    Assessment/Plan:  1. Type 2 diabetes uncontrolled. Sugar 588 on presentation. Hemoglobin A1c pending. I started glargine insulin 12 units subcutaneous injection and sliding scale with meals. 2. Acute cystitis with out hematuria on Rocephin follow-up urine culture 3. History of coronary artery disease and myocardial infarction and peripheral vascular disease. Continue metoprolol 4. Acute kidney injury on Chronic kidney disease stage III. Stop hydration since creatinine improved this morning 5. GERD on Protonix 6. Fungal infection groin  on nystatin powder 7. Essential hypertension on metoprolol 8. Stage II decubitus on buttocks. Local wound care  Code Status:     Code Status Orders        Start     Ordered   03/16/17 0408  Do not attempt resuscitation (DNR)  Continuous    Question Answer Comment  In the event of cardiac or respiratory ARREST Do not call a "code blue"   In the event of cardiac or respiratory ARREST Do not perform Intubation, CPR, defibrillation or ACLS   In the event of cardiac or respiratory ARREST Use medication by any route, position, wound care, and other measures to relive pain and suffering. May use oxygen, suction and manual treatment of airway obstruction as needed for comfort.      03/16/17 0407    Code Status History    Date Active Date Inactive Code Status Order ID Comments User Context    03/16/2017  3:08 AM 03/16/2017  4:07 AM Full Code [id number]  Hugelmeyer, Jon Gills, DO Inpatient   03/16/2017  2:00 AM 03/16/2017  3:08 AM DNR [id number]  Tonye Royalty, DO ED   12/18/2016 12:49 PM 12/23/2016  7:52 PM DNR [id number]  Altamese Dilling, MD Inpatient   12/18/2016  7:20 AM 12/18/2016 12:49 PM DNR [id number]  Loleta Rose, MD ED   09/02/2016  3:16 PM 09/03/2016 10:28 PM DNR [id number]  Wyatt Haste, MD ED   09/02/2016  3:00 PM 09/02/2016  3:15 PM Full Code [id number]  Hower, Cletis Athens, MD ED    Advance Directive Documentation     Most Recent Value  Type of Advance Directive  Out of facility DNR (pink MOST or yellow form)  Pre-existing out of facility DNR order (yellow form or pink MOST form)  Yellow form placed in chart (order not valid for inpatient use)  "MOST" Form in Place?  -     Family Communication: Spoke with daughter on the phone Disposition Plan: Likely back to facility tomorrow  Time spent: 35 minutes  Alford Highland  [employer]

## 2017-03-16 NOTE — H&P (Signed)
History and Physical   [facility] - [place] @ [facility] Admission History and Physical [employer], D.O.    Patient Name: Timothy Hays MR#: [id number] Date of Birth: 02-27-1919 Date of Admission: 03/15/2017  Referring MD/NP/PA: Dr. York Cerise Primary Care Physician: Leanna Sato, MD   Chief Complaint:  Chief Complaint  Patient presents with  . Hyperglycemia  Please note the entire history is obtained from the patient's emergency department chart, emergency department provider Patient's personal history is limited by dementia and poor historian.   HPI: Timothy Hays is a 83 y.o. male with CAD, CKD, diabetes, GERD, hypertension, hyperlipidemia, PVD who presents by EMS from his nursing facility for elevated blood sugar. Patient sustained a fall from wheelchair today when he was trying to get up.  He hit his head, but no LOC. He complains only of soreness on his buttocks.    Otherwise there has been no change in status.   There has been no recent illness, hospitalizations, travel or sick contacts.    EMS/ED Course: Patient received Azactam, insulin, normal saline.  Medical admission requested for further management of hyperglycemia, UTI, hyponatremia, dehydration.   Review of Systems:  CONSTITUTIONAL: No fever/chills, fatigue, weakness, weight gain/loss, headache. EYES: No blurry or double vision. ENT: No tinnitus, postnasal drip, redness or soreness of the oropharynx. RESPIRATORY: No cough, dyspnea, wheeze.  No hemoptysis.  CARDIOVASCULAR: No chest pain, palpitations, syncope, orthopnea. No lower extremity edema.  GASTROINTESTINAL: No nausea, vomiting, abdominal pain, diarrhea, constipation.  No hematemesis, melena or hematochezia. GENITOURINARY: No dysuria, frequency, hematuria. ENDOCRINE: No polyuria or nocturia. No heat or cold intolerance. HEMATOLOGY: No anemia, bruising, bleeding. INTEGUMENTARY: Positive soreness on sacrum.  No rashes, ulcers,  lesions. MUSCULOSKELETAL: No arthritis, gout, dyspnea. NEUROLOGIC: No numbness, tingling, ataxia, seizure-type activity, weakness. PSYCHIATRIC: No anxiety, depression, insomnia.   Past Medical History:  Diagnosis Date  . Anginal pain (HCC)   . CAD (coronary artery disease)   . CKD (chronic kidney disease), stage III   . Diabetes mellitus without complication (HCC)   . Dyspnea   . GERD (gastroesophageal reflux disease)   . History of gout   . Hyperlipidemia   . Hypertension   . Myocardial infarction (HCC)   . Peripheral vascular disease (HCC)     History reviewed. No pertinent surgical history.   reports that he has never smoked. He has never used smokeless tobacco. He reports that he does not drink alcohol or use drugs.  Allergies  Allergen Reactions  . Fish Allergy Anaphylaxis  . Keflex [Cephalexin] Nausea And Vomiting    Daughter advises that his vomiting is extremely "voilent and severe".  . Morphine And Related Nausea And Vomiting  . Penicillins     Has patient had a PCN reaction causing immediate rash, facial/tongue/throat swelling,} If all of the above answers are "NO", then may proceed with Cephalosporin use. SOB or lightheadedness with hypotension: No Has patient had a PCN reaction causing severe rash involving mucus membranes or skin necrosis: Unknown Has patient had a PCN reaction that required hospitalization Unknown Has patient had a PCN reaction occurring within the last 10 years: No     Family History  Problem Relation Age of Onset  . Hypertension Father   . Diabetes Neg Hx     Prior to Admission medications   Medication Sig Start Date End Date Taking? Authorizing Provider  acetaminophen (TYLENOL) 500 MG tablet Take 500 mg by mouth every 4 (four) hours as needed for mild pain.  Yes [provider]  albuterol (PROVENTIL HFA;VENTOLIN HFA) 108 (90 Base) MCG/ACT inhaler Inhale 2 puffs into the lungs every 4 (four) hours as needed for wheezing or  shortness of breath.   Yes [provider]  budesonide (PULMICORT) 0.5 MG/2ML nebulizer solution Take 2 mLs (0.5 mg total) by nebulization 2 (two) times daily. 12/23/16  Yes Altamese Dilling, MD  docusate sodium (COLACE) 100 MG capsule Take 100 mg by mouth at bedtime.   Yes [provider]  ferrous sulfate 325 (65 FE) MG tablet Take 325 mg by mouth at bedtime.   Yes [provider]  furosemide (LASIX) 20 MG tablet Take 1 tablet (20 mg total) by mouth 2 (two) times daily. Patient taking differently: Take 40 mg by mouth daily.  12/23/16  Yes Altamese Dilling, MD  gabapentin (NEURONTIN) 100 MG capsule Take 200-300 mg by mouth 3 (three) times daily. Take 200mg  at 0900 and 1200. Take 300mg  at bedtime.   Yes [provider]  guaiFENesin-dextromethorphan (ROBITUSSIN DM) 100-10 MG/5ML syrup Take 10 mLs by mouth every 4 (four) hours as needed for cough.   Yes [provider]  ipratropium-albuterol (DUONEB) 0.5-2.5 (3) MG/3ML SOLN Take 3 mLs by nebulization every 4 (four) hours. 12/23/16  Yes Altamese Dilling, MD  latanoprost (XALATAN) 0.005 % ophthalmic solution Place 1 drop into the right eye at bedtime.   Yes [provider]  metoprolol tartrate (LOPRESSOR) 25 MG tablet Take 0.5 tablets (12.5 mg total) by mouth 2 (two) times daily. 12/23/16  Yes Altamese Dilling, MD  nitroGLYCERIN (NITROSTAT) 0.4 MG SL tablet Place 0.4 mg under the tongue every 5 (five) minutes as needed for chest pain.   Yes [provider]  ondansetron (ZOFRAN) 4 MG tablet Take 4 mg by mouth every 6 (six) hours as needed for nausea or vomiting.   Yes [provider]  pantoprazole (PROTONIX) 40 MG tablet Take 40 mg by mouth daily.  06/10/16  Yes [provider]  polyethylene glycol (MIRALAX / GLYCOLAX) packet Take 17 g by mouth daily as needed for mild constipation.   Yes [provider]  TRADJENTA 5 MG TABS tablet Take 5 mg by mouth  daily. 03/11/17  Yes [provider]  predniSONE (STERAPRED UNI-PAK 21 TAB) 10 MG (21) TBPK tablet Take 6 tabs first day, 5 tab on day 2, then 4 on day 3rd, 3 tabs on day 4th , 2 tab on day 5th, and 1 tab on 6th day. Patient not taking: Reported on 03/15/2017 12/23/16   Altamese Dilling, MD    Physical Exam: Vitals:   03/15/17 2300 03/15/17 2330 03/16/17 0000 03/16/17 0119  BP: 110/64 112/72 118/60 (!) 129/58  Pulse: 75 78 74 75  Resp: 16 16 18 15   Temp:      TempSrc:      SpO2: 99% 100% 100% 100%  Weight:      Height:        GENERAL: 83 y.o.-year-old male patient, well-developed, well-nourished lying in the bed in no acute distress.  Pleasant and cooperative.   HEENT: Head positive superficial laceration to right forehead. Pupils equal, round, reactive to light and accommodation. No scleral icterus. Extraocular muscles intact. . Mucus membranes moist. NECK: Supple, full range of motion.  CHEST: Normal breath sounds bilaterally. No wheezing, rales, rhonchi or crackles. No use of accessory muscles of respiration.  No reproducible chest wall tenderness.  CARDIOVASCULAR: S1, S2 normal.  Cap refill <2 seconds. Pulses intact distally.  ABDOMEN: Soft, nondistended,  nontender. No rebound, guarding, rigidity. Normoactive bowel sounds present in all four quadrants.  EXTREMITIES: No pedal edema, cyanosis, or clubbing. No calf tenderness or Homan's sign.  NEUROLOGIC: The patient is alert and oriented x 3. Cranial nerves II through XII are grossly intact with no focal sensorimotor deficit.   SKIN: Warm, dry, and intact without obvious rash, lesion, or ulcer with the exception of redness and irritation to sacrum (per nursing exam) and superficial skin abrasions bilateral anterior lower extremities.     Labs on Admission:  CBC:  Recent Labs Lab 03/15/17 2237  WBC 15.6*  NEUTROABS 14.0*  HGB 9.2*  HCT 29.6*  MCV 80.8  PLT 199   Basic Metabolic Panel:  Recent Labs Lab  03/15/17 2237  NA 126*  K 5.1  CL 90*  CO2 28  GLUCOSE 593*  BUN 44*  CREATININE 1.75*  CALCIUM 8.7*   GFR: Estimated Creatinine Clearance: 29.5 mL/min (A) (by C-G formula based on SCr of 1.75 mg/dL (H)). Liver Function Tests:  Recent Labs Lab 03/15/17 2237  AST 15  ALT 9*  ALKPHOS 64  BILITOT 0.5  PROT 7.3  ALBUMIN 3.2*   No results for input(s): LIPASE, AMYLASE in the last 168 hours. No results for input(s): AMMONIA in the last 168 hours. Coagulation Profile: No results for input(s): INR, PROTIME in the last 168 hours. Cardiac Enzymes:  Recent Labs Lab 03/15/17 2237  TROPONINI <0.03   BNP (last 3 results) No results for input(s): PROBNP in the last 8760 hours. HbA1C: No results for input(s): HGBA1C in the last 72 hours. CBG:  Recent Labs Lab 03/15/17 2237  GLUCAP 588*   Lipid Profile: No results for input(s): CHOL, HDL, LDLCALC, TRIG, CHOLHDL, LDLDIRECT in the last 72 hours. Thyroid Function Tests: No results for input(s): TSH, T4TOTAL, FREET4, T3FREE, THYROIDAB in the last 72 hours. Anemia Panel: No results for input(s): VITAMINB12, FOLATE, FERRITIN, TIBC, IRON, RETICCTPCT in the last 72 hours. Urine analysis:    Component Value Date/Time   COLORURINE YELLOW (A) 03/16/2017 0027   APPEARANCEUR CLOUDY (A) 03/16/2017 0027   APPEARANCEUR Cloudy 10/02/2013 2350   LABSPEC 1.017 03/16/2017 0027   LABSPEC 1.019 10/02/2013 2350   PHURINE 5.0 03/16/2017 0027   GLUCOSEU >=500 (A) 03/16/2017 0027   GLUCOSEU Negative 10/02/2013 2350   HGBUR SMALL (A) 03/16/2017 0027   BILIRUBINUR NEGATIVE 03/16/2017 0027   BILIRUBINUR Negative 10/02/2013 2350   KETONESUR NEGATIVE 03/16/2017 0027   PROTEINUR NEGATIVE 03/16/2017 0027   NITRITE NEGATIVE 03/16/2017 0027   LEUKOCYTESUR LARGE (A) 03/16/2017 0027   LEUKOCYTESUR 3+ 10/02/2013 2350   Sepsis Labs: @LABRCNTIP (procalcitonin:4,lacticidven:4) )No results found for this or any previous visit (from the past 240  hour(s)).   Radiological Exams on Admission: Ct Head Wo Contrast  Result Date: 03/16/2017 CLINICAL DATA:  Initial evaluation for acute fall, forehead trauma. EXAM: CT HEAD WITHOUT CONTRAST TECHNIQUE: Contiguous axial images were obtained from the base of the skull through the vertex without intravenous contrast. COMPARISON:  Prior CT from 03/14/2013. FINDINGS: Brain: Mild age-related cerebral atrophy with chronic small vessel ischemic disease. Few scattered remote cerebellar infarcts noted. Additional probable tiny remote lacunar infarct within the left thalamus. No acute intracranial hemorrhage. No evidence for acute large vessel territory infarct. No mass lesion, midline shift or mass effect. No hydrocephalus. No extra-axial fluid collection. Vascular: No hyperdense vessel. Scattered vascular calcifications noted within the carotid siphons. Skull: Scalp soft tissues demonstrate no acute abnormality. Calvarium intact. Sinuses/Orbits: Globes and orbital soft tissues demonstrate  no acute abnormality. Postsurgical changes noted at the right globe. Patient status post lens extraction bilaterally. Scattered mucosal thickening noted throughout the paranasal sinuses. Small chronic left mastoid effusion. Right mastoid air cells clear. IMPRESSION: 1. No acute intracranial process identified. 2. Mild age-related cerebral atrophy with chronic small vessel ischemic disease, with few small scattered remote infarcts as above. Electronically Signed   By: Rise MuBenjamin  McClintock M.D.   On: 03/16/2017 00:55   Dg Chest Portable 1 View  Result Date: 03/16/2017 CLINICAL DATA:  Initial evaluation for acute shortness of breath. EXAM: PORTABLE CHEST 1 VIEW COMPARISON:  Prior radiograph from 12/22/2016. FINDINGS: Moderate cardiomegaly, stable. Mediastinal silhouette within normal limits. Aortic atherosclerosis. Lungs hypoinflated. Diffuse pulmonary vascular congestion with interstitial prominence, consistent with pulmonary edema. No  definite focal infiltrates. No obvious pleural effusion. No pneumothorax. No acute osseus abnormality. IMPRESSION: 1. Stable cardiomegaly with mild to moderate pulmonary interstitial edema. 2. Aortic atherosclerosis. Electronically Signed   By: Rise MuBenjamin  McClintock M.D.   On: 03/16/2017 00:46    EKG: AV dissociation at 79 bpm with normal axis, RBBB and nonspecific ST-T wave changes.   Assessment/Plan  This is a 83 y.o. male with a history of coronary artery disease, CK D, diabetes, GERD, hypertension, hyperlipidemia, peripheral vascular disease now being admitted with:  #. Hyperglycemia - Admit inpatient - CBG with RISS coverage q4h - IV fluid hydration  #. Acute kidney injury with hyponatremia, likely secondary to dehydration / infection - IV fluids and repeat BMP in AM.  - Avoid nephrotoxic medications - Bladder scan and place foley catheter if evidence of urinary retention  #. UTI - Continue Azactam - Follow up cultures  #. Fall with head trauma - Monitor neurochecks  #. History of CAD - Continue nitro  #. History of anemia, chronic and stable - Continue monitor CBC  Admission status: Inpatient IV Fluids: NS Diet/Nutrition: NPO Consults called: None DVT Px: Heparin SCDs and early ambulation. Code Status: DNR Disposition Plan: To home in 2-3 days  All the records are reviewed and case discussed with ED provider. Management plans discussed with the patient and/or family who express understanding and agree with plan of care.  Jackquline Branca D.O. on 03/16/2017 at 1:31 AM Between 7am to 6pm - Pager - [phone] After 6pm go to [email or web address] - Social research officer, governmentpassword EPAS [facility] [place] [facility] Office [phone] CC: Primary care physician; Leanna SatoMiles, Linda M, MD   03/16/2017, 1:31 AM

## 2017-03-16 NOTE — Progress Notes (Signed)
PT Cancellation Note  Patient Details Name: Timothy Hays MRN: [id number] DOB: 07/24/1919   Cancelled Treatment:    Reason Eval/Treat Not Completed: Other (comment).  PT consult received.  Chart reviewed.  Per SW, pt is a 83 y.o. hospice pt and uses a hoyer lift to transfer [facility].  PT screen complete: no acute PT needs identified (also discussed with nursing).  Will discontinue current PT order.  Hendricks LimesEmily Latroy Gaymon, PT 03/16/17, 3:30 PM [phone]

## 2017-03-16 NOTE — Consult Note (Signed)
WOC Nurse wound consult note Reason for Consult:Moisture Associated Skin Damage- Incontinence Wound type: MASD to perineal skin folds and sacrum.  Denuded skin to bilateral upper buttocks. Limited bed mobility and pressure have contributed to breakdown.  Pressure Injury POA: Yes Measurement: Bilateral gluteal folds 1 cm x 0.5 cm x 0.1 cm  Generalized erythema to groin and scrotal area.  Wound bed: ruddy red Drainage (amount, consistency, odor) Minimal serosanguinous to buttocks' wounds Perineal skin intact Periwound: intact Dressing procedure/placement/frequency:Keep skin clean and dry.  Boudreaux butt paste to gluteal folds twice daily.  Interdry Ag to perineal folds:  Measure and cut length of InterDry Ag+ to fit in skin folds that have skin breakdown Tuck InterDry  Ag+ fabric into skin folds in a single layer, allow for 2 inches of overhang from skin edges to allow for wicking to occur May remove to bathe; dry area thoroughly and then tuck into affected areas again Do not apply any creams or ointments when using InterDry Ag+ DO NOT THROW AWAY FOR 5 DAYS unless soiled with stool DO NOT [facility] product, this will inactivate the silver in the material  New sheet of Interdry Ag+ should be applied after 5 days of use if patient continues to have skin breakdown   Will not follow at this time.  Please re-consult if needed.  Maple HudsonKaren Alicja Everitt RN BSN CWON Pager [phone]

## 2017-03-16 NOTE — Progress Notes (Signed)
Pharmacy Antibiotic Note  Timothy Hays is a 83 y.o. male admitted on 03/15/2017 with UTI.  Pharmacy has been consulted for aztreonam dosing.  Plan: Patient received aztreonam 1g IV x 1 in ED  Will start aztreonam 500 mg IV q12h   Height: 5\' 11"  (180.3 cm) Weight: 206 lb (93.4 kg) IBW/kg (Calculated) : 75.3  Temp (24hrs), Avg:98.2 F (36.8 C), Min:97.7 F (36.5 C), Max:98.7 F (37.1 C)   Recent Labs Lab 03/15/17 2237  WBC 15.6*  CREATININE 1.75*    Estimated Creatinine Clearance: 29.5 mL/min (A) (by C-G formula based on SCr of 1.75 mg/dL (H)).    Allergies  Allergen Reactions  . Fish Allergy Anaphylaxis  . Keflex [Cephalexin] Nausea And Vomiting    Daughter advises that his vomiting is extremely "voilent and severe".  . Morphine And Related Nausea And Vomiting  . Penicillins     Has patient had a PCN reaction causing immediate rash, facial/tongue/throat swelling,} If all of the above answers are "NO", then may proceed with Cephalosporin use. SOB or lightheadedness with hypotension: No Has patient had a PCN reaction causing severe rash involving mucus membranes or skin necrosis: Unknown Has patient had a PCN reaction that required hospitalization Unknown Has patient had a PCN reaction occurring within the last 10 years: No      Thank you for allowing pharmacy to be a part of this patient's care.  Thomasene Rippleavid Jrue Yambao, PharmD, BCPS Clinical Pharmacist 03/16/2017

## 2017-03-16 NOTE — ED Notes (Signed)
PHarm called

## 2017-03-16 NOTE — Clinical Social Work Note (Addendum)
Clinical Social Work Assessment  Patient Details  Name: Timothy Hays MRN: [id number] Date of Birth: [birth year]  Date of referral:  03/16/17               Reason for consult:  Other (Comment Required) (From [facility] followed by [place]. )                Permission sought to share information with:  Facility Art therapist granted to share information::  Yes, Verbal Permission Granted  Name::      [facility]   Agency::     Relationship::     Contact Information:     Housing/Transportation Living arrangements for the past 2 months:  [place] of Information:  Patient, Power of Attorney, Adult Children, Facility Patient Interpreter Needed:  None Criminal Activity/Legal Involvement Pertinent to Current Situation/Hospitalization:  No - Comment as needed Significant Relationships:  Adult Children Lives with:  Facility Resident Do you feel safe going back to the place where you live?  Yes Need for family participation in patient care:  Yes (Comment)  Care giving concerns:  [name] a resident at [facility] (fax: [phone]).    Social Worker assessment / plan:  Holiday representative (CSW) reviewed chart and noted that patient is from [facility]. CSW contacted Vaughan Basta med tech at [facility] to get information about patient's baseline. Per Vaughan Basta patient has been at [facility] since 11/24/16 and is on 2 liters of oxygen at baseline. Per Vaughan Basta patient is wheel chair bound and doesn't walk. Vaughan Basta reported that staff have to use a hoyer lift to ger patient up. CSW also contacted [facility]. Per Horris Latino patient is followed by [place] hosp[facility] and can return when stable. Per Horris Latino patient has a hospital bed and oxygen provided by hospice. [facility] Glad hospice liaison is aware of above.   CSW met with patient alone at bedside. Patient was alert and oriented X3 and is agreeable to return to [facility].  CSW contacted patient's daughter/ HPOA Pam. Per daughter she is patient's HPOA and only adult child. Per Pam patient's wife has passed away. Patient's son in law Coralyn Mark is also a strong support. Daughter is agreeable for patient to return to [facility] via EMS. CSW will continue to follow and assist as needed.   Employment status:  Disabled (Comment on whether or not currently receiving Disability), Retired Forensic scientist:  Medicare PT Recommendations:  Not assessed at this time Information / Referral to community resources:  Other (Comment Required) (Patient will return back to ALF with hospice. )  Patient/Family's Response to care:  Patient and his daughter are agreeable for patient to return to [facility].   Patient/Family's Understanding of and Emotional Response to Diagnosis, Current Treatment, and Prognosis:  Patient and his daughter were very pleasant and thanked CSW for assistance.   Emotional Assessment Appearance:  Appears stated age Attitude/Demeanor/Rapport:    Affect (typically observed):  Pleasant Orientation:  Oriented to Self, Oriented to Place, Oriented to Situation, Fluctuating Orientation (Suspected and/or reported Sundowners) Alcohol / Substance use:  Not Applicable Psych involvement (Current and /or in the community):  No (Comment)  Discharge Needs  Concerns to be addressed:  Discharge Planning Concerns Readmission within the last 30 days:  No Current discharge risk:  Dependent with Mobility, Chronically ill Barriers to Discharge:  Continued Medical Work up   [employer], Veronia Beets, LCSW 03/16/2017, 2:34 PM

## 2017-03-16 NOTE — [place] FL2 (Signed)
[place] MEDICAID FL2 LEVEL OF CARE SCREENING TOOL     IDENTIFICATION  Patient Name: Riley CellarOscar T Adams Birthdate: 09/21/1918 Sex: male Admission Date (Current Location): 03/15/2017  [facility] [place]   Facility and Address:  [facility], [address], [place], [place] [address]      Provider Number: [id number]  Attending Physician Name and Address:  Alford HighlandWieting, Richard, MD  Relative Name and Phone Number:       Current Level of Care: Hospital Recommended Level of Care: Assisted Living Facility Prior Approval Number:    Date Approved/Denied:   PASRR Number:  ([phone] )  Discharge Plan: Domiciliary (Rest home)    Current Diagnoses: Patient Active Problem List   Diagnosis Date Noted  . Hyperglycemia 03/16/2017  . Pressure injury of skin 03/16/2017  . Acute CHF (HCC) 12/18/2016  . Acute CHF (congestive heart failure) (HCC) 12/18/2016  . Sepsis (HCC) 09/02/2016    Orientation RESPIRATION BLADDER Height & Weight     Self, Situation, Place  O2 (2 Liters Oxygen ) Incontinent Weight: 206 lb (93.4 kg) Height:  5\' 11"  (180.3 cm)  BEHAVIORAL SYMPTOMS/MOOD NEUROLOGICAL BOWEL NUTRITION STATUS   (none )  (none) Continent Diet (Diet: Carb Modified )  AMBULATORY STATUS COMMUNICATION OF NEEDS Skin   Extensive Assist Verbally PU Stage and Appropriate Care (Pressure Ulcer Stage 2 on sacrum. )                       Personal Care Assistance Level of Assistance  Bathing, Feeding, Dressing Bathing Assistance: Limited assistance Feeding assistance: Independent Dressing Assistance: Limited assistance     Functional Limitations Info  Sight, Hearing, Speech Sight Info: Adequate Hearing Info: Adequate Speech Info: Adequate    SPECIAL CARE FACTORS FREQUENCY   ([name] followed by [facility]. )                    Contractures      Additional Factors Info  Code Status, Allergies Code Status Info:  (DNR  ) Allergies Info:  (Fish Allergy, Keflex Cephalexin, Morphine And Related, Penicillins)           Discharge Medications: Please see discharge summary for a list of discharge medications. Current Discharge Medication List        START taking these medications   Details  ciprofloxacin (CIPRO) 500 MG tablet Take 1 tablet (500 mg total) by mouth 2 (two) times daily. Qty: 10 tablet, Refills: 0    insulin aspart (NOVOLOG) 100 UNIT/ML injection 4 units subcutaneous injection prior to meals Qty: 10 mL, Refills: 0    insulin glargine (LANTUS) 100 UNIT/ML injection Inject 0.18 mLs (18 Units total) into the skin daily. Qty: 10 mL, Refills: 0    liver oil-zinc oxide (DESITIN) 40 % ointment Apply bis to buttocks Qty: 56.7 g, Refills: 0    nystatin (MYCOSTATIN/NYSTOP) powder Apply tid to groin bilaterally Qty: 15 g, Refills: 0          CONTINUE these medications which have CHANGED   Details  furosemide (LASIX) 20 MG tablet Take 2 tablets (40 mg total) by mouth daily.          CONTINUE these medications which have NOT CHANGED   Details  acetaminophen (TYLENOL) 500 MG tablet Take 500 mg by mouth every 4 (four) hours as needed for mild pain.     albuterol (PROVENTIL HFA;VENTOLIN HFA) 108 (90 Base) MCG/ACT inhaler Inhale 2 puffs into  the lungs every 4 (four) hours as needed for wheezing or shortness of breath.    budesonide (PULMICORT) 0.5 MG/2ML nebulizer solution Take 2 mLs (0.5 mg total) by nebulization 2 (two) times daily. Qty: 100 mL, Refills: 12    docusate sodium (COLACE) 100 MG capsule Take 100 mg by mouth at bedtime.    ferrous sulfate 325 (65 FE) MG tablet Take 325 mg by mouth at bedtime.    gabapentin (NEURONTIN) 100 MG capsule Take 200-300 mg by mouth 3 (three) times daily. Take 200mg  at 0900 and 1200. Take 300mg  at bedtime.    guaiFENesin-dextromethorphan (ROBITUSSIN DM) 100-10 MG/5ML syrup Take 10 mLs by mouth every 4 (four) hours as needed for cough.     ipratropium-albuterol (DUONEB) 0.5-2.5 (3) MG/3ML SOLN Take 3 mLs by nebulization every 4 (four) hours. Qty: 360 mL, Refills: 1    latanoprost (XALATAN) 0.005 % ophthalmic solution Place 1 drop into the right eye at bedtime.    metoprolol tartrate (LOPRESSOR) 25 MG tablet Take 0.5 tablets (12.5 mg total) by mouth 2 (two) times daily. Qty: 60 tablet, Refills: 0    nitroGLYCERIN (NITROSTAT) 0.4 MG SL tablet Place 0.4 mg under the tongue every 5 (five) minutes as needed for chest pain.    ondansetron (ZOFRAN) 4 MG tablet Take 4 mg by mouth every 6 (six) hours as needed for nausea or vomiting.    pantoprazole (PROTONIX) 40 MG tablet Take 40 mg by mouth daily.     polyethylene glycol (MIRALAX / GLYCOLAX) packet Take 17 g by mouth daily as needed for mild constipation.         STOP taking these medications     TRADJENTA 5 MG TABS tablet      predniSONE (STERAPRED UNI-PAK 21 TAB) 10 MG (21) TBPK tablet        Relevant Imaging Results:  Relevant Lab Results:   Additional Information  (SSN: [id number])  Sample, Darleen Crocker, LCSW

## 2017-03-16 NOTE — Progress Notes (Signed)
Inpatient Diabetes Program Recommendations  AACE/ADA: New Consensus Statement on Inpatient Glycemic Control (2015)  Target Ranges:  Prepandial:   less than 140 mg/dL      Peak postprandial:   less than 180 mg/dL (1-2 hours)      Critically ill patients:  140 - 180 mg/dL   Lab Results  Component Value Date   GLUCAP 284 (H) 03/16/2017   HGBA1C 6.5 (H) 09/02/2016    Review of Glycemic Control  Results for  Timothy Hays, Timothy Hays (MRN [id number]) as of 03/16/2017 12:29  Ref. Range 03/15/2017 22:37 03/16/2017 02:07 03/16/2017 04:42 03/16/2017 07:37 03/16/2017 11:42  Glucose-Capillary Latest Ref Range: 65 - 99 mg/dL 086588 (HH) 578463 (H) 469424 (H) 358 (H) 284 (H)    Diabetes history: Type 2 Outpatient Diabetes medications: Tradgenta 5mg  qday Current orders for Inpatient glycemic control: Lantus 12 units qday, Novolog 0-9 units q4h  Inpatient Diabetes Program Recommendations:  Since patient is eating, consider changing Novolog correction to 0-9 units tid.  Susette RacerJulie Ajay Strubel, RN, BA, MHA, CDE Diabetes Coordinator Inpatient Diabetes Program  [phone] (Team Pager) [phone] [facility] Office) 03/16/2017 12:31 PM

## 2017-03-17 LAB — URINE CULTURE: Special Requests: NORMAL

## 2017-03-17 LAB — BASIC METABOLIC PANEL
ANION GAP: 6 (ref 5–15)
BUN: 37 mg/dL — ABNORMAL HIGH (ref 6–20)
CHLORIDE: 99 mmol/L — AB (ref 101–111)
CO2: 29 mmol/L (ref 22–32)
Calcium: 8.6 mg/dL — ABNORMAL LOW (ref 8.9–10.3)
Creatinine, Ser: 1.33 mg/dL — ABNORMAL HIGH (ref 0.61–1.24)
GFR calc Af Amer: 51 mL/min — ABNORMAL LOW (ref >60)
GFR, EST NON AFRICAN AMERICAN: 44 mL/min — AB (ref >60)
Glucose, Bld: 287 mg/dL — ABNORMAL HIGH (ref 65–99)
Potassium: 4.3 mmol/L (ref 3.5–5.1)
Sodium: 134 mmol/L — ABNORMAL LOW (ref 135–145)

## 2017-03-17 LAB — GLUCOSE, CAPILLARY
GLUCOSE-CAPILLARY: 284 mg/dL — AB (ref 65–99)
GLUCOSE-CAPILLARY: 437 mg/dL — AB (ref 65–99)
Glucose-Capillary: 400 mg/dL — ABNORMAL HIGH (ref 65–99)

## 2017-03-17 LAB — HEMOGLOBIN A1C
Hgb A1c MFr Bld: 12.8 % — ABNORMAL HIGH (ref 4.8–5.6)
Mean Plasma Glucose: 321 mg/dL

## 2017-03-17 MED ORDER — CIPROFLOXACIN HCL 500 MG PO TABS: 500.0000 mg | ORAL_TABLET | Freq: Two times a day (BID) | ORAL | 0 refills | Status: DC

## 2017-03-17 MED ORDER — ZINC OXIDE 40 % EX OINT: TOPICAL_OINTMENT | CUTANEOUS | 0 refills | Status: AC

## 2017-03-17 MED ORDER — BISACODYL 10 MG RE SUPP
10.0000 mg | Freq: Every day | RECTAL | Status: DC | PRN
Start: 2017-03-17 — End: 2017-03-17

## 2017-03-17 MED ORDER — FUROSEMIDE 20 MG PO TABS: 40.0000 mg | ORAL_TABLET | Freq: Every day | ORAL | Status: AC

## 2017-03-17 MED ORDER — BUDESONIDE 0.5 MG/2ML IN SUSP: 0.5000 mg | Freq: Two times a day (BID) | RESPIRATORY_TRACT | Status: DC

## 2017-03-17 MED ORDER — INSULIN GLARGINE 100 UNIT/ML ~~LOC~~ SOLN
15.0000 [IU] | Freq: Every day | SUBCUTANEOUS | Status: DC
  Administered 2017-03-17: 15 [IU] via SUBCUTANEOUS
  Filled 2017-03-17: qty 0.15

## 2017-03-17 MED ORDER — DEXTROSE 5 % IV SOLN
1.0000 g | INTRAVENOUS | Status: DC
  Administered 2017-03-17: 11:00:00 1 g via INTRAVENOUS
  Filled 2017-03-17: qty 10

## 2017-03-17 MED ORDER — INSULIN GLARGINE 100 UNIT/ML ~~LOC~~ SOLN: 18.0000 [IU] | Freq: Every day | SUBCUTANEOUS | 0 refills | Status: AC

## 2017-03-17 MED ORDER — NYSTATIN 100000 UNIT/GM EX POWD: CUTANEOUS | 0 refills | Status: AC

## 2017-03-17 MED ORDER — INSULIN GLARGINE 100 UNIT/ML ~~LOC~~ SOLN: 18.0000 [IU] | Freq: Every day | SUBCUTANEOUS | Status: DC

## 2017-03-17 MED ORDER — FLEET ENEMA 7-19 GM/118ML RE ENEM: 1.0000 | ENEMA | Freq: Every day | RECTAL | Status: DC | PRN

## 2017-03-17 MED ORDER — INSULIN ASPART 100 UNIT/ML ~~LOC~~ SOLN: SUBCUTANEOUS | 0 refills | Status: AC

## 2017-03-17 NOTE — Progress Notes (Signed)
Visit made. Patient seen lying in bed, eyes closed, easily awakened by voice. He denied pain or discomfort. He continues on sliding scale insulin and will continue at discharge as well as remain on oral antibiotics for 5 days. Plan is for [facility] to [facility] hosp[facility] services. Hospice team alerted to discharge and summary faxed. No family at bedside. Family notified of discharge by CSW, Dede QuerySarah McNulty. Thank you. Dayna BarkerKaren Robertson RN, BSN, [facility] [place], hospital Liaison [phone] c

## 2017-03-17 NOTE — Progress Notes (Signed)
Inpatient Diabetes Program Recommendations  AACE/ADA: New Consensus Statement on Inpatient Glycemic Control (2015)  Target Ranges:  Prepandial:   less than 140 mg/dL      Peak postprandial:   less than 180 mg/dL (1-2 hours)      Critically ill patients:  140 - 180 mg/dL   Lab Results  Component Value Date   GLUCAP 350 (H) 03/16/2017   HGBA1C 12.8 (H) 03/16/2017    Review of Glycemic Control  Results for Chilchinbito CellarBECKOM, Timothy Hays (MRN [id number]) as of 03/17/2017 08:13  Ref. Range 03/16/2017 07:37 03/16/2017 11:42 03/16/2017 16:55 03/16/2017 20:00 03/17/2017 07:43  Glucose-Capillary Latest Ref Range: 65 - 99 mg/dL 409358 (H) 811284 (H) 914299 (H) 350 (H) 284 (H)     Diabetes history: Type 2 Outpatient Diabetes medications: Tradgenta 5mg  qday Current orders for Inpatient glycemic control: Lantus 12 units qday, Novolog 0-9 units tid, Novolog 0-5 units qhs  Inpatient Diabetes Program Recommendations:  Consider increasing Novolog correction to moderate correction scale 0-15 units tid.  Fasting blood sugar  284mg /dl  consider increasing Lantus to 19 units qday (0.2units/kg)  Susette RacerJulie Dresden Ament, RN, [place], [place], CDE Diabetes Coordinator Inpatient Diabetes Program  [phone] (Team Pager) [phone] [facility] Office) 03/17/2017 7:25 AM

## 2017-03-17 NOTE — Clinical Social Work Note (Signed)
Pt is ready for discharge today and will [facility] to [facility] will be following at the facility for hospice services at the facility. Pt's daughter is aware and agreeable to discharge plan. RN is aware. [facility] will provide [facility]. CSW is signing off as no further needs identified.   Dede QuerySarah Aleda Madl, MSW, LCSW Clinical Social Worker  [phone]

## 2017-03-17 NOTE — Care Management CC44 (Signed)
Condition Code 44 Documentation Completed  Patient Details  Name: Ga[place]and CellarOscar T Hockey MRN: [id number] Date of Birth: 10/05/1918   Condition Code 44 given:    Patient signature on Condition Code 44 notice:    Documentation of 2 MD's agreement:   yes Code 44 added to claim:   yes    Berna BueCheryl Sherilynn Dieu, RN 03/17/2017, 1:10 PM

## 2017-03-17 NOTE — Progress Notes (Signed)
[name] being [facility]. All belongings were packed and returned to the patient. Discharge instructions were reviewed with him and his facility. IVs x2 were removed. EMS arrived and transported.

## 2017-03-17 NOTE — Discharge Summary (Signed)
[employer] [facility] [facility] [place] [facility]   PATIENT NAME: Timothy Hays    MR#:  [id number]  DATE OF BIRTH:  Sep 30, 1918  DATE OF ADMISSION:  03/15/2017 ADMITTING PHYSICIAN: Tonye Royalty, DO  DATE OF DISCHARGE: 03/17/2017  PRIMARY CARE PHYSICIAN: Leanna Sato, MD    ADMISSION DIAGNOSIS:  Hyperglycemia [R73.9] Fall, initial encounter [W19.XXXA] Urinary tract infection without hematuria, site unspecified [N39.0] Acute renal failure, unspecified acute renal failure type (HCC) [N17.9] Contusion of head, unspecified part of head, initial encounter [S00.93XA]  DISCHARGE DIAGNOSIS:  Active Problems:   Hyperglycemia   Pressure injury of skin   SECONDARY DIAGNOSIS:   Past Medical History:  Diagnosis Date  . Anginal pain (HCC)   . CAD (coronary artery disease)   . CKD (chronic kidney disease), stage III   . Diabetes mellitus without complication (HCC)   . Dyspnea   . GERD (gastroesophageal reflux disease)   . History of gout   . Hyperlipidemia   . Hypertension   . Myocardial infarction (HCC)   . Peripheral vascular disease (HCC)     HOSPITAL COURSE:   1. Uncontrolled type 2 diabetes mellitus. Patient came in with very high sugars. The patient was started on sliding scale and Lantus insulin. Hemoglobin A1c elevated at 12.8. This correlates to an average sugar of about 320 over the last 12 weeks. I explained to the patient and family that Glucophage will not handle this type of coverage. The patient will be best served with insulin at this point. His Lantus insulin titrated up to 18 units subcutaneous injection in the morning. Short acting insulin will be given prior to meals. Sugars will have to be checked every before meals and daily at bedtime. 2. Acute cystitis with out hematuria. The patient's antibiotics were switched to Rocephin here. Since the patient does have an allergy to Keflex he'll be switched over to Cipro for another 5 days starting tomorrow 3. Acute  kidney injury on chronic kidney disease stage III. Creatinine improved from admission. Can continue his Lasix as outpatient but monitor closely 4. History of coronary artery disease, myocardial infarction peripheral vascular disease on metoprolol 5. GERD on Protonix 6. Fungal infection on groin on nystatin powder 7. Essential hypertension and tachycardia on metoprolol 8. Stage II decubitus on buttocks present on admission. Need to be covered and monitored. 9. Follows with hospice at facility. Has oxygen supplementation 10. I will upper airway wheeze. Continue nebulizer treatments 11. Chronic anemia continue to watch as outpatient  DISCHARGE CONDITIONS:   Fair  CONSULTS OBTAINED:   none  DRUG ALLERGIES:   Allergies  Allergen Reactions  . Fish Allergy Anaphylaxis  . Keflex [Cephalexin] Nausea And Vomiting    Daughter advises that his vomiting is extremely "voilent and severe".  . Morphine And Related Nausea And Vomiting  . Penicillins     Has patient had a PCN reaction causing immediate rash, facial/tongue/throat swelling,} If all of the above answers are "NO", then may proceed with Cephalosporin use. SOB or lightheadedness with hypotension: No Has patient had a PCN reaction causing severe rash involving mucus membranes or skin necrosis: Unknown Has patient had a PCN reaction that required hospitalization Unknown Has patient had a PCN reaction occurring within the last 10 years: No     DISCHARGE MEDICATIONS:   Current Discharge Medication List    START taking these medications   Details  ciprofloxacin (CIPRO) 500 MG tablet Take 1 tablet (500 mg total) by mouth 2 (two) times daily. Qty:  10 tablet, Refills: 0    insulin aspart (NOVOLOG) 100 UNIT/ML injection 4 units subcutaneous injection prior to meals Qty: 10 mL, Refills: 0    insulin glargine (LANTUS) 100 UNIT/ML injection Inject 0.18 mLs (18 Units total) into the skin daily. Qty: 10 mL, Refills: 0    liver oil-zinc  oxide (DESITIN) 40 % ointment Apply bis to buttocks Qty: 56.7 g, Refills: 0    nystatin (MYCOSTATIN/NYSTOP) powder Apply tid to groin bilaterally Qty: 15 g, Refills: 0      CONTINUE these medications which have CHANGED   Details  furosemide (LASIX) 20 MG tablet Take 2 tablets (40 mg total) by mouth daily.      CONTINUE these medications which have NOT CHANGED   Details  acetaminophen (TYLENOL) 500 MG tablet Take 500 mg by mouth every 4 (four) hours as needed for mild pain.     albuterol (PROVENTIL HFA;VENTOLIN HFA) 108 (90 Base) MCG/ACT inhaler Inhale 2 puffs into the lungs every 4 (four) hours as needed for wheezing or shortness of breath.    budesonide (PULMICORT) 0.5 MG/2ML nebulizer solution Take 2 mLs (0.5 mg total) by nebulization 2 (two) times daily. Qty: 100 mL, Refills: 12    docusate sodium (COLACE) 100 MG capsule Take 100 mg by mouth at bedtime.    ferrous sulfate 325 (65 FE) MG tablet Take 325 mg by mouth at bedtime.    gabapentin (NEURONTIN) 100 MG capsule Take 200-300 mg by mouth 3 (three) times daily. Take 200mg  at 0900 and 1200. Take 300mg  at bedtime.    guaiFENesin-dextromethorphan (ROBITUSSIN DM) 100-10 MG/5ML syrup Take 10 mLs by mouth every 4 (four) hours as needed for cough.    ipratropium-albuterol (DUONEB) 0.5-2.5 (3) MG/3ML SOLN Take 3 mLs by nebulization every 4 (four) hours. Qty: 360 mL, Refills: 1    latanoprost (XALATAN) 0.005 % ophthalmic solution Place 1 drop into the right eye at bedtime.    metoprolol tartrate (LOPRESSOR) 25 MG tablet Take 0.5 tablets (12.5 mg total) by mouth 2 (two) times daily. Qty: 60 tablet, Refills: 0    nitroGLYCERIN (NITROSTAT) 0.4 MG SL tablet Place 0.4 mg under the tongue every 5 (five) minutes as needed for chest pain.    ondansetron (ZOFRAN) 4 MG tablet Take 4 mg by mouth every 6 (six) hours as needed for nausea or vomiting.    pantoprazole (PROTONIX) 40 MG tablet Take 40 mg by mouth daily.     polyethylene glycol  (MIRALAX / GLYCOLAX) packet Take 17 g by mouth daily as needed for mild constipation.      STOP taking these medications     TRADJENTA 5 MG TABS tablet      predniSONE (STERAPRED UNI-PAK 21 TAB) 10 MG (21) TBPK tablet          DISCHARGE INSTRUCTIONS:   Follow-up with medical doctor one week Follow-up with hospice at facility  If you experience worsening of your admission symptoms, develop shortness of breath, life threatening emergency, suicidal or homicidal thoughts you must seek medical attention immediately by calling 911 or calling your MD immediately  if symptoms less severe.  You Must read complete instructions/literature along with all the possible adverse reactions/side effects for all the Medicines you take and that have been prescribed to you. Take any new Medicines after you have completely understood and accept all the possible adverse reactions/side effects.   Please note  You were cared for by a hospitalist during your hospital stay. If you have any questions about  your discharge medications or the care you received while you were in the hospital after you are discharged, you can call the unit and asked to speak with the hospitalist on call if the hospitalist that took care of you is not available. Once you are discharged, your primary care physician will handle any further medical issues. Please note that NO REFILLS for any discharge medications will be authorized once you are discharged, as it is imperative that you return to your primary care physician (or establish a relationship with a primary care physician if you do not have one) for your aftercare needs so that they can reassess your need for medications and monitor your lab values.    Today   CHIEF COMPLAINT:   Chief Complaint  Patient presents with  . Hyperglycemia    HISTORY OF PRESENT ILLNESS:  Timothy Hays  is a 83 y.o. male sent in for high sugars   VITAL SIGNS:  Blood pressure 129/78, pulse (!)  119, temperature 98.3 F (36.8 C), temperature source Oral, resp. rate 20, height 5\' 11"  (1.803 m), weight 93.4 kg (206 lb), SpO2 98 %. Heart rate taken before metoprolol given, previous heart rates in the 70s and 80s   PHYSICAL EXAMINATION:  GENERAL:  83 y.o.-year-old patient lying in the bed with no acute distress.  EYES: Pupils equal, round, reactive to light and accommodation. No scleral icterus. Extraocular muscles intact.  HEENT: Head atraumatic, normocephalic. Oropharynx and nasopharynx clear.  NECK:  Supple, no jugular venous distention. No thyroid enlargement, no tenderness.  LUNGS: Normal breath sounds bilaterally, Slight upper airway wheezing which improved after coughing. No rales,rhonchi or crepitation. No use of accessory muscles of respiration.  CARDIOVASCULAR: S1, S2 normal. No murmurs, rubs, or gallops.  ABDOMEN: Soft, non-tender, non-distended. Bowel sounds present. No organomegaly or mass.  EXTREMITIES: 3+ edema, no cyanosis, or clubbing.  NEUROLOGIC: Cranial nerves II through XII are intact. Muscle strength 5/5 in all extremities. Sensation intact. Gait not checked.  PSYCHIATRIC: The patient is alert and oriented x 3.  SKIN: 2 stage II decubitus ulcers on her buttock. Bilateral groin erythema  DATA REVIEW:   CBC  Recent Labs Lab 03/16/17 0538  WBC 13.0*  HGB 9.3*  HCT 29.7*  PLT 180    Chemistries   Recent Labs Lab 03/15/17 2237  03/17/17 0436  NA 126*  < > 134*  K 5.1  < > 4.3  CL 90*  < > 99*  CO2 28  < > 29  GLUCOSE 593*  < > 287*  BUN 44*  < > 37*  CREATININE 1.75*  < > 1.33*  CALCIUM 8.7*  < > 8.6*  AST 15  --   --   ALT 9*  --   --   ALKPHOS 64  --   --   BILITOT 0.5  --   --   < > = values in this interval not displayed.  Cardiac Enzymes  Recent Labs Lab 03/15/17 2237  TROPONINI <0.03    Microbiology Results  Results for orders placed or performed during the hospital encounter of 03/15/17  Urine Culture     Status: Abnormal    Collection Time: 03/16/17 12:27 AM  Result Value Ref Range Status   Specimen Description URINE, RANDOM  Final   Special Requests Normal  Final   Culture MULTIPLE ORGANISMS PRESENT, NONE PREDOMINANT (A)  Final   Report Status 03/17/2017 FINAL  Final  MRSA PCR Screening     Status: None  Collection Time: 03/16/17  5:22 AM  Result Value Ref Range Status   MRSA by PCR NEGATIVE NEGATIVE Final    Comment:        The GeneXpert MRSA Assay (FDA approved for NASAL specimens only), is one component of a comprehensive MRSA colonization surveillance program. It is not intended to diagnose MRSA infection nor to guide or monitor treatment for MRSA infections.     RADIOLOGY:  Ct Head Wo Contrast  Result Date: 03/16/2017 CLINICAL DATA:  Initial evaluation for acute fall, forehead trauma. EXAM: CT HEAD WITHOUT CONTRAST TECHNIQUE: Contiguous axial images were obtained from the base of the skull through the vertex without intravenous contrast. COMPARISON:  Prior CT from 03/14/2013. FINDINGS: Brain: Mild age-related cerebral atrophy with chronic small vessel ischemic disease. Few scattered remote cerebellar infarcts noted. Additional probable tiny remote lacunar infarct within the left thalamus. No acute intracranial hemorrhage. No evidence for acute large vessel territory infarct. No mass lesion, midline shift or mass effect. No hydrocephalus. No extra-axial fluid collection. Vascular: No hyperdense vessel. Scattered vascular calcifications noted within the carotid siphons. Skull: Scalp soft tissues demonstrate no acute abnormality. Calvarium intact. Sinuses/Orbits: Globes and orbital soft tissues demonstrate no acute abnormality. Postsurgical changes noted at the right globe. Patient status post lens extraction bilaterally. Scattered mucosal thickening noted throughout the paranasal sinuses. Small chronic left mastoid effusion. Right mastoid air cells clear. IMPRESSION: 1. No acute intracranial process  identified. 2. Mild age-related cerebral atrophy with chronic small vessel ischemic disease, with few small scattered remote infarcts as above. Electronically Signed   By: Rise Mu M.D.   On: 03/16/2017 00:55   Dg Chest Portable 1 View  Result Date: 03/16/2017 CLINICAL DATA:  Initial evaluation for acute shortness of breath. EXAM: PORTABLE CHEST 1 VIEW COMPARISON:  Prior radiograph from 12/22/2016. FINDINGS: Moderate cardiomegaly, stable. Mediastinal silhouette within normal limits. Aortic atherosclerosis. Lungs hypoinflated. Diffuse pulmonary vascular congestion with interstitial prominence, consistent with pulmonary edema. No definite focal infiltrates. No obvious pleural effusion. No pneumothorax. No acute osseus abnormality. IMPRESSION: 1. Stable cardiomegaly with mild to moderate pulmonary interstitial edema. 2. Aortic atherosclerosis. Electronically Signed   By: Rise Mu M.D.   On: 03/16/2017 00:46    Management plans discussed with the patient, family and they are in agreement.  CODE STATUS:     Code Status Orders        Start     Ordered   03/16/17 0408  Do not attempt resuscitation (DNR)  Continuous    Question Answer Comment  In the event of cardiac or respiratory ARREST Do not call a "code blue"   In the event of cardiac or respiratory ARREST Do not perform Intubation, CPR, defibrillation or ACLS   In the event of cardiac or respiratory ARREST Use medication by any route, position, wound care, and other measures to relive pain and suffering. May use oxygen, suction and manual treatment of airway obstruction as needed for comfort.      03/16/17 0407    Code Status History    Date Active Date Inactive Code Status Order ID Comments User Context   03/16/2017  3:08 AM 03/16/2017  4:07 AM Full Code [id number]  Hugelmeyer, Jon Gills, DO Inpatient   03/16/2017  2:00 AM 03/16/2017  3:08 AM DNR [id number]  Tonye Royalty, DO ED   12/18/2016 12:49 PM 12/23/2016  7:52 PM DNR  [id number]  Altamese Dilling, MD Inpatient   12/18/2016  7:20 AM 12/18/2016 12:49 PM DNR [id number]  Loleta Rose, MD  ED   09/02/2016  3:16 PM 09/03/2016 10:28 PM DNR [id number]  Wyatt HasteHower, David K, MD ED   09/02/2016  3:00 PM 09/02/2016  3:15 PM Full Code [id number]  Hower, Cletis Athensavid K, MD ED    Advance Directive Documentation     Most Recent Value  Type of Advance Directive  Out of facility DNR (pink MOST or yellow form)  Pre-existing out of facility DNR order (yellow form or pink MOST form)  Yellow form placed in chart (order not valid for inpatient use)  "MOST" Form in Place?  -      TOTAL TIME TAKING CARE OF THIS PATIENT: 32 minutes.    Alford HighlandWIETING, Romaine Maciolek M.D on 03/17/2017 at 9:22 AM  Between 7am to 6pm - Pager - [phone]  After 6pm go to [email or web address] - password [email or web address] [email or web address]  [facility] Office  [phone]  CC: Primary care physician; Leanna SatoMiles, Linda M, MD

## 2017-03-17 NOTE — Discharge Instructions (Signed)

## 2017-07-12 ENCOUNTER — Emergency Department

## 2017-07-12 ENCOUNTER — Inpatient Hospital Stay
Admission: EM | Admit: 2017-07-12 | Discharge: 2017-07-16 | DRG: 871 | Disposition: A | Discharge status: Hospice | Attending: Internal Medicine | Admitting: Internal Medicine

## 2017-07-12 ENCOUNTER — Other Ambulatory Visit: Payer: Self-pay

## 2017-07-12 ENCOUNTER — Encounter: Payer: Self-pay | Admitting: Emergency Medicine

## 2017-07-12 DIAGNOSIS — G9341 Metabolic encephalopathy: Secondary | ICD-10-CM | POA: Diagnosis present

## 2017-07-12 DIAGNOSIS — L89153 Pressure ulcer of sacral region, stage 3: Secondary | ICD-10-CM | POA: Diagnosis present

## 2017-07-12 DIAGNOSIS — E669 Obesity, unspecified: Secondary | ICD-10-CM | POA: Diagnosis present

## 2017-07-12 DIAGNOSIS — N390 Urinary tract infection, site not specified: Secondary | ICD-10-CM | POA: Diagnosis present

## 2017-07-12 DIAGNOSIS — Z6832 Body mass index (BMI) 32.0-32.9, adult: Secondary | ICD-10-CM | POA: Diagnosis not present

## 2017-07-12 DIAGNOSIS — F0390 Unspecified dementia without behavioral disturbance: Secondary | ICD-10-CM | POA: Diagnosis present

## 2017-07-12 DIAGNOSIS — R21 Rash and other nonspecific skin eruption: Secondary | ICD-10-CM | POA: Diagnosis present

## 2017-07-12 DIAGNOSIS — E1122 Type 2 diabetes mellitus with diabetic chronic kidney disease: Secondary | ICD-10-CM | POA: Diagnosis present

## 2017-07-12 DIAGNOSIS — K219 Gastro-esophageal reflux disease without esophagitis: Secondary | ICD-10-CM | POA: Diagnosis present

## 2017-07-12 DIAGNOSIS — A419 Sepsis, unspecified organism: Principal | ICD-10-CM | POA: Diagnosis present

## 2017-07-12 DIAGNOSIS — I2510 Atherosclerotic heart disease of native coronary artery without angina pectoris: Secondary | ICD-10-CM | POA: Diagnosis present

## 2017-07-12 DIAGNOSIS — Z66 Do not resuscitate: Secondary | ICD-10-CM | POA: Diagnosis present

## 2017-07-12 DIAGNOSIS — Z881 Allergy status to other antibiotic agents status: Secondary | ICD-10-CM

## 2017-07-12 DIAGNOSIS — Z794 Long term (current) use of insulin: Secondary | ICD-10-CM

## 2017-07-12 DIAGNOSIS — R32 Unspecified urinary incontinence: Secondary | ICD-10-CM | POA: Diagnosis present

## 2017-07-12 DIAGNOSIS — Z7951 Long term (current) use of inhaled steroids: Secondary | ICD-10-CM

## 2017-07-12 DIAGNOSIS — M109 Gout, unspecified: Secondary | ICD-10-CM | POA: Diagnosis present

## 2017-07-12 DIAGNOSIS — Z87440 Personal history of urinary (tract) infections: Secondary | ICD-10-CM | POA: Diagnosis not present

## 2017-07-12 DIAGNOSIS — R4182 Altered mental status, unspecified: Secondary | ICD-10-CM | POA: Diagnosis present

## 2017-07-12 DIAGNOSIS — Z885 Allergy status to narcotic agent status: Secondary | ICD-10-CM

## 2017-07-12 DIAGNOSIS — E1151 Type 2 diabetes mellitus with diabetic peripheral angiopathy without gangrene: Secondary | ICD-10-CM | POA: Diagnosis present

## 2017-07-12 DIAGNOSIS — Z91013 Allergy to seafood: Secondary | ICD-10-CM

## 2017-07-12 DIAGNOSIS — Z515 Encounter for palliative care: Secondary | ICD-10-CM | POA: Diagnosis present

## 2017-07-12 DIAGNOSIS — Z880 Allergy status to penicillin: Secondary | ICD-10-CM

## 2017-07-12 DIAGNOSIS — E785 Hyperlipidemia, unspecified: Secondary | ICD-10-CM | POA: Diagnosis present

## 2017-07-12 DIAGNOSIS — N183 Chronic kidney disease, stage 3 (moderate): Secondary | ICD-10-CM | POA: Diagnosis present

## 2017-07-12 DIAGNOSIS — I129 Hypertensive chronic kidney disease with stage 1 through stage 4 chronic kidney disease, or unspecified chronic kidney disease: Secondary | ICD-10-CM | POA: Diagnosis present

## 2017-07-12 DIAGNOSIS — R400 Somnolence: Secondary | ICD-10-CM

## 2017-07-12 DIAGNOSIS — Z8249 Family history of ischemic heart disease and other diseases of the circulatory system: Secondary | ICD-10-CM

## 2017-07-12 DIAGNOSIS — I252 Old myocardial infarction: Secondary | ICD-10-CM

## 2017-07-12 DIAGNOSIS — N471 Phimosis: Secondary | ICD-10-CM | POA: Diagnosis present

## 2017-07-12 DIAGNOSIS — Z79899 Other long term (current) drug therapy: Secondary | ICD-10-CM

## 2017-07-12 LAB — CBC
HEMATOCRIT: 35 % — AB (ref 40.0–52.0)
Hemoglobin: 11 g/dL — ABNORMAL LOW (ref 13.0–18.0)
MCH: 27.6 pg (ref 26.0–34.0)
MCHC: 31.4 g/dL — ABNORMAL LOW (ref 32.0–36.0)
MCV: 87.8 fL (ref 80.0–100.0)
PLATELETS: 226 10*3/uL (ref 150–440)
RBC: 3.98 MIL/uL — AB (ref 4.40–5.90)
RDW: 18 % — ABNORMAL HIGH (ref 11.5–14.5)
WBC: 11.2 10*3/uL — ABNORMAL HIGH (ref 3.8–10.6)

## 2017-07-12 LAB — PROCALCITONIN: Procalcitonin: 0.1 ng/mL

## 2017-07-12 LAB — GLUCOSE, CAPILLARY: GLUCOSE-CAPILLARY: 292 mg/dL — AB (ref 65–99)

## 2017-07-12 LAB — URINALYSIS, COMPLETE (UACMP) WITH MICROSCOPIC
BILIRUBIN URINE: NEGATIVE
Glucose, UA: NEGATIVE mg/dL
KETONES UR: NEGATIVE mg/dL
Nitrite: NEGATIVE
PROTEIN: 30 mg/dL — AB
SPECIFIC GRAVITY, URINE: 1.012 (ref 1.005–1.030)
pH: 5 (ref 5.0–8.0)

## 2017-07-12 LAB — LACTIC ACID, PLASMA: LACTIC ACID, VENOUS: 1.8 mmol/L (ref 0.5–1.9)

## 2017-07-12 LAB — TROPONIN I: Troponin I: <0.03 ng/mL (ref <0.03)

## 2017-07-12 LAB — COMPREHENSIVE METABOLIC PANEL
ALBUMIN: 2.8 g/dL — AB (ref 3.5–5.0)
ALT: 8 U/L — AB (ref 17–63)
AST: 13 U/L — AB (ref 15–41)
Alkaline Phosphatase: 49 U/L (ref 38–126)
Anion gap: 8 (ref 5–15)
BUN: 35 mg/dL — ABNORMAL HIGH (ref 6–20)
CHLORIDE: 98 mmol/L — AB (ref 101–111)
CO2: 29 mmol/L (ref 22–32)
CREATININE: 1.38 mg/dL — AB (ref 0.61–1.24)
Calcium: 8.4 mg/dL — ABNORMAL LOW (ref 8.9–10.3)
GFR calc non Af Amer: 42 mL/min — ABNORMAL LOW (ref >60)
GFR, EST AFRICAN AMERICAN: 48 mL/min — AB (ref >60)
Glucose, Bld: 221 mg/dL — ABNORMAL HIGH (ref 65–99)
POTASSIUM: 4.1 mmol/L (ref 3.5–5.1)
SODIUM: 135 mmol/L (ref 135–145)
Total Bilirubin: 0.4 mg/dL (ref 0.3–1.2)
Total Protein: 7.1 g/dL (ref 6.5–8.1)

## 2017-07-12 LAB — DIFFERENTIAL
BASOS ABS: 0 10*3/uL (ref 0–0.1)
Basophils Relative: 0 %
EOS ABS: 0.8 10*3/uL — AB (ref 0–0.7)
Eosinophils Relative: 7 %
LYMPHS ABS: 0.8 10*3/uL — AB (ref 1.0–3.6)
LYMPHS PCT: 7 %
Monocytes Absolute: 0.9 10*3/uL (ref 0.2–1.0)
Monocytes Relative: 8 %
NEUTROS PCT: 78 %
Neutro Abs: 9.3 10*3/uL — ABNORMAL HIGH (ref 1.4–6.5)

## 2017-07-12 LAB — PROTIME-INR
INR: 1.08
Prothrombin Time: 13.9 seconds (ref 11.4–15.2)

## 2017-07-12 LAB — MRSA PCR SCREENING: MRSA BY PCR: NEGATIVE

## 2017-07-12 LAB — BRAIN NATRIURETIC PEPTIDE: B Natriuretic Peptide: 337 pg/mL — ABNORMAL HIGH (ref 0.0–100.0)

## 2017-07-12 LAB — LIPASE, BLOOD: LIPASE: 13 U/L (ref 11–51)

## 2017-07-12 MED ORDER — INSULIN ASPART 100 UNIT/ML ~~LOC~~ SOLN
4.0000 [IU] | Freq: Three times a day (TID) | SUBCUTANEOUS | Status: DC
  Administered 2017-07-13 – 2017-07-16 (×11): 4 [IU] via SUBCUTANEOUS
  Filled 2017-07-12 (×11): qty 1

## 2017-07-12 MED ORDER — ACETAMINOPHEN 325 MG PO TABS: 650.0000 mg | ORAL_TABLET | Freq: Four times a day (QID) | ORAL | Status: DC | PRN

## 2017-07-12 MED ORDER — GUAIFENESIN-DM 100-10 MG/5ML PO SYRP
10.0000 mL | ORAL_SOLUTION | ORAL | Status: DC | PRN
  Filled 2017-07-12: qty 10

## 2017-07-12 MED ORDER — SENNOSIDES-DOCUSATE SODIUM 8.6-50 MG PO TABS: 1.0000 | ORAL_TABLET | Freq: Every evening | ORAL | Status: DC | PRN

## 2017-07-12 MED ORDER — VANCOMYCIN HCL IN DEXTROSE 1-5 GM/200ML-% IV SOLN
1000.0000 mg | Freq: Once | INTRAVENOUS | Status: AC
  Administered 2017-07-12: 1000 mg via INTRAVENOUS
  Filled 2017-07-12: qty 200

## 2017-07-12 MED ORDER — BUDESONIDE 0.5 MG/2ML IN SUSP
0.5000 mg | Freq: Two times a day (BID) | RESPIRATORY_TRACT | Status: DC
  Administered 2017-07-13 – 2017-07-16 (×7): 0.5 mg via RESPIRATORY_TRACT
  Filled 2017-07-12 (×8): qty 2

## 2017-07-12 MED ORDER — GABAPENTIN 100 MG PO CAPS
200.0000 mg | ORAL_CAPSULE | Freq: Three times a day (TID) | ORAL | Status: DC
  Administered 2017-07-13 (×3): 300 mg via ORAL
  Administered 2017-07-14 (×2): 200 mg via ORAL
  Administered 2017-07-14 – 2017-07-15 (×2): 300 mg via ORAL
  Administered 2017-07-15 – 2017-07-16 (×3): 200 mg via ORAL
  Filled 2017-07-12: qty 2
  Filled 2017-07-12: qty 3
  Filled 2017-07-12 (×2): qty 2
  Filled 2017-07-12 (×3): qty 3
  Filled 2017-07-12 (×2): qty 2
  Filled 2017-07-12: qty 3

## 2017-07-12 MED ORDER — DOCUSATE SODIUM 100 MG PO CAPS
100.0000 mg | ORAL_CAPSULE | Freq: Every day | ORAL | Status: DC
  Administered 2017-07-13 – 2017-07-15 (×3): 100 mg via ORAL
  Filled 2017-07-12 (×3): qty 1

## 2017-07-12 MED ORDER — DEXTROSE 5 % IV SOLN
1.0000 g | Freq: Three times a day (TID) | INTRAVENOUS | Status: DC
  Administered 2017-07-12 – 2017-07-13 (×3): 1 g via INTRAVENOUS
  Filled 2017-07-12 (×4): qty 1

## 2017-07-12 MED ORDER — DIPHENHYDRAMINE HCL 25 MG PO CAPS: 25.0000 mg | ORAL_CAPSULE | Freq: Four times a day (QID) | ORAL | Status: DC | PRN

## 2017-07-12 MED ORDER — SODIUM CHLORIDE 0.9 % IV SOLN
2.0000 g | Freq: Once | INTRAVENOUS | Status: AC
  Administered 2017-07-12: 2 g via INTRAVENOUS
  Filled 2017-07-12: qty 2

## 2017-07-12 MED ORDER — FERROUS SULFATE 325 (65 FE) MG PO TABS
325.0000 mg | ORAL_TABLET | Freq: Every day | ORAL | Status: DC
  Administered 2017-07-13 – 2017-07-15 (×3): 325 mg via ORAL
  Filled 2017-07-12 (×3): qty 1

## 2017-07-12 MED ORDER — DEXAMETHASONE SODIUM PHOSPHATE 10 MG/ML IJ SOLN
10.0000 mg | Freq: Once | INTRAMUSCULAR | Status: AC
  Administered 2017-07-12: 10 mg via INTRAVENOUS
  Filled 2017-07-12: qty 1

## 2017-07-12 MED ORDER — BISACODYL 5 MG PO TBEC: 5.0000 mg | DELAYED_RELEASE_TABLET | Freq: Every day | ORAL | Status: DC | PRN

## 2017-07-12 MED ORDER — SODIUM CHLORIDE 0.9 % IV BOLUS (SEPSIS)
500.0000 mL | Freq: Once | INTRAVENOUS | Status: AC
  Administered 2017-07-12: 500 mL via INTRAVENOUS

## 2017-07-12 MED ORDER — PANTOPRAZOLE SODIUM 40 MG PO TBEC
40.0000 mg | DELAYED_RELEASE_TABLET | Freq: Every day | ORAL | Status: DC
  Administered 2017-07-13 – 2017-07-16 (×4): 40 mg via ORAL
  Filled 2017-07-12 (×4): qty 1

## 2017-07-12 MED ORDER — LATANOPROST 0.005 % OP SOLN
1.0000 [drp] | Freq: Every day | OPHTHALMIC | Status: DC
  Administered 2017-07-13 – 2017-07-15 (×3): 1 [drp] via OPHTHALMIC
  Filled 2017-07-12 (×2): qty 2.5

## 2017-07-12 MED ORDER — SODIUM CHLORIDE 0.9 % IV SOLN: 2.0000 g | Freq: Two times a day (BID) | INTRAVENOUS | Status: DC

## 2017-07-12 MED ORDER — ACETAMINOPHEN 650 MG RE SUPP: 650.0000 mg | Freq: Four times a day (QID) | RECTAL | Status: DC | PRN

## 2017-07-12 MED ORDER — VANCOMYCIN HCL 10 G IV SOLR: 1250.0000 mg | INTRAVENOUS | Status: DC

## 2017-07-12 MED ORDER — INSULIN GLARGINE 100 UNIT/ML ~~LOC~~ SOLN
18.0000 [IU] | Freq: Every day | SUBCUTANEOUS | Status: DC
  Administered 2017-07-13 – 2017-07-16 (×4): 18 [IU] via SUBCUTANEOUS
  Filled 2017-07-12 (×6): qty 0.18

## 2017-07-12 MED ORDER — POLYETHYLENE GLYCOL 3350 17 G PO PACK: 17.0000 g | PACK | Freq: Every day | ORAL | Status: DC | PRN

## 2017-07-12 MED ORDER — ONDANSETRON HCL 4 MG PO TABS: 4.0000 mg | ORAL_TABLET | Freq: Four times a day (QID) | ORAL | Status: DC | PRN

## 2017-07-12 MED ORDER — METOPROLOL TARTRATE 25 MG PO TABS
12.5000 mg | ORAL_TABLET | Freq: Two times a day (BID) | ORAL | Status: DC
  Administered 2017-07-13 – 2017-07-16 (×7): 12.5 mg via ORAL
  Filled 2017-07-12 (×7): qty 1

## 2017-07-12 MED ORDER — LEVOFLOXACIN IN D5W 750 MG/150ML IV SOLN
750.0000 mg | INTRAVENOUS | Status: DC
  Administered 2017-07-12: 750 mg via INTRAVENOUS
  Filled 2017-07-12 (×2): qty 150

## 2017-07-12 MED ORDER — NITROGLYCERIN 0.4 MG SL SUBL: 0.4000 mg | SUBLINGUAL_TABLET | SUBLINGUAL | Status: DC | PRN

## 2017-07-12 MED ORDER — ONDANSETRON HCL 4 MG/2ML IJ SOLN: 4.0000 mg | Freq: Four times a day (QID) | INTRAMUSCULAR | Status: DC | PRN

## 2017-07-12 MED ORDER — HEPARIN SODIUM (PORCINE) 5000 UNIT/ML IJ SOLN
5000.0000 [IU] | Freq: Three times a day (TID) | INTRAMUSCULAR | Status: DC
  Administered 2017-07-12 – 2017-07-16 (×11): 5000 [IU] via SUBCUTANEOUS
  Filled 2017-07-12 (×12): qty 1

## 2017-07-12 MED ORDER — FUROSEMIDE 40 MG PO TABS
40.0000 mg | ORAL_TABLET | Freq: Every day | ORAL | Status: DC
  Administered 2017-07-13 – 2017-07-16 (×4): 40 mg via ORAL
  Filled 2017-07-12 (×4): qty 1

## 2017-07-12 MED ORDER — SODIUM CHLORIDE 0.9 % IV SOLN
INTRAVENOUS | Status: DC
  Administered 2017-07-12 – 2017-07-14 (×2): via INTRAVENOUS

## 2017-07-12 MED ORDER — ALBUTEROL SULFATE (2.5 MG/3ML) 0.083% IN NEBU
2.5000 mg | INHALATION_SOLUTION | RESPIRATORY_TRACT | Status: DC | PRN
Start: 2017-07-12 — End: 2017-07-16

## 2017-07-12 NOTE — Progress Notes (Signed)
CODE SEPSIS - PHARMACY COMMUNICATION  **Broad Spectrum Antibiotics should be administered within 1 hour of Sepsis diagnosis**  Time Code Sepsis Called/Page Received: 17:45   Antibiotics Ordered: 18:04 spoke with Dr. York CeriseForbach - he is waiting for call back from ID and hopes to order abx soon.   18:06 order for vancomycin 1 gm IV x 1 and meropenem 2 gm IV x 1. Will send meropenem 2 gm IV x 1.   18:29 meropenem 2 gm tubed to ED. RN Helmut MusterAlicia made aware.   Time of 1st antibiotic administration: 18:43  Additional action taken by pharmacy:   If necessary, Name of Provider/Nurse Contacted: Tad MooreAlicia    Forever Arechiga A Dwaine Pringle ,Pharm.D., BCPS Clinical Pharmacist  07/12/2017  5:49 PM

## 2017-07-12 NOTE — ED Triage Notes (Signed)
Pt presents to ED via [facility] from [facility] c/o AMS and rash. EMS report they were initially called out for unresponsiveness. Upon their arrival sternal rub was performed and pt regained consciousness. Pt is somnolent in ED but does attempt to answer questions during triage when prompted. Confused at baseline. In hospice care/DNR. SNF staff also report scattered rash to pt's BLE, BUE, and abdomen.

## 2017-07-12 NOTE — ED Provider Notes (Signed)
[facility] Emergency Department Provider Note  ____________________________________________   First MD Initiated Contact with Patient 07/12/17 1716     (approximate)  I have reviewed the triage vital signs and the nursing notes.   HISTORY  Chief Complaint Altered Mental Status and Rash  Level 5 caveat:  history/ROS limited by acute/critical illness  HPI Timothy Hays is a 83 y.o. male with medical history as listed below who lives at assisted living but he likely and, according to his daughter, will need to be moved to skilled nursing soon.  He came to the emergency department by EMS for evaluation of altered mental status and rash.  Reportedly he was found unresponsive by the nursing home staff.  EMS was called and he awoke quickly to a sternal rub but then went back to sleep.  He is somnolent in the And answer questions when prompted.  His speech is slurred but his daughter says that this is baseline for him.  His daughter confirmed verbally and with the [facility] record that the patient is DNR/DNI.  Of note, the living facility staff reports that the patient has developed a rash on his legs which then spread to his abdomen and arms.  They describe them as bright red bumps and they seem to be spreading from legs to the rest of his body.  He also has an area of redness that looks sort of like a sunburn on his abdomen although he definitely has not been exposed to the sun recently.  He does not complain of any pain or itching of the lesions and except for being more sleepy than usual he has no complaints.  I woke him up with loud voice and gentle touch, and he grumble that he did not want to be here and and stated "I was sleeping just fine at home, I did not need to come here".  His family states that his voice is a little slurred at baseline and he sounds normal, but his level of sleepiness is not normal for him, and the rash is new.  The nursing facility reported to  EMS that at baseline his foreskin does not retract over the glans penis.  They did notice some discharge from the head of his penis recently.  He does not or is not able to complain of any pain.  He denies chest pain, shortness of breath, and abdominal pain.  Past Medical History:  Diagnosis Date  . Anginal pain (HCC)   . CAD (coronary artery disease)   . CKD (chronic kidney disease), stage III (HCC)   . Diabetes mellitus without complication (HCC)   . Dyspnea   . GERD (gastroesophageal reflux disease)   . History of gout   . Hyperlipidemia   . Hypertension   . Myocardial infarction (HCC)   . Peripheral vascular disease [facility])     Patient Active Problem List   Diagnosis Date Noted  . Hyperglycemia 03/16/2017  . Pressure injury of skin 03/16/2017  . Acute CHF (HCC) 12/18/2016  . Acute CHF (congestive heart failure) (HCC) 12/18/2016  . Sepsis (HCC) 09/02/2016    History reviewed. No pertinent surgical history.  Prior to Admission medications   Medication Sig Start Date End Date Taking? Authorizing Provider  acetaminophen (TYLENOL) 500 MG tablet Take 500 mg by mouth every 4 (four) hours as needed for mild pain.    Yes [provider]  budesonide (PULMICORT) 0.5 MG/2ML nebulizer solution Take 2 mLs (0.5 mg total) by nebulization  2 (two) times daily. 12/23/16  Yes Altamese Dilling, MD  docusate sodium (COLACE) 100 MG capsule Take 100 mg by mouth at bedtime.   Yes [provider]  ferrous sulfate 325 (65 FE) MG tablet Take 325 mg by mouth at bedtime.   Yes [provider]  furosemide (LASIX) 20 MG tablet Take 2 tablets (40 mg total) by mouth daily. 03/17/17  Yes Wieting, Richard, MD  gabapentin (NEURONTIN) 100 MG capsule Take 200-300 mg 3 (three) times daily by mouth. Take 2 capsules (200mg ) by mouth twice daily and 3 capsules (300mg ) at bedtime   Yes [provider]  insulin aspart (NOVOLOG) 100 UNIT/ML injection 4 units subcutaneous injection  prior to meals 03/17/17  Yes Wieting, Richard, MD  insulin glargine (LANTUS) 100 UNIT/ML injection Inject 0.18 mLs (18 Units total) into the skin daily. 03/18/17  Yes Wieting, Richard, MD  ipratropium-albuterol (DUONEB) 0.5-2.5 (3) MG/3ML SOLN Take 3 mLs by nebulization every 4 (four) hours. 12/23/16  Yes Altamese Dilling, MD  latanoprost (XALATAN) 0.005 % ophthalmic solution Place 1 drop into the right eye at bedtime.   Yes [provider]  metoprolol tartrate (LOPRESSOR) 25 MG tablet Take 0.5 tablets (12.5 mg total) by mouth 2 (two) times daily. 12/23/16  Yes Altamese Dilling, MD  pantoprazole (PROTONIX) 40 MG tablet Take 40 mg by mouth daily.  06/10/16  Yes [provider]  albuterol (PROVENTIL HFA;VENTOLIN HFA) 108 (90 Base) MCG/ACT inhaler Inhale 2 puffs into the lungs every 4 (four) hours as needed for wheezing or shortness of breath.    [provider]  ciprofloxacin (CIPRO) 500 MG tablet Take 1 tablet (500 mg total) by mouth 2 (two) times daily. Patient not taking: Reported on 07/12/2017 03/17/17   Alford Highland, MD  guaiFENesin-dextromethorphan [facility] DM) 100-10 MG/5ML syrup Take 10 mLs by mouth every 4 (four) hours as needed for cough.    [provider]  liver oil-zinc oxide (DESITIN) 40 % ointment Apply bis to buttocks Patient not taking: Reported on 07/12/2017 03/17/17   Alford Highland, MD  nitroGLYCERIN (NITROSTAT) 0.4 MG SL tablet Place 0.4 mg under the tongue every 5 (five) minutes as needed for chest pain.    [provider]  nystatin (MYCOSTATIN/NYSTOP) powder Apply tid to groin bilaterally 03/17/17   Wieting, Richard, MD  ondansetron (ZOFRAN) 4 MG tablet Take 4 mg by mouth every 6 (six) hours as needed for nausea or vomiting.    [provider]  polyethylene glycol (MIRALAX / GLYCOLAX) packet Take 17 g by mouth daily as needed for mild constipation.    [provider]    Allergies Fish allergy; Keflex  [cephalexin]; Morphine and related; and Penicillins  Family History  Problem Relation Age of Onset  . Hypertension Father   . Diabetes Neg Hx     Social History Social History   Tobacco Use  . Smoking status: Never Smoker  . Smokeless tobacco: Never Used  Substance Use Topics  . Alcohol use: No  . Drug use: No    Review of Systems Level 5 caveat:  history/ROS limited by acute/critical illness ____________________________________________   PHYSICAL EXAM:  VITAL SIGNS: ED Triage Vitals [07/12/17 1642]  Enc Vitals Group     BP (!) 122/58     Pulse Rate 74     Resp 18     Temp (!) 97.3 F (36.3 C)     Temp Source Oral     SpO2 95 %     Weight 95.3  kg (210 lb)     Height 1.753 m (5\' 9" )     Head Circumference      Peak Flow      Pain Score      Pain Loc      Pain Edu?      Excl. in GC?     Constitutional: Somnolent, awakens to loud voice and light touch.  No acute distress, difficult to determine level of illness/toxicity, but generally appears non-toxic Eyes: Conjunctivae are normal.  Head: Atraumatic. Nose: No congestion/rhinnorhea. Mouth/Throat: Mucous membranes are moist.  No oral lesions nor mucosal involvement (no rash) Neck: No stridor.  No meningeal signs; when awake, the patient rotates head side to side and flexes/extends without difficulty or pain Cardiovascular: Normal rate, regular rhythm. Good peripheral circulation. Grossly normal heart sounds. Respiratory: Normal respiratory effort.  No retractions. Lungs CTAB. Gastrointestinal: Soft and nontender. Apparent mild distention. Genitourinary: Foreskin cannot be retracted past glans chronically but does not appear consistent with phimosis or paraphimosis.  Purulent discharge from urethra.  In and out catheterization by nursing while I was present revealed grossly purulent urine. Musculoskeletal: No lower extremity tenderness nor edema. No gross deformities of extremities. Neurologic:  Normal speech and  language. No gross focal neurologic deficits are appreciated.  Skin: Erythematous nonblanching papules throughout lower extremities with no areas of confluence, no purpura nor petechiae.  He does have some erythema particularly at the distal part of his right lower extremity that appears consistent with chronic venous stasis but no obvious cellulitis.  The nonblanching papules extend up the full length of bilateral lower extremities and are scattered on his torso and are also present on bilateral upper extremities.  They are nontender.  He also has an area of what may be mild cellulitis on the anterior portion of his abdomen.  I took pictures with the [facility] app which are included below and also present in the Media tab of Chart Review:          ____________________________________________   LABS (all labs ordered are listed, but only abnormal results are displayed)  Labs Reviewed  COMPREHENSIVE METABOLIC PANEL - Abnormal; Notable for the following components:      Result Value   Chloride 98 (*)    Glucose, Bld 221 (*)    BUN 35 (*)    Creatinine, Ser 1.38 (*)    Calcium 8.4 (*)    Albumin 2.8 (*)    AST 13 (*)    ALT 8 (*)    GFR calc non Af Amer 42 (*)    GFR calc Af Amer 48 (*)    All other components within normal limits  CBC - Abnormal; Notable for the following components:   WBC 11.2 (*)    RBC 3.98 (*)    Hemoglobin 11.0 (*)    HCT 35.0 (*)    MCHC 31.4 (*)    RDW 18.0 (*)    All other components within normal limits  URINALYSIS, COMPLETE (UACMP) WITH MICROSCOPIC - Abnormal; Notable for the following components:   Color, Urine YELLOW (*)    APPearance CLOUDY (*)    Hgb urine dipstick SMALL (*)    Protein, ur 30 (*)    Leukocytes, UA LARGE (*)    Bacteria, UA MANY (*)    Squamous Epithelial / LPF 0-5 (*)    All other components within normal limits  BRAIN NATRIURETIC PEPTIDE - Abnormal; Notable for the following components:   B Natriuretic Peptide 337.0 (*)  All other components within normal limits  DIFFERENTIAL - Abnormal; Notable for the following components:   Neutro Abs 9.3 (*)    Lymphs Abs 0.8 (*)    Eosinophils Absolute 0.8 (*)    All other components within normal limits  URINE CULTURE  CULTURE, BLOOD (ROUTINE X 2)  CULTURE, BLOOD (ROUTINE X 2)  MRSA PCR SCREENING  LACTIC ACID, PLASMA  LIPASE, BLOOD  TROPONIN I  PROCALCITONIN  PROTIME-INR  BASIC METABOLIC PANEL  CBC   ____________________________________________  EKG  ED ECG REPORT I, Loghan Kurtzman, the attending physician, personally viewed and interpreted this ECG.  Date: 07/12/2017 EKG Time: 17:59 Rate: 71 Rhythm: atrial flutter QRS Axis: normal Intervals: LBBB and RBBB ST/T Wave abnormalities: Non-specific ST segment / T-wave changes, but no evidence of acute ischemia. Narrative Interpretation: no evidence of acute ischemia   ____________________________________________  RADIOLOGY   Dg Chest Port 1 View  Result Date: 07/12/2017 CLINICAL DATA:  Pt presents to ED via [facility] from [facility] c/o AMS and rash. EMS report they were initially called out for unresponsiveness. Upon their arrival sternal rub was performed and pt regained consciousnessHx/o CAD, CKD, HTN, and MI EXAM: PORTABLE CHEST 1 VIEW COMPARISON:  03/16/2017 FINDINGS: Cardiac silhouette is mildly enlarged. No convincing mediastinal or hilar masses. There is central vascular congestion with bilateral interstitial thickening and intervening hazy airspace opacity. This is greater in the lower lungs. There may be small effusions. No pneumothorax. Skeletal structures are demineralized but grossly intact. IMPRESSION: 1. Findings consistent with congestive heart failure. No convincing pneumonia. Electronically Signed   By: Amie Portland M.D.   On: 07/12/2017 18:36    ____________________________________________   PROCEDURES  Critical Care performed: Yes, see critical care procedure  note(s)   Procedure(s) performed:   .Critical Care Performed by: Loleta Rose, MD Authorized by: Loleta Rose, MD   Critical care provider statement:    Critical care time (minutes):  45   Critical care time was exclusive of:  Separately billable procedures and treating other patients   Critical care was necessary to treat or prevent imminent or life-threatening deterioration of the following conditions:  Sepsis   Critical care was time spent personally by me on the following activities:  Development of treatment plan with patient or surrogate, discussions with consultants, evaluation of patient's response to treatment, examination of patient, obtaining history from patient or surrogate, ordering and performing treatments and interventions, ordering and review of laboratory studies, ordering and review of radiographic studies, pulse oximetry, re-evaluation of patient's condition and review of old charts      ____________________________________________   INITIAL IMPRESSION / ASSESSMENT AND PLAN / ED COURSE  As part of my medical decision making, I reviewed the following data within the electronic MEDICAL RECORD NUMBER History obtained from family, Nursing notes reviewed and incorporated, Labs reviewed , EKG interpreted , Old chart reviewed, Discussed with admitting physician (Dr. Imogene Burn), A phone consult was requested and obtained from this/these consultant(s) (Dr. Drue Second, [facility]) and Notes from prior ED visits    Differential diagnosis is broad and includes but is not limited to infectious disease (urinary tract infection, bacteremia/septicemia, meningitis, etc.), ACS, CVA, etc.  I will treat him aggressively for possible sepsis.  His vital signs are stable but his temperature is slightly low although does not quite meet requirements for hypothermia.  Lab work is pending.  He reported to his family many years ago that he has a severe and life-threatening reaction to  penicillins and has also had dramatic GI side effects from cephalosporins.  Given the need for broad-spectrum coverage including possible meningococcemia, I will treat with the recommended penicillin allergic alternative regimen which is meropenem 2 g IV and vancomycin.  Give a small fluid bolus but given that he is hemodynamically stable and with no signs of shock at this time I will hold off from aggressive hydration.  I will contact infectious disease to discuss and I have taken pictures of the rash which are available in the computer.  Family agrees with the plan and does confirm DNR.  Clinical Course as of Jul 13 2119  Wynelle LinkSun Jul 12, 2017  1801 [facility], awaiting callback to discuss rash and [facility]  [CF]  1824 I discussed the case by phone with Dr. Judyann Munsonynthia Snider [facility]).  We discussed the case in detail and she reviewed the photographs that I put in the patient's chart.  We discussed the lack of meningeal signs/symptoms and the patient's somnolence but lack of toxic appearance.  Under the circumstances she not feel that the risk of a lumbar puncture on an uncooperative patient was worth the potential benefit.  She agreed with my sepsis workup and empiric antibiotics including the choice of meropenem and vancomycin as an reported penicillin/cephalosporin allergy.  I am giving him gentle fluids right now until we get any evidence that he is suffering from septic shock.  I will admit him once his basic lab work is back.  He is hemodynamically stable at this time.  [CF]  1906 reassuring lactate Lactic Acid, Venous: 1.8 [CF]  1906 reassuring procalcitonin no evidence of severe sepsis Procalcitonin: 0.10 [CF]  1906 CHF more likely than severe sepsis, will not continue IVF DG Chest Port 1 View [CF]  1906 No thrombocytopenia to explain the rash but generally labs are reassuring.  Metabolic panel shows a slight increase of creatinine but  otherwise is generally unremarkable.  Blood cultures are pending and may be the most important factor in his workup in addition to the urine culture.  I spoke by phone with Dr. Imogene Burnhen and I shared with him my conversation with infectious disease.  Dr. Imogene Burnhen will admit. Platelets: 226 [CF]    Clinical Course User Index [CF] Loleta RoseForbach, Neno Hohensee, MD    ____________________________________________  FINAL CLINICAL IMPRESSION(S) / ED DIAGNOSES  Final diagnoses:  Sepsis, due to unspecified organism (HCC)  Somnolence  Urinary tract infection without hematuria, site unspecified  Rash     MEDICATIONS GIVEN DURING THIS VISIT:  Medications  heparin injection 5,000 Units (not administered)  acetaminophen (TYLENOL) tablet 650 mg (not administered)    Or  acetaminophen (TYLENOL) suppository 650 mg (not administered)  ondansetron (ZOFRAN) tablet 4 mg (not administered)    Or  ondansetron (ZOFRAN) injection 4 mg (not administered)  albuterol (PROVENTIL) (2.5 MG/3ML) 0.083% nebulizer solution 2.5 mg (not administered)  0.9 %  sodium chloride infusion (not administered)  senna-docusate (Senokot-S) tablet 1 tablet (not administered)  bisacodyl (DULCOLAX) EC tablet 5 mg (not administered)  levofloxacin (LEVAQUIN) IVPB 750 mg (not administered)  budesonide (PULMICORT) nebulizer solution 0.5 mg (not administered)  docusate sodium (COLACE) capsule 100 mg (not administered)  ferrous sulfate tablet 325 mg (not administered)  furosemide (LASIX) tablet 40 mg (not administered)  gabapentin (NEURONTIN) capsule 200-300 mg (not administered)  guaiFENesin-dextromethorphan (ROBITUSSIN DM) 100-10 MG/5ML syrup 10 mL (not administered)  insulin aspart (novoLOG) injection 4 Units (not administered)  insulin glargine (LANTUS) injection 18 Units (not administered)  latanoprost (XALATAN) 0.005 % ophthalmic solution 1 drop (not administered)  metoprolol tartrate (LOPRESSOR) tablet 12.5 mg (not administered)  nitroGLYCERIN  (NITROSTAT) SL tablet 0.4 mg (not administered)  pantoprazole (PROTONIX) EC tablet 40 mg (not administered)  polyethylene glycol (MIRALAX / GLYCOLAX) packet 17 g (not administered)  diphenhydrAMINE (BENADRYL) capsule 25 mg (not administered)  aztreonam (AZACTAM) 1 g in dextrose 5 % 50 mL IVPB (not administered)  sodium chloride 0.9 % bolus 500 mL (0 mLs Intravenous Stopped 07/12/17 1957)  dexamethasone (DECADRON) injection 10 mg (10 mg Intravenous Given 07/12/17 1849)  meropenem (MERREM) 2 g in sodium chloride 0.9 % 100 mL IVPB (0 g Intravenous Stopped 07/12/17 1913)  vancomycin (VANCOCIN) IVPB 1000 mg/200 mL premix (0 mg Intravenous Stopped 07/12/17 1950)     NEW OUTPATIENT MEDICATIONS STARTED DURING THIS VISIT:  This SmartLink is deprecated. Use AVSMEDLIST instead to display the medication list for a patient.  This SmartLink is deprecated. Use AVSMEDLIST instead to display the medication list for a patient.  This SmartLink is deprecated. Use AVSMEDLIST instead to display the medication list for a patient.   Note:  This document was prepared using Dragon voice recognition software and may include unintentional dictation errors.    Loleta Rose, MD 07/12/17 2121

## 2017-07-12 NOTE — Progress Notes (Addendum)
ANTIBIOTIC CONSULT NOTE - INITIAL  Pharmacy Consult for  levofloxacin and aztreonam Indication: meningitis  Allergies  Allergen Reactions  . Fish Allergy Anaphylaxis  . Keflex [Cephalexin] Nausea And Vomiting    Daughter advises that his vomiting is extremely "voilent and severe".  . Morphine And Related Nausea And Vomiting  . Penicillins     Has patient had a PCN reaction causing immediate rash, facial/tongue/throat swelling,} If all of the above answers are "NO", then may proceed with Cephalosporin use. SOB or lightheadedness with hypotension: No Has patient had a PCN reaction causing severe rash involving mucus membranes or skin necrosis: Unknown Has patient had a PCN reaction that required hospitalization Unknown Has patient had a PCN reaction occurring within the last 10 years: No     Patient Measurements: Height: 5\' 9"  (175.3 cm) Weight: 210 lb (95.3 kg) IBW/kg (Calculated) : 70.7 Adjusted Body Weight:   Vital Signs: Temp: 98 F (36.7 C) (11/04 1843) Temp Source: Rectal (11/04 1843) BP: 129/59 (11/04 1830) Pulse Rate: 91 (11/04 1830) Intake/Output from previous day: No intake/output data recorded. Intake/Output from this shift: No intake/output data recorded.  Labs: Recent Labs    07/12/17 1729  WBC 11.2*  HGB 11.0*  PLT 226  CREATININE 1.38*   Estimated Creatinine Clearance: 35.6 mL/min (A) (by C-G formula based on SCr of 1.38 mg/dL (H)). No results for input(s): VANCOTROUGH, VANCOPEAK, VANCORANDOM, GENTTROUGH, GENTPEAK, GENTRANDOM, TOBRATROUGH, TOBRAPEAK, TOBRARND, AMIKACINPEAK, AMIKACINTROU, AMIKACIN in the last 72 hours.   Microbiology: No results found for this or any previous visit (from the past 720 hour(s)).  Medical History: Past Medical History:  Diagnosis Date  . Anginal pain (HCC)   . CAD (coronary artery disease)   . CKD (chronic kidney disease), stage III (HCC)   . Diabetes mellitus without complication (HCC)   . Dyspnea   . GERD  (gastroesophageal reflux disease)   . History of gout   . Hyperlipidemia   . Hypertension   . Myocardial infarction (HCC)   . Peripheral vascular disease (HCC)     Medications:  Infusions:  . meropenem (MERREM) IV 2 g (07/12/17 1843)  . [START ON 07/13/2017] meropenem (MERREM) IV    . [START ON 07/13/2017] vancomycin    . vancomycin 1,000 mg (07/12/17 1850)   Assessment: 89 yom cc AMS and rash. PMH anginal pain, CAD, CKD, DM, dyspnea, GERD, gout, HLD, HTN, MI, PVD. Found unresponsive by staff at ALF. ID consulted by EDP - no LP, however start empiric abx for sepsis/meningitis. Allergy reported to PCN/cephalosporin - use vanc/meropenem.  Goal of Therapy:  Resolve infection Prevent ADE Vancomycin trough 15 to 20 mcg/mL.  Plan:  Hospitalist discontinues vanc and meropenem. Initiates levofloxacin and aztreonam.  1. Levofloxacin 750 mg IV Q48H 2. Aztreonam 1 gm IV Q8H  Carola FrostNathan A Dahlton Hinde, Pharm.D., BCPS Clinical Pharmacist 07/12/2017,7:01 PM

## 2017-07-12 NOTE — ED Notes (Signed)
Previous note charted in error

## 2017-07-12 NOTE — H&P (Signed)
[facility] [place] at [facility]   PATIENT NAME: Timothy Hays    MR#:  [id number]  DATE OF BIRTH:  1919/03/18  DATE OF ADMISSION:  07/12/2017  PRIMARY CARE PHYSICIAN: Leanna Sato, MD   REQUESTING/REFERRING PHYSICIAN: Loleta Rose, MD  CHIEF COMPLAINT:   Chief Complaint  Patient presents with  . Altered Mental Status  . Rash   Altered mental status and body rash today HISTORY OF PRESENT ILLNESS:  Virgel Haro  is a 83 y.o. male with a known history of recurrent UTI, CAD, CKD stage III, hypertension, diabetes, hyperlipidemia, PVD and GERD.  The patient is unresponsive to verbal stimuli and is somnolent. Per his daughter, the patient has had generalized weakness for the past few days.  He was found somnolent, unresponsive and body rash in assisted living facility today.  She is found tachypnea, leukocytosis and UTI, treated with antibiotics in the ED.  PAST MEDICAL HISTORY:   Past Medical History:  Diagnosis Date  . Anginal pain (HCC)   . CAD (coronary artery disease)   . CKD (chronic kidney disease), stage III (HCC)   . Diabetes mellitus without complication (HCC)   . Dyspnea   . GERD (gastroesophageal reflux disease)   . History of gout   . Hyperlipidemia   . Hypertension   . Myocardial infarction (HCC)   . Peripheral vascular disease (HCC)     PAST SURGICAL HISTORY:  History reviewed. No pertinent surgical history.  SOCIAL HISTORY:   Social History   Tobacco Use  . Smoking status: Never Smoker  . Smokeless tobacco: Never Used  Substance Use Topics  . Alcohol use: No    FAMILY HISTORY:   Family History  Problem Relation Age of Onset  . Hypertension Father   . Diabetes Neg Hx     DRUG ALLERGIES:   Allergies  Allergen Reactions  . Fish Allergy Anaphylaxis  . Keflex [Cephalexin] Nausea And Vomiting    Daughter advises that his vomiting is extremely "voilent and severe".  . Morphine And Related Nausea And Vomiting  .  Penicillins     Has patient had a PCN reaction causing immediate rash, facial/tongue/throat swelling,} If all of the above answers are "NO", then may proceed with Cephalosporin use. SOB or lightheadedness with hypotension: No Has patient had a PCN reaction causing severe rash involving mucus membranes or skin necrosis: Unknown Has patient had a PCN reaction that required hospitalization Unknown Has patient had a PCN reaction occurring within the last 10 years: No     REVIEW OF SYSTEMS:   Review of Systems  Unable to perform ROS: Mental status change    MEDICATIONS AT HOME:   Prior to Admission medications   Medication Sig Start Date End Date Taking? Authorizing Provider  acetaminophen (TYLENOL) 500 MG tablet Take 500 mg by mouth every 4 (four) hours as needed for mild pain.    Yes [provider]  budesonide (PULMICORT) 0.5 MG/2ML nebulizer solution Take 2 mLs (0.5 mg total) by nebulization 2 (two) times daily. 12/23/16  Yes Altamese Dilling, MD  docusate sodium (COLACE) 100 MG capsule Take 100 mg by mouth at bedtime.   Yes [provider]  ferrous sulfate 325 (65 FE) MG tablet Take 325 mg by mouth at bedtime.   Yes [provider]  furosemide (LASIX) 20 MG tablet Take 2 tablets (40 mg total) by mouth daily. 03/17/17  Yes Wieting, Richard, MD  gabapentin (NEURONTIN) 100 MG capsule Take 200-300 mg 3 (  three) times daily by mouth. Take 2 capsules (200mg ) by mouth twice daily and 3 capsules (300mg ) at bedtime   Yes [provider]  insulin aspart (NOVOLOG) 100 UNIT/ML injection 4 units subcutaneous injection prior to meals 03/17/17  Yes Wieting, Richard, MD  insulin glargine (LANTUS) 100 UNIT/ML injection Inject 0.18 mLs (18 Units total) into the skin daily. 03/18/17  Yes Wieting, Richard, MD  ipratropium-albuterol (DUONEB) 0.5-2.5 (3) MG/3ML SOLN Take 3 mLs by nebulization every 4 (four) hours. 12/23/16  Yes Altamese DillingVachhani, Vaibhavkumar, MD  latanoprost  (XALATAN) 0.005 % ophthalmic solution Place 1 drop into the right eye at bedtime.   Yes [provider]  metoprolol tartrate (LOPRESSOR) 25 MG tablet Take 0.5 tablets (12.5 mg total) by mouth 2 (two) times daily. 12/23/16  Yes Altamese DillingVachhani, Vaibhavkumar, MD  pantoprazole (PROTONIX) 40 MG tablet Take 40 mg by mouth daily.  06/10/16  Yes [provider]  albuterol (PROVENTIL HFA;VENTOLIN HFA) 108 (90 Base) MCG/ACT inhaler Inhale 2 puffs into the lungs every 4 (four) hours as needed for wheezing or shortness of breath.    [provider]  ciprofloxacin (CIPRO) 500 MG tablet Take 1 tablet (500 mg total) by mouth 2 (two) times daily. Patient not taking: Reported on 07/12/2017 03/17/17   Alford HighlandWieting, Richard, MD  guaiFENesin-dextromethorphan [facility](ROBITUSSIN DM) 100-10 MG/5ML syrup Take 10 mLs by mouth every 4 (four) hours as needed for cough.    [provider]  liver oil-zinc oxide (DESITIN) 40 % ointment Apply bis to buttocks Patient not taking: Reported on 07/12/2017 03/17/17   Alford HighlandWieting, Richard, MD  nitroGLYCERIN (NITROSTAT) 0.4 MG SL tablet Place 0.4 mg under the tongue every 5 (five) minutes as needed for chest pain.    [provider]  nystatin (MYCOSTATIN/NYSTOP) powder Apply tid to groin bilaterally 03/17/17   Wieting, Richard, MD  ondansetron (ZOFRAN) 4 MG tablet Take 4 mg by mouth every 6 (six) hours as needed for nausea or vomiting.    [provider]  polyethylene glycol (MIRALAX / GLYCOLAX) packet Take 17 g by mouth daily as needed for mild constipation.    [provider]      VITAL SIGNS:  Blood pressure (!) 142/56, pulse 67, temperature 98 F (36.7 C), temperature source Rectal, resp. rate 20, height 5\' 9"  (1.753 m), weight 210 lb (95.3 kg), SpO2 95 %.  PHYSICAL EXAMINATION:  Physical Exam  GENERAL:  83 y.o.-year-old patient lying in the bed with no acute distress.  Morbid obesity. EYES: Pupils equal, round, reactive to light and  accommodation. No scleral icterus.  HEENT: Head atraumatic, normocephalic. Oropharynx and nasopharynx clear.  NECK:  Supple, no jugular venous distention. No thyroid enlargement, no tenderness.  LUNGS: Normal breath sounds bilaterally, no wheezing, rales,rhonchi or crepitation. No use of accessory muscles of respiration.  CARDIOVASCULAR: S1, S2 normal. No murmurs, rubs, or gallops.  ABDOMEN: Soft, nontender, nondistended. Bowel sounds present. No organomegaly or mass.  EXTREMITIES: No pedal edema, cyanosis, or clubbing.  NEUROLOGIC: Unable to examine. PSYCHIATRIC: The patient is somnolent and lethargic, unresponsive to verbal and pain stimuli. SKIN: rash (red bumps) on bilateral leg and abdomen.  LABORATORY PANEL:   CBC Recent Labs  Lab 07/12/17 1729  WBC 11.2*  HGB 11.0*  HCT 35.0*  PLT 226   ------------------------------------------------------------------------------------------------------------------  Chemistries  Recent Labs  Lab 07/12/17 1729  NA 135  K 4.1  CL 98*  CO2 29  GLUCOSE 221*  BUN 35*  CREATININE 1.38*  CALCIUM 8.4*  AST 13*  ALT 8*  ALKPHOS 49  BILITOT 0.4   ------------------------------------------------------------------------------------------------------------------  Cardiac Enzymes Recent Labs  Lab 07/12/17 1729  TROPONINI <0.03   ------------------------------------------------------------------------------------------------------------------  RADIOLOGY:  Dg Chest Port 1 View  Result Date: 07/12/2017 CLINICAL DATA:  Pt presents to ED via [facility] from [facility] c/o AMS and rash. EMS report they were initially called out for unresponsiveness. Upon their arrival sternal rub was performed and pt regained consciousnessHx/o CAD, CKD, HTN, and MI EXAM: PORTABLE CHEST 1 VIEW COMPARISON:  03/16/2017 FINDINGS: Cardiac silhouette is mildly enlarged. No convincing mediastinal or hilar masses. There is central vascular congestion with  bilateral interstitial thickening and intervening hazy airspace opacity. This is greater in the lower lungs. There may be small effusions. No pneumothorax. Skeletal structures are demineralized but grossly intact. IMPRESSION: 1. Findings consistent with congestive heart failure. No convincing pneumonia. Electronically Signed   By: Amie Portland M.D.   On: 07/12/2017 18:36      IMPRESSION AND PLAN:   Sepsis due to UTI. The patient will be admitted to medical floor. Continue antibiotics, follow-up CBC, blood culture and urine culture. IV fluid support.  Acute metabolic encephalopathy due to above.  Aspiration the fall precaution.  Rash.  Unclear etiology, Benadryl as needed.  CKD stage III.  Stable.  Diabetes.  Start sliding scale, continue Lantus Hypertension.  Continue home hypertension medication.  All the records are reviewed and case discussed with ED provider. Management plans discussed with the patient's daughter and son-in-law and they are in agreement.  CODE STATUS: DNR  TOTAL TIME TAKING CARE OF THIS PATIENT: 56 minutes.    Shaune Pollack M.D on 07/12/2017 at 7:38 PM  Between 7am to 6pm - Pager - [phone]  After 6pm go to [email or web address] - Social research officer, government  [facility] [place] [facility]  Office  [phone]  CC: Primary care physician; Leanna Sato, MD   Note: This dictation was prepared with Dragon dictation along with smaller phrase technology. Any transcriptional errors that result from this process are unin

## 2017-07-13 ENCOUNTER — Other Ambulatory Visit: Payer: Self-pay

## 2017-07-13 DIAGNOSIS — L89153 Pressure ulcer of sacral region, stage 3: Secondary | ICD-10-CM

## 2017-07-13 LAB — BASIC METABOLIC PANEL
Anion gap: 8 (ref 5–15)
BUN: 33 mg/dL — ABNORMAL HIGH (ref 6–20)
CALCIUM: 8.6 mg/dL — AB (ref 8.9–10.3)
CO2: 29 mmol/L (ref 22–32)
CREATININE: 1.19 mg/dL (ref 0.61–1.24)
Chloride: 98 mmol/L — ABNORMAL LOW (ref 101–111)
GFR, EST AFRICAN AMERICAN: 58 mL/min — AB (ref >60)
GFR, EST NON AFRICAN AMERICAN: 50 mL/min — AB (ref >60)
GLUCOSE: 379 mg/dL — AB (ref 65–99)
Potassium: 5.1 mmol/L (ref 3.5–5.1)
Sodium: 135 mmol/L (ref 135–145)

## 2017-07-13 LAB — GLUCOSE, CAPILLARY
GLUCOSE-CAPILLARY: 338 mg/dL — AB (ref 65–99)
GLUCOSE-CAPILLARY: 321 mg/dL — AB (ref 65–99)
GLUCOSE-CAPILLARY: 288 mg/dL — AB (ref 65–99)
Glucose-Capillary: 267 mg/dL — ABNORMAL HIGH (ref 65–99)
Glucose-Capillary: 270 mg/dL — ABNORMAL HIGH (ref 65–99)

## 2017-07-13 LAB — BLOOD CULTURE ID PANEL (REFLEXED)
Acinetobacter baumannii: NOT DETECTED
CANDIDA GLABRATA: NOT DETECTED
CANDIDA TROPICALIS: NOT DETECTED
Candida albicans: NOT DETECTED
Candida krusei: NOT DETECTED
Candida parapsilosis: NOT DETECTED
ENTEROCOCCUS SPECIES: NOT DETECTED
Enterobacter cloacae complex: NOT DETECTED
Enterobacteriaceae species: NOT DETECTED
Escherichia coli: NOT DETECTED
HAEMOPHILUS INFLUENZAE: NOT DETECTED
KLEBSIELLA PNEUMONIAE: NOT DETECTED
Klebsiella oxytoca: NOT DETECTED
Listeria monocytogenes: NOT DETECTED
METHICILLIN RESISTANCE: NOT DETECTED
NEISSERIA MENINGITIDIS: NOT DETECTED
PROTEUS SPECIES: NOT DETECTED
Pseudomonas aeruginosa: NOT DETECTED
SERRATIA MARCESCENS: NOT DETECTED
STAPHYLOCOCCUS AUREUS BCID: NOT DETECTED
STAPHYLOCOCCUS SPECIES: DETECTED — AB
STREPTOCOCCUS SPECIES: NOT DETECTED
Streptococcus agalactiae: NOT DETECTED
Streptococcus pneumoniae: NOT DETECTED
Streptococcus pyogenes: NOT DETECTED

## 2017-07-13 LAB — CBC
HCT: 35.4 % — ABNORMAL LOW (ref 40.0–52.0)
Hemoglobin: 11.4 g/dL — ABNORMAL LOW (ref 13.0–18.0)
MCH: 28.3 pg (ref 26.0–34.0)
MCHC: 32.1 g/dL (ref 32.0–36.0)
MCV: 88.1 fL (ref 80.0–100.0)
Platelets: 217 10*3/uL (ref 150–440)
RBC: 4.01 MIL/uL — ABNORMAL LOW (ref 4.40–5.90)
RDW: 17.9 % — ABNORMAL HIGH (ref 11.5–14.5)
WBC: 8.8 10*3/uL (ref 3.8–10.6)

## 2017-07-13 MED ORDER — ORAL CARE MOUTH RINSE
15.0000 mL | Freq: Two times a day (BID) | OROMUCOSAL | Status: DC
  Administered 2017-07-13 – 2017-07-15 (×4): 15 mL via OROMUCOSAL

## 2017-07-13 MED ORDER — NYSTATIN 100000 UNIT/GM EX POWD
Freq: Four times a day (QID) | CUTANEOUS | Status: DC
  Administered 2017-07-13 – 2017-07-16 (×9): via TOPICAL
  Filled 2017-07-13: qty 15

## 2017-07-13 MED ORDER — ENSURE ENLIVE PO LIQD
237.0000 mL | Freq: Two times a day (BID) | ORAL | Status: DC
  Administered 2017-07-14 – 2017-07-16 (×4): 237 mL via ORAL

## 2017-07-13 NOTE — Progress Notes (Signed)
Inpatient Diabetes Program Recommendations  AACE/ADA: New Consensus Statement on Inpatient Glycemic Control (2015)  Target Ranges:  Prepandial:   less than 140 mg/dL      Peak postprandial:   less than 180 mg/dL (1-2 hours)      Critically ill patients:  140 - 180 mg/dL   Results for [place] CellarBECKOM, Plato T (MRN [id number]) as of 07/13/2017 09:50  Ref. Range 07/12/2017 22:37 07/13/2017 07:50  Glucose-Capillary Latest Ref Range: 65 - 99 mg/dL 253292 (H) 664338 (H)    Admit with: Sepsis/ UTI  History: DM, CKD  Home DM Meds: Lantus 18 units daily       Novolog 4 units TID  Current Insulin Orders: Lantus 18 units daily                 Novolog 4 units TID      MD- Note Lantus starting this AM.  Please also consider placing orders for Novolog Moderate Correction Scale/ SSI (0-15 units) TID AC + HS       --Will follow patient during hospitalization--  Ambrose FinlandJeannine Johnston Tesla Bochicchio RN, MSN, CDE Diabetes Coordinator Inpatient Glycemic Control Team Team Pager: [phone] (8a-5p)

## 2017-07-13 NOTE — Progress Notes (Signed)
PHARMACY - PHYSICIAN COMMUNICATION CRITICAL VALUE ALERT - BLOOD CULTURE IDENTIFICATION (BCID)  Results for orders placed or performed during the hospital encounter of 07/12/17  Blood Culture ID Panel (Reflexed) (Collected: 07/12/2017  5:29 PM)  Result Value Ref Range   Enterococcus species NOT DETECTED NOT DETECTED   Listeria monocytogenes NOT DETECTED NOT DETECTED   Staphylococcus species DETECTED (A) NOT DETECTED   Staphylococcus aureus NOT DETECTED NOT DETECTED   Methicillin resistance NOT DETECTED NOT DETECTED   Streptococcus species NOT DETECTED NOT DETECTED   Streptococcus agalactiae NOT DETECTED NOT DETECTED   Streptococcus pneumoniae NOT DETECTED NOT DETECTED   Streptococcus pyogenes NOT DETECTED NOT DETECTED   Acinetobacter baumannii NOT DETECTED NOT DETECTED   Enterobacteriaceae species NOT DETECTED NOT DETECTED   Enterobacter cloacae complex NOT DETECTED NOT DETECTED   Escherichia coli NOT DETECTED NOT DETECTED   Klebsiella oxytoca NOT DETECTED NOT DETECTED   Klebsiella pneumoniae NOT DETECTED NOT DETECTED   Proteus species NOT DETECTED NOT DETECTED   Serratia marcescens NOT DETECTED NOT DETECTED   Haemophilus influenzae NOT DETECTED NOT DETECTED   Neisseria meningitidis NOT DETECTED NOT DETECTED   Pseudomonas aeruginosa NOT DETECTED NOT DETECTED   Candida albicans NOT DETECTED NOT DETECTED   Candida glabrata NOT DETECTED NOT DETECTED   Candida krusei NOT DETECTED NOT DETECTED   Candida parapsilosis NOT DETECTED NOT DETECTED   Candida tropicalis NOT DETECTED NOT DETECTED    Name of physician (or Provider) Contacted: Dr. Amado CoeGouru  Changes to prescribed antibiotics required: No changes at this time per MD - wishes to continue levofloxacin and aztreonam. ID consulted.  Cindi CarbonMary M Treyden Hakim, PharmD 07/13/2017  2:46 PM

## 2017-07-13 NOTE — Clinical Social Work Note (Signed)
Clinical Social Work Assessment  Patient Details  Name: Timothy Hays MRN: [id number] Date of Birth: 05/08/1919  Date of referral:  07/13/17               Reason for consult:  Discharge Planning                Permission sought to share information with:    Permission granted to share information::     Name::        Agency::     Relationship::     Contact Information:     Housing/Transportation Living arrangements for the past 2 months:  Assisted Living Facility Source of Information:  Facility Patient Interpreter Needed:  None Criminal Activity/Legal Involvement Pertinent to Current Situation/Hospitalization:  No - Comment as needed Significant Relationships:  Adult Children Lives with:  Facility Resident Do you feel safe going back to the place where you live?    Need for family participation in patient care:     Care giving concerns:  none   Social Worker assessment / plan:  Patient was in the hospital previously and was wheelchair bound and had to have a hoyer lift. [name] a [facility] term [facility] of [facility] and has [facility] following. [name] stated that as long as patient was medically stable, that they will take patient back.  Employment status:  Retired Health and safety inspectornsurance information:  Armed forces operational officerMedicare, Medicaid [place] Recommendations:    Information / Referral to community resources:     Patient/Family's Response to care:  CSW to speak with patient's daughter.   Patient/Family's Understanding of and Emotional Response to Diagnosis, Current Treatment, and Prognosis:  CSW to speak with patient's daughter.  Emotional Assessment Appearance:    Attitude/Demeanor/Rapport:    Affect (typically observed):    Orientation:    Alcohol / Substance use:  Not Applicable Psych involvement (Current and /or in the community):  No (Comment)  Discharge Needs  Concerns to be addressed:  No discharge needs  identified Readmission within the last 30 days:  No Current discharge risk:  None Barriers to Discharge:  No Barriers Identified   York SpanielMonica Abrahan Fulmore, LCSW 07/13/2017, 4:14 PM

## 2017-07-13 NOTE — Progress Notes (Signed)
Visit made. [name] currently followed by [facility] with a hospice diagnosis of Hypertensive heart and kidney disease. [facility] assessment of rash, fever and change in mentation. Hospice was notified prior to transport, hospice nurse was at the facility when patient's daughter requested his transfer. He is a DNR code, Out of Facility DNR and transfer summary in place in patient's hospital chart. Patient seen sitting up in bed, had fallen asleep while eating his breakfast, easily awakened by voice. Assisted patient with his coffee. Patient oriented to place. Denied pain or discomfort. Reports poor appetite. He is currently receiving IV antibiotics for sepsis related to a UTI. Patient's daughter Elita Quickam is aware of admission, no family at bedside.Pateint resting comfortably at end of visit, denied any needs. Will continue to follow and update hospice team. Hospital care team aware that patient is currently followed by [facility] Thank you. Dayna BarkerKaren Robertson RN, BSN, [facility], hospital liaison [phone] c

## 2017-07-13 NOTE — Progress Notes (Signed)
Initial Nutrition Assessment  DOCUMENTATION CODES:   Obesity unspecified  INTERVENTION:  Recommend liberalizing diet in setting of advanced age and poor appetite.  Provide Ensure Enlive po BID with meals, each supplement provides 350 kcal and 20 grams of protein. Patient prefers chocolate.  NUTRITION DIAGNOSIS:   Inadequate oral intake related to poor appetite as evidenced by per patient/family report.  GOAL:   Patient will meet greater than or equal to 90% of their needs  MONITOR:   PO intake, Supplement acceptance, Labs, Weight trends, I & O's  REASON FOR ASSESSMENT:   Low Braden    ASSESSMENT:   83 year old male with PMHx of CAD, hx MI, DM type 2, HLD, gout, CKD Stg III, HTN, PVD who presented from [facility] after being found somnolent, unresponsive, and with a body rash. Admitted with sepsis from UTI.   Met with patient, his daughter, and son-in-law at bedside. [name] hard of hearing. He does not have a good appetite today and is unsure why. Per chart he finished 80% of his breakfast this morning. He reports he does not want anything else to eat or drink today, even water. Patient reports he typically eats very well at all three meals daily, but is unable to provide recall on intake. He enjoys chocolate Ensure. Patient denies any N/V, abdominal pain, difficulty chewing/swallowing.  Family reports patient has not lost any weight recently. They report he has gained about 16 lbs this year.   Medications reviewed and include: Colace, ferrous sulfate 325 mg daily, Lasix 40 mg daily, Novolog 4 units TID, Lantus 18 units daily, pantoprazole, Levaquin.  Labs reviewed: CBG 292-321, Chloride 98, BUN 33.  Patient does not meet criteria for malnutrition at this time.  NUTRITION - FOCUSED PHYSICAL EXAM:    Most Recent Value  Orbital Region  No depletion  Upper Arm Region  No depletion  Thoracic and Lumbar Region  No depletion  Buccal Region  No depletion  Temple  Region  No depletion  Clavicle Bone Region  No depletion  Clavicle and Acromion Bone Region  No depletion  Scapular Bone Region  No depletion  Dorsal Hand  No depletion  Patellar Region  No depletion  Anterior Thigh Region  No depletion  Posterior Calf Region  No depletion  Edema (RD Assessment)  Mild [to bilateral lower extremities]  Hair  Reviewed  Eyes  Reviewed  Mouth  Reviewed  Skin  Reviewed  Nails  Reviewed     Diet Order:  Diet regular Room service appropriate? Yes; Fluid consistency: Thin  EDUCATION NEEDS:   No education needs have been identified at this time  Skin:  Skin Assessment: Skin Integrity Issues: Skin Integrity Issues:: Stage I, Other (Comment) Stage I: coccyx Other: MSAD to groin  Last BM:  07/13/2017 - medium type 5  Height:   Ht Readings from Last 1 Encounters:  07/12/17 5' 9"  (1.753 m)    Weight:   Wt Readings from Last 1 Encounters:  07/13/17 221 lb 4.8 oz (100.4 kg)    Ideal Body Weight:  72.7 kg  BMI:  Body mass index is 32.68 kg/m.  Estimated Nutritional Needs:   Kcal:  1950-2120 (MSJ x 1.2-1.3)  Protein:  100-110 grams (1-1.1 grams/kg)  Fluid:  1.8 L/day (25 mL/kg IBW0  Willey Blade, MS, RD, LDN Office: [phone] Pager: [phone] After Hours/Weekend Pager: [phone]

## 2017-07-13 NOTE — Progress Notes (Addendum)
[facility] [place] at [facility]   PATIENT NAME: Timothy Hays    MR#:  [id number]  DATE OF BIRTH:  1918-10-06  SUBJECTIVE:  CHIEF COMPLAINT: [name] altered  REVIEW OF SYSTEMS:  Unobtainable as he is encephalopathic  DRUG ALLERGIES:   Allergies  Allergen Reactions  . Fish Allergy Anaphylaxis  . Keflex [Cephalexin] Nausea And Vomiting    Daughter advises that his vomiting is extremely "voilent and severe".  . Morphine And Related Nausea And Vomiting  . Penicillins     Has patient had a PCN reaction causing immediate rash, facial/tongue/throat swelling,} If all of the above answers are "NO", then may proceed with Cephalosporin use. SOB or lightheadedness with hypotension: No Has patient had a PCN reaction causing severe rash involving mucus membranes or skin necrosis: Unknown Has patient had a PCN reaction that required hospitalization Unknown Has patient had a PCN reaction occurring within the last 10 years: No     VITALS:  Blood pressure (!) 178/79, pulse 66, temperature 97.8 F (36.6 C), temperature source Oral, resp. rate 20, height 5\' 9"  (1.753 m), weight 100.4 kg (221 lb 4.8 oz), SpO2 92 %.  PHYSICAL EXAMINATION:  GENERAL:  83 y.o.-year-old patient lying in the bed with no acute distress.  EYES: Pupils equal, round, reactive to light and accommodation. No scleral icterus. HEENT: Head atraumatic, normocephalic. Oropharynx and nasopharynx clear.  NECK:  Supple, no jugular venous distention. No thyroid enlargement, no tenderness.  LUNGS: Moderate breath sounds bilaterally, no wheezing, rales,rhonchi or crepitation. No use of accessory muscles of respiration.  CARDIOVASCULAR: S1, S2 normal. No murmurs, rubs, or gallops.  ABDOMEN: Soft, nontender, nondistended. Bowel sounds present. No organomegaly or mass.  EXTREMITIES: No pedal edema, cyanosis, or clubbing.  NEUROLOGIC: Encephalopathic PSYCHIATRIC: The patient is alert and disoriented  sKIN:  No obvious rash, lesion, or ulcer.    LABORATORY PANEL:   CBC Recent Labs  Lab 07/13/17 0420  WBC 8.8  HGB 11.4*  HCT 35.4*  PLT 217   ------------------------------------------------------------------------------------------------------------------  Chemistries  Recent Labs  Lab 07/12/17 1729 07/13/17 0420  NA 135 135  K 4.1 5.1  CL 98* 98*  CO2 29 29  GLUCOSE 221* 379*  BUN 35* 33*  CREATININE 1.38* 1.19  CALCIUM 8.4* 8.6*  AST 13*  --   ALT 8*  --   ALKPHOS 49  --   BILITOT 0.4  --    ------------------------------------------------------------------------------------------------------------------  Cardiac Enzymes Recent Labs  Lab 07/12/17 1729  TROPONINI <0.03   ------------------------------------------------------------------------------------------------------------------  RADIOLOGY:  Dg Chest Port 1 View  Result Date: 07/12/2017 CLINICAL DATA:  [facility]  c/o AMS and rash. EMS report they were initially called out for unresponsiveness. Upon their arrival sternal rub was performed and pt regained consciousnessHx/o CAD, CKD, HTN, and MI EXAM: PORTABLE CHEST 1 VIEW COMPARISON:  03/16/2017 FINDINGS: Cardiac silhouette is mildly enlarged. No convincing mediastinal or hilar masses. There is central vascular congestion with bilateral interstitial thickening and intervening hazy airspace opacity. This is greater in the lower lungs. There may be small effusions. No pneumothorax. Skeletal structures are demineralized but grossly intact. IMPRESSION: 1. Findings consistent with congestive heart failure. No convincing pneumonia. Electronically Signed   By: Amie Portland M.D.   On: 07/12/2017 18:36    EKG:   Orders placed or performed during the hospital encounter of 07/12/17  . EKG 12-Lead  . EKG 12-Lead    ASSESSMENT AND PLAN:  Sepsis due to UTI. Continue antibiotics IV levofloxacin, follow-up CBC, blood culture  coag negative Staphylococcus and urine culture pending IV fluid support. MRSA PCR negative ID consult placed Pressure ulcer on the buttock culture, reposition patient, wound care follow-up-  Acute metabolic encephalopathy due to above.  Aspiration the fall precaution. Speech therapy evaluation  Rash.  Unclear etiology, Benadryl as needed.  CKD stage III.  Stable.  Diabetes.  Start sliding scale, continue Lantus  Hypertension.  Continue home hypertension meds, IV hydralazine as needed for elevated blood pressure       All the records are reviewed and case discussed with Care Management/Social Workerr.   CODE STATUS: DO NOT RESUSCITATE  TOTAL TIME TAKING CARE OF THIS PATIENT: 35 minutes.   POSSIBLE D/C IN 2DAYS, DEPENDING ON CLINICAL CONDITION.  Note: This dictation was prepared with Dragon dictation along with smaller phrase technology. Any transcriptional errors that result from this process are unintentional.   Ramonita LabGouru, Rj Pedrosa M.D on 07/13/2017 at 4:47 PM  Between 7am to 6pm - Pager - [phone] After 6pm go to [email or web address] - password [email or web address] [facility]  [facility]  Office  [phone]  CC: Primary care physician; Leanna SatoMiles, Linda M, MD

## 2017-07-13 NOTE — [place] FL2 (Signed)
[place] MEDICAID FL2 LEVEL OF CARE SCREENING TOOL     IDENTIFICATION  Patient Name: Timothy Hays Birthdate: 10/04/1918 Sex: male Admission Date (Current Location): 07/12/2017  [facility] [place]   Facility and Address:  [facility], [address], [place], [place] [address]      Provider Number: [id number]  Attending Physician Name and Address:  Ramonita LabGouru, Aruna, MD  Relative Name and Phone Number:       Current Level of Care: Hospital Recommended Level of Care: Assisted Living Facility Prior Approval Number:    Date Approved/Denied:   PASRR Number:    Discharge Plan: (ALF)    Current Diagnoses: Patient Active Problem List   Diagnosis Date Noted  . 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 Hyperglycemia 03/16/2017  . Pressure injury of skin 03/16/2017  . Acute CHF (HCC) 12/18/2016  . Acute CHF (congestive heart failure) (HCC) 12/18/2016  . Sepsis (HCC) 09/02/2016    Orientation RESPIRATION BLADDER Height & Weight        Normal Incontinent Weight: 221 lb 4.8 oz (100.4 kg) Height:  5\' 9"  (175.3 cm)  BEHAVIORAL SYMPTOMS/MOOD NEUROLOGICAL BOWEL NUTRITION STATUS      Continent    AMBULATORY STATUS COMMUNICATION OF NEEDS Skin     Verbally Normal                       Personal Care Assistance Level of Assistance              Functional Limitations Info             SPECIAL CARE FACTORS FREQUENCY                       Contractures Contractures Info: Not present    Additional Factors Info  Code Status, Allergies Code Status Info: dnr Allergies Info: fish allergy; keflex           Current Medications (07/13/2017):  This is the current hospital active medication list Current Facility-Administered Medications  Medication Dose Route Frequency Provider Last Rate Last Dose  . 0.9 %  sodium chloride infusion   Intravenous Continuous Shaune Pollackhen, Qing, MD 50 mL/hr at 07/12/17 2303     . acetaminophen (TYLENOL) tablet 650 mg  650 mg Oral Q6H PRN Shaune Pollackhen, Qing, MD       Or  . acetaminophen (TYLENOL) suppository 650 mg  650 mg Rectal Q6H PRN Shaune Pollackhen, Qing, MD      . albuterol (PROVENTIL) (2.5 MG/3ML) 0.083% nebulizer solution 2.5 mg  2.5 mg Nebulization Q2H PRN Shaune Pollackhen, Qing, MD      . bisacodyl (DULCOLAX) EC tablet 5 mg  5 mg Oral Daily PRN Shaune Pollackhen, Qing, MD      . budesonide (PULMICORT) nebulizer solution 0.5 mg  0.5 mg Nebulization BID Shaune Pollackhen, Qing, MD   0.5 mg at 07/13/17 0905  . diphenhydrAMINE (BENADRYL) capsule 25 mg  25 mg Oral Q6H PRN Shaune Pollackhen, Qing, MD      . docusate sodium (COLACE) capsule 100 mg  100 mg Oral QHS Shaune Pollackhen, Qing, MD      . ferrous sulfate tablet 325 mg  325 mg Oral QHS Shaune Pollackhen, Qing, MD      . furosemide (LASIX) tablet 40 mg  40 mg Oral Daily Shaune Pollackhen, Qing, MD   40 mg at 07/13/17 1127  . gabapentin (NEURONTIN) capsule 200-300 mg  200-300 mg Oral TID Shaune Pollackhen, Qing, MD  300 mg at 07/13/17 1127  . guaiFENesin-dextromethorphan (ROBITUSSIN DM) 100-10 MG/5ML syrup 10 mL  10 mL Oral Q4H PRN Shaune Pollack, MD      . heparin injection 5,000 Units  5,000 Units Subcutaneous Q8H Shaune Pollack, MD   5,000 Units at 07/13/17 1317  . insulin aspart (novoLOG) injection 4 Units  4 Units Subcutaneous TID WC Shaune Pollack, MD   4 Units at 07/13/17 1318  . insulin glargine (LANTUS) injection 18 Units  18 Units Subcutaneous Daily Shaune Pollack, MD   18 Units at 07/13/17 1128  . latanoprost (XALATAN) 0.005 % ophthalmic solution 1 drop  1 drop Right Eye Boyce Medici, Alfredo Batty, MD      . levofloxacin [facility]) IVPB 750 mg  750 mg Intravenous Q48H Shaune Pollack, MD   Stopped at 07/13/17 0033  . MEDLINE mouth rinse  15 mL Mouth Rinse BID Shaune Pollack, MD      . metoprolol tartrate (LOPRESSOR) tablet 12.5 mg  12.5 mg Oral BID Shaune Pollack, MD   12.5 mg at 07/13/17 1128  . nitroGLYCERIN (NITROSTAT) SL tablet 0.4 mg  0.4 mg Sublingual Q5 min PRN Shaune Pollack, MD      . nystatin (MYCOSTATIN/NYSTOP) topical powder   Topical QID  Mick Sell, MD      . ondansetron [facility]) tablet 4 mg  4 mg Oral Q6H PRN Shaune Pollack, MD       Or  . ondansetron [facility]) injection 4 mg  4 mg Intravenous Q6H PRN Shaune Pollack, MD      . pantoprazole (PROTONIX) EC tablet 40 mg  40 mg Oral QAC breakfast Shaune Pollack, MD   40 mg at 07/13/17 0805  . polyethylene glycol (MIRALAX / GLYCOLAX) packet 17 g  17 g Oral Daily PRN Shaune Pollack, MD      . senna-docusate (Senokot-S) tablet 1 tablet  1 tablet Oral QHS PRN Shaune Pollack, MD         Discharge Medications: Please see discharge summary for a list of discharge medications.  Relevant Imaging Results:  Relevant Lab Results:   Additional Information    York Spaniel, LCSW

## 2017-07-13 NOTE — Consult Note (Signed)
[facility] Infectious Disease     Reason for Consult: Bacteremia    Referring Physician: Gouru, A Date of Admission:  07/12/2017   Active Problems:   Sepsis ([place])   Stage III pressure ulcer of sacral region [facility])   HPI: Timothy Hays is a 83 y.o. male admitted with AMS and weakness increasing over a week.  On admission he was unresponsive and had a wbc of 11.2, no fevers but had a rash and a UA with TNTC WBC.   Bcx + staph species. CXR showed CHF.  He was started on abx and received levofloxacin, aztreonam, vanco and meropenem. CLinically improving with no fevers and more alert. He is a poor historian but does report dysuria.    Past Medical History:  Diagnosis Date  . Anginal pain ([place])   . CAD (coronary artery disease)   . CKD (chronic kidney disease), stage III ([place])   . Diabetes mellitus without complication ([place])   . Dyspnea   . GERD (gastroesophageal reflux disease)   . History of gout   . Hyperlipidemia   . Hypertension   . Myocardial infarction ([place])   . Peripheral vascular disease ([place])    History reviewed. No pertinent surgical history. Social History   Tobacco Use  . Smoking status: Never Smoker  . Smokeless tobacco: Never Used  Substance Use Topics  . Alcohol use: No  . Drug use: No   Family History  Problem Relation Age of Onset  . Hypertension Father   . Diabetes Neg Hx     Allergies:  Allergies  Allergen Reactions  . Fish Allergy Anaphylaxis  . Keflex [Cephalexin] Nausea And Vomiting    Daughter advises that his vomiting is extremely "voilent and severe".  . Morphine And Related Nausea And Vomiting  . Penicillins     Has patient had a PCN reaction causing immediate rash, facial/tongue/throat swelling,} If all of the above answers are "NO", then may proceed with Cephalosporin use. SOB or lightheadedness with hypotension: No Has patient had a PCN reaction causing severe rash involving mucus membranes or skin necrosis: Unknown Has patient had a  PCN reaction that required hospitalization Unknown Has patient had a PCN reaction occurring within the last 10 years: No     Current antibiotics: Antibiotics Given (last 72 hours)    Date/Time Action Medication Dose Rate   07/12/17 1843 New Bag/Given   meropenem (MERREM) 2 g in sodium chloride 0.9 % 100 mL IVPB 2 g 200 mL/hr   07/12/17 1850 New Bag/Given   vancomycin (VANCOCIN) IVPB 1000 mg/200 mL premix 1,000 mg 200 mL/hr   07/12/17 2303 New Bag/Given   levofloxacin (LEVAQUIN) IVPB 750 mg 750 mg 100 mL/hr   07/12/17 2303 New Bag/Given   aztreonam (AZACTAM) 1 g in dextrose 5 % 50 mL IVPB 1 g 100 mL/hr   07/13/17 2094 New Bag/Given   aztreonam (AZACTAM) 1 g in dextrose 5 % 50 mL IVPB 1 g 100 mL/hr   07/13/17 1318 New Bag/Given   aztreonam (AZACTAM) 1 g in dextrose 5 % 50 mL IVPB 1 g 100 mL/hr      MEDICATIONS: . budesonide  0.5 mg Nebulization BID  . docusate sodium  100 mg Oral QHS  . ferrous sulfate  325 mg Oral QHS  . furosemide  40 mg Oral Daily  . gabapentin  200-300 mg Oral TID  . heparin  5,000 Units Subcutaneous Q8H  . insulin aspart  4 Units Subcutaneous TID WC  . insulin  glargine  18 Units Subcutaneous Daily  . latanoprost  1 drop Right Eye QHS  . mouth rinse  15 mL Mouth Rinse BID  . metoprolol tartrate  12.5 mg Oral BID  . pantoprazole  40 mg Oral QAC breakfast    Review of Systems - 11 systems reviewed and negative per HPI   OBJECTIVE: Temp:  [97.3 F (36.3 C)-98 F (36.7 C)] 97.8 F (36.6 C) (11/05 1259) Pulse Rate:  [66-91] 66 (11/05 1259) Resp:  [17-20] 20 (11/05 1259) BP: (121-180)/(56-79) 178/79 (11/05 1259) SpO2:  [92 %-100 %] 92 % (11/05 1259) Weight:  [95.3 kg (210 lb)-100.4 kg (221 lb 4.8 oz)] 100.4 kg (221 lb 4.8 oz) (11/05 1027) Physical Exam  Constitutional: He is awake, very HOH, able to converse and follow simple commands HENT: anicteric Mouth/Throat: Oropharynx is clear and moist. No oropharyngeal exudate.  Cardiovascular: Normal rate,  regular rhythm and normal heart sounds. Pulmonary/Chest: Effort normal and breath sounds normal. No respiratory distress. He has no wheezes.  Abdominal: Soft. Obese. GU - scrotal erythema and candidal infection  Lymphadenopathy: He has no cervical adenopathy.  Neurological: He is alert and oriented to person, place, and time.  Skin: He has a fine macular eruption on legs and chest. Psychiatric: He has a normal mood and affect. His behavior is normal.   LABS: Results for orders placed or performed during the hospital encounter of 07/12/17 (from the past 48 hour(s))  Comprehensive metabolic panel     Status: Abnormal   Collection Time: 07/12/17  5:29 PM  Result Value Ref Range   Sodium 135 135 - 145 mmol/L   Potassium 4.1 3.5 - 5.1 mmol/L   Chloride 98 (L) 101 - 111 mmol/L   CO2 29 22 - 32 mmol/L   Glucose, Bld 221 (H) 65 - 99 mg/dL   BUN 35 (H) 6 - 20 mg/dL   Creatinine, Ser 1.38 (H) 0.61 - 1.24 mg/dL   Calcium 8.4 (L) 8.9 - 10.3 mg/dL   Total Protein 7.1 6.5 - 8.1 g/dL   Albumin 2.8 (L) 3.5 - 5.0 g/dL   AST 13 (L) 15 - 41 U/L   ALT 8 (L) 17 - 63 U/L   Alkaline Phosphatase 49 38 - 126 U/L   Total Bilirubin 0.4 0.3 - 1.2 mg/dL   GFR calc non Af Amer 42 (L) >60 mL/min   GFR calc Af Amer 48 (L) >60 mL/min    Comment: (NOTE) The eGFR has been calculated using the CKD EPI equation. This calculation has not been validated in all clinical situations. eGFR's persistently <60 mL/min signify possible Chronic Kidney Disease.    Anion gap 8 5 - 15  CBC     Status: Abnormal   Collection Time: 07/12/17  5:29 PM  Result Value Ref Range   WBC 11.2 (H) 3.8 - 10.6 K/uL   RBC 3.98 (L) 4.40 - 5.90 MIL/uL   Hemoglobin 11.0 (L) 13.0 - 18.0 g/dL   HCT 35.0 (L) 40.0 - 52.0 %   MCV 87.8 80.0 - 100.0 fL   MCH 27.6 26.0 - 34.0 pg   MCHC 31.4 (L) 32.0 - 36.0 g/dL   RDW 18.0 (H) 11.5 - 14.5 %   Platelets 226 150 - 440 K/uL  Urinalysis, Complete w Microscopic     Status: Abnormal   Collection Time:  07/12/17  5:29 PM  Result Value Ref Range   Color, Urine YELLOW (A) YELLOW   APPearance CLOUDY (A) CLEAR   Specific Gravity,  Urine 1.012 1.005 - 1.030   pH 5.0 5.0 - 8.0   Glucose, UA NEGATIVE NEGATIVE mg/dL   Hgb urine dipstick SMALL (A) NEGATIVE   Bilirubin Urine NEGATIVE NEGATIVE   Ketones, ur NEGATIVE NEGATIVE mg/dL   Protein, ur 30 (A) NEGATIVE mg/dL   Nitrite NEGATIVE NEGATIVE   Leukocytes, UA LARGE (A) NEGATIVE   RBC / HPF 6-30 0 - 5 RBC/hpf   WBC, UA TOO NUMEROUS TO COUNT 0 - 5 WBC/hpf   Bacteria, UA MANY (A) NONE SEEN   Squamous Epithelial / LPF 0-5 (A) NONE SEEN   WBC Clumps PRESENT    Mucus PRESENT   Lactic acid, plasma     Status: None   Collection Time: 07/12/17  5:29 PM  Result Value Ref Range   Lactic Acid, Venous 1.8 0.5 - 1.9 mmol/L  Lipase, blood     Status: None   Collection Time: 07/12/17  5:29 PM  Result Value Ref Range   Lipase 13 11 - 51 U/L  Brain natriuretic peptide - IF patient is dyspneic     Status: Abnormal   Collection Time: 07/12/17  5:29 PM  Result Value Ref Range   B Natriuretic Peptide 337.0 (H) 0.0 - 100.0 pg/mL  Troponin I     Status: None   Collection Time: 07/12/17  5:29 PM  Result Value Ref Range   Troponin I <0.03 <0.03 ng/mL  Procalcitonin     Status: None   Collection Time: 07/12/17  5:29 PM  Result Value Ref Range   Procalcitonin 0.10 ng/mL    Comment:        Interpretation: PCT (Procalcitonin) <= 0.5 ng/mL: Systemic infection (sepsis) is not likely. Local bacterial infection is possible. (NOTE)         ICU PCT Algorithm               Non ICU PCT Algorithm    ----------------------------     ------------------------------         PCT < 0.25 ng/mL                 PCT < 0.1 ng/mL     Stopping of antibiotics            Stopping of antibiotics       strongly encouraged.               strongly encouraged.    ----------------------------     ------------------------------       PCT level decrease by               PCT < 0.25  ng/mL       >= 80% from peak PCT       OR PCT 0.25 - 0.5 ng/mL          Stopping of antibiotics                                             encouraged.     Stopping of antibiotics           encouraged.    ----------------------------     ------------------------------       PCT level decrease by              PCT >= 0.25 ng/mL       < 80% from peak PCT  AND PCT >= 0.5 ng/mL            Continuin g antibiotics                                              encouraged.       Continuing antibiotics            encouraged.    ----------------------------     ------------------------------     PCT level increase compared          PCT > 0.5 ng/mL         with peak PCT AND          PCT >= 0.5 ng/mL             Escalation of antibiotics                                          strongly encouraged.      Escalation of antibiotics        strongly encouraged.   Protime-INR     Status: None   Collection Time: 07/12/17  5:29 PM  Result Value Ref Range   Prothrombin Time 13.9 11.4 - 15.2 seconds   INR 1.08   Blood Culture (routine x 2)     Status: None (Preliminary result)   Collection Time: 07/12/17  5:29 PM  Result Value Ref Range   Specimen Description BLOOD BLOOD RIGHT FOREARM    Special Requests      BOTTLES DRAWN AEROBIC AND ANAEROBIC Blood Culture results may not be optimal due to an excessive volume of blood received in culture bottles   Culture NO GROWTH < 12 HOURS    Report Status PENDING   Blood Culture (routine x 2)     Status: None (Preliminary result)   Collection Time: 07/12/17  5:29 PM  Result Value Ref Range   Specimen Description BLOOD BLOOD LEFT FOREARM    Special Requests      BOTTLES DRAWN AEROBIC AND ANAEROBIC Blood Culture adequate volume   Culture  Setup Time      Organism ID to follow GRAM POSITIVE COCCI IN BOTH AEROBIC AND ANAEROBIC BOTTLES CRITICAL RESULT CALLED TO, READ BACK BY AND VERIFIED WITH: KAREN HAYES ON 07/13/17 AT 1410 JAG    Culture GRAM POSITIVE  COCCI    Report Status PENDING   Differential     Status: Abnormal   Collection Time: 07/12/17  5:29 PM  Result Value Ref Range   Neutrophils Relative % 78 %   Neutro Abs 9.3 (H) 1.4 - 6.5 K/uL   Lymphocytes Relative 7 %   Lymphs Abs 0.8 (L) 1.0 - 3.6 K/uL   Monocytes Relative 8 %   Monocytes Absolute 0.9 0.2 - 1.0 K/uL   Eosinophils Relative 7 %   Eosinophils Absolute 0.8 (H) 0 - 0.7 K/uL   Basophils Relative 0 %   Basophils Absolute 0.0 0 - 0.1 K/uL  Blood Culture ID Panel (Reflexed)     Status: Abnormal   Collection Time: 07/12/17  5:29 PM  Result Value Ref Range   Enterococcus species NOT DETECTED NOT DETECTED   Listeria monocytogenes NOT DETECTED NOT DETECTED   Staphylococcus species DETECTED (A) NOT DETECTED    Comment: Methicillin (oxacillin) susceptible  coagulase negative staphylococcus. Possible blood culture contaminant (unless isolated from more than one blood culture draw or clinical case suggests pathogenicity). No antibiotic treatment is indicated for blood  culture contaminants. CRITICAL RESULT CALLED TO, READ BACK BY AND VERIFIED WITH: KAREN HAYES ON 07/13/17 AT 70 JAG    Staphylococcus aureus NOT DETECTED NOT DETECTED   Methicillin resistance NOT DETECTED NOT DETECTED   Streptococcus species NOT DETECTED NOT DETECTED   Streptococcus agalactiae NOT DETECTED NOT DETECTED   Streptococcus pneumoniae NOT DETECTED NOT DETECTED   Streptococcus pyogenes NOT DETECTED NOT DETECTED   Acinetobacter baumannii NOT DETECTED NOT DETECTED   Enterobacteriaceae species NOT DETECTED NOT DETECTED   Enterobacter cloacae complex NOT DETECTED NOT DETECTED   Escherichia coli NOT DETECTED NOT DETECTED   Klebsiella oxytoca NOT DETECTED NOT DETECTED   Klebsiella pneumoniae NOT DETECTED NOT DETECTED   Proteus species NOT DETECTED NOT DETECTED   Serratia marcescens NOT DETECTED NOT DETECTED   Haemophilus influenzae NOT DETECTED NOT DETECTED   Neisseria meningitidis NOT DETECTED NOT  DETECTED   Pseudomonas aeruginosa NOT DETECTED NOT DETECTED   Candida albicans NOT DETECTED NOT DETECTED   Candida glabrata NOT DETECTED NOT DETECTED   Candida krusei NOT DETECTED NOT DETECTED   Candida parapsilosis NOT DETECTED NOT DETECTED   Candida tropicalis NOT DETECTED NOT DETECTED  MRSA PCR Screening     Status: None   Collection Time: 07/12/17  9:43 PM  Result Value Ref Range   MRSA by PCR NEGATIVE NEGATIVE    Comment:        The GeneXpert MRSA Assay (FDA approved for NASAL specimens only), is one component of a comprehensive MRSA colonization surveillance program. It is not intended to diagnose MRSA infection nor to guide or monitor treatment for MRSA infections.   Glucose, capillary     Status: Abnormal   Collection Time: 07/12/17 10:37 PM  Result Value Ref Range   Glucose-Capillary 292 (H) 65 - 99 mg/dL   Comment 1 Notify RN   Basic metabolic panel     Status: Abnormal   Collection Time: 07/13/17  4:20 AM  Result Value Ref Range   Sodium 135 135 - 145 mmol/L   Potassium 5.1 3.5 - 5.1 mmol/L   Chloride 98 (L) 101 - 111 mmol/L   CO2 29 22 - 32 mmol/L   Glucose, Bld 379 (H) 65 - 99 mg/dL   BUN 33 (H) 6 - 20 mg/dL   Creatinine, Ser 1.19 0.61 - 1.24 mg/dL   Calcium 8.6 (L) 8.9 - 10.3 mg/dL   GFR calc non Af Amer 50 (L) >60 mL/min   GFR calc Af Amer 58 (L) >60 mL/min    Comment: (NOTE) The eGFR has been calculated using the CKD EPI equation. This calculation has not been validated in all clinical situations. eGFR's persistently <60 mL/min signify possible Chronic Kidney Disease.    Anion gap 8 5 - 15  CBC     Status: Abnormal   Collection Time: 07/13/17  4:20 AM  Result Value Ref Range   WBC 8.8 3.8 - 10.6 K/uL   RBC 4.01 (L) 4.40 - 5.90 MIL/uL   Hemoglobin 11.4 (L) 13.0 - 18.0 g/dL   HCT 35.4 (L) 40.0 - 52.0 %   MCV 88.1 80.0 - 100.0 fL   MCH 28.3 26.0 - 34.0 pg   MCHC 32.1 32.0 - 36.0 g/dL   RDW 17.9 (H) 11.5 - 14.5 %   Platelets 217 150 - 440 K/uL   Glucose,  capillary     Status: Abnormal   Collection Time: 07/13/17  7:50 AM  Result Value Ref Range   Glucose-Capillary 338 (H) 65 - 99 mg/dL  Glucose, capillary     Status: Abnormal   Collection Time: 07/13/17 11:47 AM  Result Value Ref Range   Glucose-Capillary 321 (H) 65 - 99 mg/dL   No components found for: ESR, C REACTIVE PROTEIN MICRO: Recent Results (from the past 720 hour(s))  Blood Culture (routine x 2)     Status: None (Preliminary result)   Collection Time: 07/12/17  5:29 PM  Result Value Ref Range Status   Specimen Description BLOOD BLOOD RIGHT FOREARM  Final   Special Requests   Final    BOTTLES DRAWN AEROBIC AND ANAEROBIC Blood Culture results may not be optimal due to an excessive volume of blood received in culture bottles   Culture NO GROWTH < 12 HOURS  Final   Report Status PENDING  Incomplete  Blood Culture (routine x 2)     Status: None (Preliminary result)   Collection Time: 07/12/17  5:29 PM  Result Value Ref Range Status   Specimen Description BLOOD BLOOD LEFT FOREARM  Final   Special Requests   Final    BOTTLES DRAWN AEROBIC AND ANAEROBIC Blood Culture adequate volume   Culture  Setup Time   Final    Organism ID to follow GRAM POSITIVE COCCI IN BOTH AEROBIC AND ANAEROBIC BOTTLES CRITICAL RESULT CALLED TO, READ BACK BY AND VERIFIED WITH: KAREN HAYES ON 07/13/17 AT 60 JAG    Culture GRAM POSITIVE COCCI  Final   Report Status PENDING  Incomplete  Blood Culture ID Panel (Reflexed)     Status: Abnormal   Collection Time: 07/12/17  5:29 PM  Result Value Ref Range Status   Enterococcus species NOT DETECTED NOT DETECTED Final   Listeria monocytogenes NOT DETECTED NOT DETECTED Final   Staphylococcus species DETECTED (A) NOT DETECTED Final    Comment: Methicillin (oxacillin) susceptible coagulase negative staphylococcus. Possible blood culture contaminant (unless isolated from more than one blood culture draw or clinical case suggests pathogenicity). No  antibiotic treatment is indicated for blood  culture contaminants. CRITICAL RESULT CALLED TO, READ BACK BY AND VERIFIED WITH: KAREN HAYES ON 07/13/17 AT 3 JAG    Staphylococcus aureus NOT DETECTED NOT DETECTED Final   Methicillin resistance NOT DETECTED NOT DETECTED Final   Streptococcus species NOT DETECTED NOT DETECTED Final   Streptococcus agalactiae NOT DETECTED NOT DETECTED Final   Streptococcus pneumoniae NOT DETECTED NOT DETECTED Final   Streptococcus pyogenes NOT DETECTED NOT DETECTED Final   Acinetobacter baumannii NOT DETECTED NOT DETECTED Final   Enterobacteriaceae species NOT DETECTED NOT DETECTED Final   Enterobacter cloacae complex NOT DETECTED NOT DETECTED Final   Escherichia coli NOT DETECTED NOT DETECTED Final   Klebsiella oxytoca NOT DETECTED NOT DETECTED Final   Klebsiella pneumoniae NOT DETECTED NOT DETECTED Final   Proteus species NOT DETECTED NOT DETECTED Final   Serratia marcescens NOT DETECTED NOT DETECTED Final   Haemophilus influenzae NOT DETECTED NOT DETECTED Final   Neisseria meningitidis NOT DETECTED NOT DETECTED Final   Pseudomonas aeruginosa NOT DETECTED NOT DETECTED Final   Candida albicans NOT DETECTED NOT DETECTED Final   Candida glabrata NOT DETECTED NOT DETECTED Final   Candida krusei NOT DETECTED NOT DETECTED Final   Candida parapsilosis NOT DETECTED NOT DETECTED Final   Candida tropicalis NOT DETECTED NOT DETECTED Final  MRSA PCR Screening     Status: None  Collection Time: 07/12/17  9:43 PM  Result Value Ref Range Status   MRSA by PCR NEGATIVE NEGATIVE Final    Comment:        The GeneXpert MRSA Assay (FDA approved for NASAL specimens only), is one component of a comprehensive MRSA colonization surveillance program. It is not intended to diagnose MRSA infection nor to guide or monitor treatment for MRSA infections.     IMAGING: Dg Chest Port 1 View  Result Date: 07/12/2017 CLINICAL DATA:  Pt presents to ED via [facility] from [place] c/o AMS and rash. EMS report they were initially called out for unresponsiveness. Upon their arrival sternal rub was performed and pt regained consciousnessHx/o CAD, CKD, HTN, and MI EXAM: PORTABLE CHEST 1 VIEW COMPARISON:  03/16/2017 FINDINGS: Cardiac silhouette is mildly enlarged. No convincing mediastinal or hilar masses. There is central vascular congestion with bilateral interstitial thickening and intervening hazy airspace opacity. This is greater in the lower lungs. There may be small effusions. No pneumothorax. Skeletal structures are demineralized but grossly intact. IMPRESSION: 1. Findings consistent with congestive heart failure. No convincing pneumonia. Electronically Signed   By: Lajean Manes M.D.   On: 07/12/2017 18:36    Assessment:   Timothy Hays is a 83 y.o. male admitted with AMS and found to have UA with WBC TNTC.  BCX + Staph species but likely contaminant as in only one set. UCx is pending. Clincially improving.   Recommendations Would continue levofloxacin pending urine culture. Can stop Vanco unless second [place] set +. Nystatin powder to candidal infection in groin. Thank you very much for allowing me to participate in the care of this patient. Please call with questions.   Cheral Marker. Ola Spurr, MD

## 2017-07-13 NOTE — Care Management (Signed)
Patient admitted with sepsis due to UTI. Patent presents from [facility].  Per previous assessment patient is WC bound at baseline and facility uses a hoyer lift. [name] open with [facility].  Clydie BraunKaren with Hospice is notified of admission.  Patient has chronic O2, and hospital bed at the facility.  CSW updated.

## 2017-07-14 LAB — GLUCOSE, CAPILLARY
GLUCOSE-CAPILLARY: 184 mg/dL — AB (ref 65–99)
Glucose-Capillary: 231 mg/dL — ABNORMAL HIGH (ref 65–99)
Glucose-Capillary: 207 mg/dL — ABNORMAL HIGH (ref 65–99)
Glucose-Capillary: 207 mg/dL — ABNORMAL HIGH (ref 65–99)

## 2017-07-14 MED ORDER — DEXTROSE 5 % IV SOLN
1.0000 g | INTRAVENOUS | Status: DC
  Administered 2017-07-14: 1 g via INTRAVENOUS
  Filled 2017-07-14 (×2): qty 10

## 2017-07-14 MED ORDER — SODIUM POLYSTYRENE SULFONATE 15 GM/60ML PO SUSP
15.0000 g | Freq: Once | ORAL | Status: AC
Start: 2017-07-14 — End: 2017-07-14
  Administered 2017-07-14: 13:00:00 15 g via ORAL
  Filled 2017-07-14: qty 60

## 2017-07-14 NOTE — Care Management Important Message (Signed)
Important Message  Patient Details  Name: [name] MRN: [id number] Date of Birth: 01/17/1919   Medicare Important Message Given:  Yes    Gwenette GreetBrenda S Cella Cappello, RN 07/14/2017, 7:02 AM

## 2017-07-14 NOTE — Progress Notes (Signed)
Inpatient Diabetes Program Recommendations  AACE/ADA: New Consensus Statement on Inpatient Glycemic Control (2015)  Target Ranges:  Prepandial:   less than 140 mg/dL      Peak postprandial:   less than 180 mg/dL (1-2 hours)      Critically ill patients:  140 - 180 mg/dL   Results for Timothy Hays, Timothy Hays (MRN [id number]) as of 07/14/2017 13:56  Ref. Range 07/13/2017 21:23 07/14/2017 07:34 07/14/2017 11:55  Glucose-Capillary Latest Ref Range: 65 - 99 mg/dL 409270 (H) 811231 (H) 914207 (H)   Review of Glycemic Control  Diabetes history: DM 2 Home DM Meds: Lantus 18 units daily                             Novolog 4 units TID  Current Insulin Orders: Lantus 18 units daily                                       Novolog 4 units TID    Inpatient Diabetes Program Recommendations:    Glucose elevated in the 200's after the addition of Lantus. Please consider Novolog Sensitive Correction 0-9 units tid.  Thanks,  Christena DeemShannon Lon Klippel RN, MSN, [facility] Inpatient Diabetes Coordinator Team Pager [phone] (8a-5p)

## 2017-07-14 NOTE — Progress Notes (Signed)
Visit made. Patient seen sitting up in bed, nebulizer treatment in place. After treatment, writer asked patient if he was having any pain, he reported that it "hurts when I wee", he is incontinent of urine at [place]. He is currently receiving IV antibiotics for a UTI. Pain reported to staff RN Martie LeeSkyler and attending physician Dr. Amado CoeGouru. Staff aide Debbie in to change patient and reported same pain to her.Debbie did assess for any rash or redness to perineal area and reports there is none. Writer to follow up to be sure this is treated. Will continue to follow and update hospice team. Thank you. Dayna BarkerKaren Robertson RN, BSN, [facility], hospital liaison [phone]

## 2017-07-14 NOTE — Progress Notes (Signed)
[facility] CLINIC INFECTIOUS DISEASE PROGRESS NOTE Date of Admission:  07/12/2017     ID: Timothy Hays is a 83 y.o. male with UTI Active Problems:   Sepsis (HCC)   Stage III pressure ulcer of sacral region [facility])   Subjective: No fevers, UCX e coli  ROS  Eleven systems are reviewed and negative except per hpi  Medications:  Antibiotics Given (last 72 hours)    Date/Time Action Medication Dose Rate   07/12/17 1843 New Bag/Given   meropenem (MERREM) 2 g in sodium chloride 0.9 % 100 mL IVPB 2 g 200 mL/hr   07/12/17 1850 New Bag/Given   vancomycin (VANCOCIN) IVPB 1000 mg/200 mL premix 1,000 mg 200 mL/hr   07/12/17 2303 New Bag/Given   levofloxacin (LEVAQUIN) IVPB 750 mg 750 mg 100 mL/hr   07/12/17 2303 New Bag/Given   aztreonam (AZACTAM) 1 g in dextrose 5 % 50 mL IVPB 1 g 100 mL/hr   07/13/17 6962 New Bag/Given   aztreonam (AZACTAM) 1 g in dextrose 5 % 50 mL IVPB 1 g 100 mL/hr   07/13/17 1318 New Bag/Given   aztreonam (AZACTAM) 1 g in dextrose 5 % 50 mL IVPB 1 g 100 mL/hr     . budesonide  0.5 mg Nebulization BID  . docusate sodium  100 mg Oral QHS  . feeding supplement (ENSURE ENLIVE)  237 mL Oral BID WC  . ferrous sulfate  325 mg Oral QHS  . furosemide  40 mg Oral Daily  . gabapentin  200-300 mg Oral TID  . heparin  5,000 Units Subcutaneous Q8H  . insulin aspart  4 Units Subcutaneous TID WC  . insulin glargine  18 Units Subcutaneous Daily  . latanoprost  1 drop Right Eye QHS  . mouth rinse  15 mL Mouth Rinse BID  . metoprolol tartrate  12.5 mg Oral BID  . nystatin   Topical QID  . pantoprazole  40 mg Oral QAC breakfast    Objective: Vital signs in last 24 hours: Temp:  [97.5 F (36.4 C)-98 F (36.7 C)] 97.5 F (36.4 C) (11/06 1404) Pulse Rate:  [64-71] 71 (11/06 1404) Resp:  [18-20] 18 (11/06 1404) BP: (115-159)/(61-68) 115/61 (11/06 1404) SpO2:  [90 %-98 %] 98 % (11/06 1404) Constitutional: He is awake, very HOH, able to converse and follow simple  commands HENT: anicteric Mouth/Throat: Oropharynx is clear and moist. No oropharyngeal exudate.  Cardiovascular: Normal rate, regular rhythm and normal heart sounds. Pulmonary/Chest: Effort normal and breath sounds normal. No respiratory distress. He has no wheezes.  Abdominal: Soft. Obese. GU - scrotal erythema and candidal infection  Lymphadenopathy: He has no cervical adenopathy.  Neurological: He is alert and oriented to person, place, and time.  Skin: He has a fine macular eruption on legs and chest. Psychiatric: He has a normal mood and affect. His behavior is normal.   Lab Results Recent Labs    07/12/17 1729 07/13/17 0420  WBC 11.2* 8.8  HGB 11.0* 11.4*  HCT 35.0* 35.4*  NA 135 135  K 4.1 5.1  CL 98* 98*  CO2 29 29  BUN 35* 33*  CREATININE 1.38* 1.19    Microbiology: Results for orders placed or performed during the hospital encounter of 07/12/17  Urine culture     Status: Abnormal (Preliminary result)   Collection Time: 07/12/17  5:29 PM  Result Value Ref Range Status   Specimen Description URINE, CATHETERIZED  Final   Special Requests NONE  Final   Culture >=100,000 COLONIES/mL  ESCHERICHIA COLI (A)  Final   Report Status PENDING  Incomplete  Blood Culture (routine x 2)     Status: None (Preliminary result)   Collection Time: 07/12/17  5:29 PM  Result Value Ref Range Status   Specimen Description BLOOD BLOOD RIGHT FOREARM  Final   Special Requests   Final    BOTTLES DRAWN AEROBIC AND ANAEROBIC Blood Culture results may not be optimal due to an excessive volume of blood received in culture bottles   Culture NO GROWTH 2 DAYS  Final   Report Status PENDING  Incomplete  Blood Culture (routine x 2)     Status: Abnormal (Preliminary result)   Collection Time: 07/12/17  5:29 PM  Result Value Ref Range Status   Specimen Description BLOOD BLOOD LEFT FOREARM  Final   Special Requests   Final    BOTTLES DRAWN AEROBIC AND ANAEROBIC Blood Culture adequate volume    Culture  Setup Time   Final    GRAM POSITIVE COCCI IN BOTH AEROBIC AND ANAEROBIC BOTTLES CRITICAL RESULT CALLED TO, READ BACK BY AND VERIFIED WITH: KAREN HAYES ON 07/13/17 AT 1410 JAG GRAM STAIN REVIEWED-AGREE WITH RESULT    Culture (A)  Final    STAPHYLOCOCCUS SPECIES (COAGULASE NEGATIVE) THE SIGNIFICANCE OF ISOLATING THIS ORGANISM FROM A SINGLE SET OF BLOOD CULTURES WHEN MULTIPLE SETS ARE DRAWN IS UNCERTAIN. PLEASE NOTIFY THE MICROBIOLOGY DEPARTMENT WITHIN ONE WEEK IF SPECIATION AND SENSITIVITIES ARE REQUIRED. Performed at [facility], [address] [place], [place] [address]    Report Status PENDING  Incomplete  Blood Culture ID Panel (Reflexed)     Status: Abnormal   Collection Time: 07/12/17  5:29 PM  Result Value Ref Range Status   Enterococcus species NOT DETECTED NOT DETECTED Final   Listeria monocytogenes NOT DETECTED NOT DETECTED Final   Staphylococcus species DETECTED (A) NOT DETECTED Final    Comment: Methicillin (oxacillin) susceptible coagulase negative staphylococcus. Possible blood culture contaminant (unless isolated from more than one blood culture draw or clinical case suggests pathogenicity). No antibiotic treatment is indicated for blood  culture contaminants. CRITICAL RESULT CALLED TO, READ BACK BY AND VERIFIED WITH: KAREN HAYES ON 07/13/17 AT 1410 JAG    Staphylococcus aureus NOT DETECTED NOT DETECTED Final   Methicillin resistance NOT DETECTED NOT DETECTED Final   Streptococcus species NOT DETECTED NOT DETECTED Final   Streptococcus agalactiae NOT DETECTED NOT DETECTED Final   Streptococcus pneumoniae NOT DETECTED NOT DETECTED Final   Streptococcus pyogenes NOT DETECTED NOT DETECTED Final   Acinetobacter baumannii NOT DETECTED NOT DETECTED Final   Enterobacteriaceae species NOT DETECTED NOT DETECTED Final   Enterobacter cloacae complex NOT DETECTED NOT DETECTED Final   Escherichia coli NOT DETECTED NOT DETECTED Final   Klebsiella oxytoca NOT DETECTED  NOT DETECTED Final   Klebsiella pneumoniae NOT DETECTED NOT DETECTED Final   Proteus species NOT DETECTED NOT DETECTED Final   Serratia marcescens NOT DETECTED NOT DETECTED Final   Haemophilus influenzae NOT DETECTED NOT DETECTED Final   Neisseria meningitidis NOT DETECTED NOT DETECTED Final   Pseudomonas aeruginosa NOT DETECTED NOT DETECTED Final   Candida albicans NOT DETECTED NOT DETECTED Final   Candida glabrata NOT DETECTED NOT DETECTED Final   Candida krusei NOT DETECTED NOT DETECTED Final   Candida parapsilosis NOT DETECTED NOT DETECTED Final   Candida tropicalis NOT DETECTED NOT DETECTED Final  MRSA PCR Screening     Status: None   Collection Time: 07/12/17  9:43 PM  Result Value Ref  Range Status   MRSA by PCR NEGATIVE NEGATIVE Final    Comment:        The GeneXpert MRSA Assay (FDA approved for NASAL specimens only), is one component of a comprehensive MRSA colonization surveillance program. It is not intended to diagnose MRSA infection nor to guide or monitor treatment for MRSA infections.     Studies/Results: Dg Chest Port 1 View  Result Date: 07/12/2017 CLINICAL DATA:  Pt [facility] ED [facility] c/o AMS and rash. EMS report they were initially called out for unresponsiveness. Upon their arrival sternal rub was performed and pt regained consciousnessHx/o CAD, CKD, HTN, and MI EXAM: PORTABLE CHEST 1 VIEW COMPARISON:  03/16/2017 FINDINGS: Cardiac silhouette is mildly enlarged. No convincing mediastinal or hilar masses. There is central vascular congestion with bilateral interstitial thickening and intervening hazy airspace opacity. This is greater in the lower lungs. There may be small effusions. No pneumothorax. Skeletal structures are demineralized but grossly intact. IMPRESSION: 1. Findings consistent with congestive heart failure. No convincing pneumonia. Electronically Signed   By: Amie Portlandavid  Ormond M.D.   On: 07/12/2017 18:36     Assessment/Plan: Timothy Hays is a 83 y.o. male admitted with AMS and found to have UA with WBC TNTC.  BCX + Staph species but likely contaminant as in only one set. UCx is E coli. Clincially improving.   Recommendations Ceftriaxone pending ucx results Nystatin powder to candidal infection in groin.  Thank you very much for the consult. Will follow with you.  Mick SellDavid P Raylie Maddison   07/14/2017, 2:31 PM

## 2017-07-14 NOTE — Progress Notes (Signed)
Pt c/o burning with urination with positive UTI. Dr. Amado Coe aware- no new medications ordered today. Pt denies burning sensation this afternoon. Hospice & ID following.

## 2017-07-14 NOTE — Progress Notes (Signed)
[facility] [place] [facility]   PATIENT NAME: Timothy HartsOscar Hays    MR#:  [id number]  DATE OF BIRTH:  07/20/1919  SUBJECTIVE:  CHIEF COMPLAINT: [name] altered, daughter at bedside  REVIEW OF SYSTEMS:  Unobtainable as he is encephalopathic  DRUG ALLERGIES:   Allergies  Allergen Reactions  . Fish Allergy Anaphylaxis  . Keflex [Cephalexin] Nausea And Vomiting    Daughter advises that his vomiting is extremely "voilent and severe".  . Morphine And Related Nausea And Vomiting  . Penicillins     Has patient had a PCN reaction causing immediate rash, facial/tongue/throat swelling,} If all of the above answers are "NO", then may proceed with Cephalosporin use. SOB or lightheadedness with hypotension: No Has patient had a PCN reaction causing severe rash involving mucus membranes or skin necrosis: Unknown Has patient had a PCN reaction that required hospitalization Unknown Has patient had a PCN reaction occurring within the last 10 years: No     VITALS:  Blood pressure (!) 159/67, pulse 64, temperature 98 F (36.7 C), temperature source Oral, resp. rate 20, height 5\' 9"  (1.753 m), weight 100.4 kg (221 lb 4.8 oz), SpO2 96 %.  PHYSICAL EXAMINATION:  GENERAL:  83 y.o.-year-old patient lying in the bed with no acute distress.  EYES: Pupils equal, round, reactive to light and accommodation. No scleral icterus. HEENT: Head atraumatic, normocephalic. Oropharynx and nasopharynx clear.  NECK:  Supple, no jugular venous distention. No thyroid enlargement, no tenderness.  LUNGS: Moderate breath sounds bilaterally, no wheezing, rales,rhonchi or crepitation. No use of accessory muscles of respiration.  CARDIOVASCULAR: S1, S2 normal. No murmurs, rubs, or gallops.  ABDOMEN: Soft, nontender, nondistended. Bowel sounds present. No organomegaly or mass.  EXTREMITIES: No pedal edema, cyanosis, or clubbing.  NEUROLOGIC: Encephalopathic PSYCHIATRIC: The patient is alert and  disoriented  sKIN: No obvious rash, lesion, or ulcer.    LABORATORY PANEL:   CBC Recent Labs  Lab 07/13/17 0420  WBC 8.8  HGB 11.4*  HCT 35.4*  PLT 217   ------------------------------------------------------------------------------------------------------------------  Chemistries  Recent Labs  Lab 07/12/17 1729 07/13/17 0420  NA 135 135  K 4.1 5.1  CL 98* 98*  CO2 29 29  GLUCOSE 221* 379*  BUN 35* 33*  CREATININE 1.38* 1.19  CALCIUM 8.4* 8.6*  AST 13*  --   ALT 8*  --   ALKPHOS 49  --   BILITOT 0.4  --    ------------------------------------------------------------------------------------------------------------------  Cardiac Enzymes Recent Labs  Lab 07/12/17 1729  TROPONINI <0.03   ------------------------------------------------------------------------------------------------------------------  RADIOLOGY:  Dg Chest Port 1 View  Result Date: 07/12/2017 CLINICAL DATA:  Pt presents [facility] ED [facility] c/o AMS and rash. EMS report they were initially called out for unresponsiveness. Upon their arrival sternal rub was performed and pt regained consciousnessHx/o CAD, CKD, HTN, and MI EXAM: PORTABLE CHEST 1 VIEW COMPARISON:  03/16/2017 FINDINGS: Cardiac silhouette is mildly enlarged. No convincing mediastinal or hilar masses. There is central vascular congestion with bilateral interstitial thickening and intervening hazy airspace opacity. This is greater in the lower lungs. There may be small effusions. No pneumothorax. Skeletal structures are demineralized but grossly intact. IMPRESSION: 1. Findings consistent with congestive heart failure. No convincing pneumonia. Electronically Signed   By: Amie Portlandavid  Ormond M.D.   On: 07/12/2017 18:36    EKG:   Orders placed or performed during the hospital encounter of 07/12/17  . EKG 12-Lead  . EKG 12-Lead    ASSESSMENT  AND PLAN:   Sepsis due to UTI. Continue antibiotics IV levofloxacin, follow-up  CBC, blood culture coag negative Staphylococcus and urine culture reveals greater than 100,000 colonies of gram-negative rods, sensitivity pending IV fluid support. MRSA PCR negative ID consult placed, appreciate their recommendations Pressure ulcer on the sacral region-stage III, reposition patient, wound care follow-up- Nystatin powder to candidal infection in the groin  Acute metabolic encephalopathy due to above.  Aspiration the fall precaution. Speech therapy evaluation  Rash.  Unclear etiology, Benadryl as needed.  CKD stage III.  Stable.  Diabetes.  Start sliding scale, continue Lantus  Hypertension.  Continue home hypertension meds, IV hydralazine as needed for elevated blood pressure       All the records are reviewed and case discussed with Care Management/Social Workerr.  Plan of care discussed with the patient's daughter Owens Sharkam Prichard CODE STATUS: DO NOT RESUSCITATE  TOTAL TIME TAKING CARE OF THIS PATIENT: 35 minutes.   POSSIBLE D/C IN 2DAYS, DEPENDING ON CLINICAL CONDITION.  Note: This dictation was prepared with Dragon dictation along with smaller phrase technology. Any transcriptional errors that result from this process are unintentional.   Ramonita LabGouru, Waller Marcussen M.D on 07/14/2017 at 11:30 AM  Between 7am to 6pm - Pager - [phone] After 6pm go to [email or web address] - password [email or web address] [facility]  [facility] [place] [facility]  Office  [phone]  CC: Primary care physician; Leanna SatoMiles, Linda M, MD

## 2017-07-15 DIAGNOSIS — N390 Urinary tract infection, site not specified: Secondary | ICD-10-CM

## 2017-07-15 LAB — CULTURE, BLOOD (ROUTINE X 2): Special Requests: ADEQUATE

## 2017-07-15 LAB — CREATININE, SERUM
CREATININE: 0.89 mg/dL (ref 0.61–1.24)
GFR calc Af Amer: >60 mL/min (ref >60)
GFR calc non Af Amer: >60 mL/min (ref >60)

## 2017-07-15 LAB — URINE CULTURE

## 2017-07-15 LAB — GLUCOSE, CAPILLARY
GLUCOSE-CAPILLARY: 169 mg/dL — AB (ref 65–99)
GLUCOSE-CAPILLARY: 244 mg/dL — AB (ref 65–99)
GLUCOSE-CAPILLARY: 180 mg/dL — AB (ref 65–99)
GLUCOSE-CAPILLARY: 121 mg/dL — AB (ref 65–99)

## 2017-07-15 MED ORDER — NYSTATIN-TRIAMCINOLONE 100000-0.1 UNIT/GM-% EX OINT: 1.0000 "application " | TOPICAL_OINTMENT | Freq: Two times a day (BID) | CUTANEOUS | 0 refills | Status: AC

## 2017-07-15 MED ORDER — NYSTATIN-TRIAMCINOLONE 100000-0.1 UNIT/GM-% EX OINT
TOPICAL_OINTMENT | Freq: Two times a day (BID) | CUTANEOUS | Status: DC
  Administered 2017-07-16: 09:00:00 via TOPICAL
  Filled 2017-07-15: qty 15

## 2017-07-15 MED ORDER — ERTAPENEM SODIUM 1 G IJ SOLR
1.0000 g | INTRAMUSCULAR | Status: DC
  Administered 2017-07-15 – 2017-07-16 (×2): 1 g via INTRAVENOUS
  Filled 2017-07-15 (×3): qty 1

## 2017-07-15 NOTE — Progress Notes (Signed)
[facility] [place] at [facility]   PATIENT NAME: Timothy Hays    MR#:  [id number]  DATE OF BIRTH:  07/30/1942  SUBJECTIVE:  CHIEF COMPLAINT: [name] altered, daughter at bedside  REVIEW OF SYSTEMS:  Unobtainable as he is encephalopathic  DRUG ALLERGIES:   Allergies  Allergen Reactions  . Fish Allergy Anaphylaxis  . Keflex [Cephalexin] Nausea And Vomiting    Daughter advises that his vomiting is extremely "voilent and severe".  . Morphine And Related Nausea And Vomiting  . Penicillins     Has patient had a PCN reaction causing immediate rash, facial/tongue/throat swelling,} If all of the above answers are "NO", then may proceed with Cephalosporin use. SOB or lightheadedness with hypotension: No Has patient had a PCN reaction causing severe rash involving mucus membranes or skin necrosis: Unknown Has patient had a PCN reaction that required hospitalization Unknown Has patient had a PCN reaction occurring within the last 10 years: No     VITALS:  Blood pressure (!) 171/76, pulse 69, temperature 97.6 F (36.4 C), temperature source Oral, resp. rate 19, height 5\' 9"  (1.753 m), weight 100.4 kg (221 lb 4.8 oz), SpO2 94 %.  PHYSICAL EXAMINATION:  GENERAL:  83 y.o.-year-old patient lying in the bed with no acute distress.  EYES: Pupils equal, round, reactive to light and accommodation. No scleral icterus. HEENT: Head atraumatic, normocephalic. Oropharynx and nasopharynx clear.  NECK:  Supple, no jugular venous distention. No thyroid enlargement, no tenderness.  LUNGS: Moderate breath sounds bilaterally, no wheezing, rales,rhonchi or crepitation. No use of accessory muscles of respiration.  CARDIOVASCULAR: S1, S2 normal. No murmurs, rubs, or gallops.  ABDOMEN: Soft, nontender, nondistended. Bowel sounds present. No organomegaly or mass.  EXTREMITIES: No pedal edema, cyanosis, or clubbing.  NEUROLOGIC: Encephalopathic PSYCHIATRIC: The patient is alert and  disoriented  sKIN: No obvious rash, lesion, or ulcer.    LABORATORY PANEL:   CBC Recent Labs  Lab 07/13/17 0420  WBC 8.8  HGB 11.4*  HCT 35.4*  PLT 217   ------------------------------------------------------------------------------------------------------------------  Chemistries  Recent Labs  Lab 07/12/17 1729 07/13/17 0420 07/15/17 0547  NA 135 135  --   K 4.1 5.1  --   CL 98* 98*  --   CO2 29 29  --   GLUCOSE 221* 379*  --   BUN 35* 33*  --   CREATININE 1.38* 1.19 0.89  CALCIUM 8.4* 8.6*  --   AST 13*  --   --   ALT 8*  --   --   ALKPHOS 49  --   --   BILITOT 0.4  --   --    ------------------------------------------------------------------------------------------------------------------  Cardiac Enzymes Recent Labs  Lab 07/12/17 1729  TROPONINI <0.03   ------------------------------------------------------------------------------------------------------------------  RADIOLOGY:  No results found.  EKG:   Orders placed or performed during the hospital encounter of 07/12/17  . EKG 12-Lead  . EKG 12-Lead    ASSESSMENT AND PLAN:   Sepsis due to UTI.  ESBL E. coli Continue antibiotics IV Invanz , patient can be discharged with IM Invanz for 1 more week tomorrow according to infectious disease  blood culture coag negative Staphylococcus  IV fluid support. MRSA PCR negative Pressure ulcer on the sacral region-stage III, reposition patient, wound care follow-up- Nystatin powder to candidal infection in the groin  Acute metabolic encephalopathy due to above.    But has baseline dementia  aspiration the fall precaution. Speech therapy evaluation  Rash.  Unclear etiology, Benadryl as  needed.  CKD stage III.  Stable.  Diabetes.  Start sliding scale, continue Lantus  Hypertension.  Continue home hypertension meds, IV hydralazine as needed for elevated blood pressure  Patient has home hospice  Follow-up with hospice care nurse   All the  records are reviewed and case discussed with Care Management/Social Workerr.  Plan of care discussed with the patient's daughter Owens Sharkam Prichard CODE STATUS: DO NOT RESUSCITATE  TOTAL TIME TAKING CARE OF THIS PATIENT: 35 minutes.   POSSIBLE D/C IN 2DAYS, DEPENDING ON CLINICAL CONDITION.  Note: This dictation was prepared with Dragon dictation along with smaller phrase technology. Any transcriptional errors that result from this process are unintentional.   Ramonita LabGouru, Lovenia Debruler M.D on 07/15/2017 at 4:16 PM  Between 7am to 6pm - Pager - [phone] After 6pm go to [email or web address] - password [email or web address] [facility]  [facility] [place] [facility]  Office  [phone]  CC: Primary care physician; Leanna SatoMiles, Linda M, MD

## 2017-07-15 NOTE — Progress Notes (Signed)
[facility] Date of Admission:  07/12/2017     ID: [place] Cellar[place]scar T [place]ays is a 83 y.o. male with UTI Active Problems:   Sepsis (HCC)   Stage III pressure ulcer of sacral region [facility](HCC)   Subjective: No fevers, UCX e coli  ROS  Eleven systems are reviewed and negative except per hpi  Medications:  Antibiotics Given (last 72 hours)    Date/Time Action Medication Dose Rate   07/12/17 1843 New Bag/Given   meropenem (MERREM) 2 g in sodium chloride 0.9 % 100 mL IVPB 2 g 200 mL/hr   07/12/17 1850 New Bag/Given   vancomycin (VANCOCIN) IVPB 1000 mg/200 mL premix 1,000 mg 200 mL/hr   07/12/17 2303 New Bag/Given   levofloxacin (LEVAQUIN) IVPB 750 mg 750 mg 100 mL/hr   07/12/17 2303 New Bag/Given   aztreonam (AZACTAM) 1 g in dextrose 5 % 50 mL IVPB 1 g 100 mL/hr   07/13/17 [id number] New Bag/Given   aztreonam (AZACTAM) 1 g in dextrose 5 % 50 mL IVPB 1 g 100 mL/hr   07/13/17 1318 New Bag/Given   aztreonam (AZACTAM) 1 g in dextrose 5 % 50 mL IVPB 1 g 100 mL/hr   07/14/17 1818 New Bag/Given   cefTRIAXone (ROCEPHIN) 1 g in dextrose 5 % 50 mL IVPB 1 g 100 mL/hr   07/15/17 1310 New Bag/Given   ertapenem (INVANZ) 1 g in sodium chloride 0.9 % 50 mL IVPB 1 g 100 mL/hr     . budesonide  0.5 mg Nebulization BID  . docusate sodium  100 mg Oral QHS  . feeding supplement (ENSURE ENLIVE)  237 mL Oral BID WC  . ferrous sulfate  325 mg Oral QHS  . furosemide  40 mg Oral Daily  . gabapentin  200-300 mg Oral TID  . heparin  5,000 Units Subcutaneous Q8H  . insulin aspart  4 Units Subcutaneous TID WC  . insulin glargine  18 Units Subcutaneous Daily  . latanoprost  1 drop Right Eye QHS  . mouth rinse  15 mL Mouth Rinse BID  . metoprolol tartrate  12.5 mg Oral BID  . nystatin   Topical QID  . pantoprazole  40 mg Oral QAC breakfast    Objective: Vital signs in last 24 hours: Temp:  [97.6 F (36.4 C)-97.8 F (36.6 C)] 97.6 F (36.4 C) (11/07 0429) Pulse Rate:  [69] 69 (11/07  0429) Resp:  [19] 19 (11/07 0429) BP: (126-171)/(64-76) 171/76 (11/07 0429) SpO2:  [94 %-97 %] 94 % (11/07 0743) Constitutional: He is awake, very HOH, able to converse and follow simple commands HENT: anicteric Mouth/Throat: Oropharynx is clear and moist. No oropharyngeal exudate.  Cardiovascular: Normal rate, regular rhythm and normal heart sounds. Pulmonary/Chest: Effort normal and breath sounds normal. No respiratory distress. He has no wheezes.  Abdominal: Soft. Obese. GU - scrotal erythema and candidal infection  Lymphadenopathy: He has no cervical adenopathy.  Neurological: He is alert and oriented to person, place, and time.  Skin: He has a fine macular eruption on legs and chest. Psychiatric: He has a normal mood and affect. His behavior is normal.   Lab Results Recent Labs    07/12/17 1729 07/13/17 0420 07/15/17 0547  WBC 11.2* 8.8  --   HGB 11.0* 11.4*  --   HCT 35.0* 35.4*  --   NA 135 135  --   K 4.1 5.1  --   CL 98* 98*  --   CO2 29 29  --  BUN 35* 33*  --   CREATININE 1.38* 1.19 0.89    Microbiology: Results for orders placed or performed during the hospital encounter of 07/12/17  Urine culture     Status: Abnormal   Collection Time: 07/12/17  5:29 PM  Result Value Ref Range Status   Specimen Description URINE, CATHETERIZED  Final   Special Requests NONE  Final   Culture (A)  Final    >=100,000 COLONIES/mL ESCHERICHIA COLI Confirmed Extended Spectrum Beta-Lactamase Producer (ESBL).  In bloodstream infections from ESBL organisms, carbapenems are preferred over piperacillin/tazobactam. They are shown to have a lower risk of mortality. Performed at [facility], [address] [place], [place] [address]    Report Status 07/15/2017 FINAL  Final   Organism ID, Bacteria ESCHERICHIA COLI (A)  Final      Susceptibility   Escherichia coli - MIC*    AMPICILLIN >=32 RESISTANT Resistant     CEFAZOLIN >=64 RESISTANT Resistant     CEFTRIAXONE RESISTANT  Resistant     CIPROFLOXACIN >=4 RESISTANT Resistant     GENTAMICIN <=1 SENSITIVE Sensitive     IMIPENEM <=0.25 SENSITIVE Sensitive     NITROFURANTOIN >=512 RESISTANT Resistant     TRIMETH/SULFA >=320 RESISTANT Resistant     AMPICILLIN/SULBACTAM >=32 RESISTANT Resistant     PIP/TAZO <=4 SENSITIVE Sensitive     Extended ESBL POSITIVE Resistant     * >=100,000 COLONIES/mL ESCHERICHIA COLI  Blood Culture (routine x 2)     Status: None (Preliminary result)   Collection Time: 07/12/17  5:29 PM  Result Value Ref Range Status   Specimen Description BLOOD BLOOD RIGHT FOREARM  Final   Special Requests   Final    BOTTLES DRAWN AEROBIC AND ANAEROBIC Blood Culture results may not be optimal due to an excessive volume of blood received in culture bottles   Culture NO GROWTH 3 DAYS  Final   Report Status PENDING  Incomplete  Blood Culture (routine x 2)     Status: Abnormal   Collection Time: 07/12/17  5:29 PM  Result Value Ref Range Status   Specimen Description BLOOD BLOOD LEFT FOREARM  Final   Special Requests   Final    BOTTLES DRAWN AEROBIC AND ANAEROBIC Blood Culture adequate volume   Culture  Setup Time   Final    GRAM POSITIVE COCCI IN BOTH AEROBIC AND ANAEROBIC BOTTLES CRITICAL RESULT CALLED TO, READ BACK BY AND VERIFIED WITH: KAREN HAYES ON 07/13/17 AT 1410 JAG GRAM STAIN REVIEWED-AGREE WITH RESULT    Culture (A)  Final    STAPHYLOCOCCUS SPECIES (COAGULASE NEGATIVE) THE SIGNIFICANCE OF ISOLATING THIS ORGANISM FROM A SINGLE SET OF BLOOD CULTURES WHEN MULTIPLE SETS ARE DRAWN IS UNCERTAIN. PLEASE NOTIFY THE MICROBIOLOGY DEPARTMENT WITHIN ONE WEEK IF SPECIATION AND SENSITIVITIES ARE REQUIRED. Performed at [facility], [address], [place], [place] [address]    Report Status 07/15/2017 FINAL  Final  Blood Culture ID Panel (Reflexed)     Status: Abnormal   Collection Time: 07/12/17  5:29 PM  Result Value Ref Range Status   Enterococcus species NOT DETECTED NOT DETECTED Final    Listeria monocytogenes NOT DETECTED NOT DETECTED Final   Staphylococcus species DETECTED (A) NOT DETECTED Final    Comment: Methicillin (oxacillin) susceptible coagulase negative staphylococcus. Possible blood culture contaminant (unless isolated from more than one blood culture draw or clinical case suggests pathogenicity). No antibiotic treatment is indicated for blood  culture contaminants. CRITICAL RESULT CALLED TO, READ BACK BY  AND VERIFIED WITH: KAREN HAYES ON 07/13/17 AT 1410 JAG    Staphylococcus aureus NOT DETECTED NOT DETECTED Final   Methicillin resistance NOT DETECTED NOT DETECTED Final   Streptococcus species NOT DETECTED NOT DETECTED Final   Streptococcus agalactiae NOT DETECTED NOT DETECTED Final   Streptococcus pneumoniae NOT DETECTED NOT DETECTED Final   Streptococcus pyogenes NOT DETECTED NOT DETECTED Final   Acinetobacter baumannii NOT DETECTED NOT DETECTED Final   Enterobacteriaceae species NOT DETECTED NOT DETECTED Final   Enterobacter cloacae complex NOT DETECTED NOT DETECTED Final   Escherichia coli NOT DETECTED NOT DETECTED Final   Klebsiella oxytoca NOT DETECTED NOT DETECTED Final   Klebsiella pneumoniae NOT DETECTED NOT DETECTED Final   Proteus species NOT DETECTED NOT DETECTED Final   Serratia marcescens NOT DETECTED NOT DETECTED Final   Haemophilus influenzae NOT DETECTED NOT DETECTED Final   Neisseria meningitidis NOT DETECTED NOT DETECTED Final   Pseudomonas aeruginosa NOT DETECTED NOT DETECTED Final   Candida albicans NOT DETECTED NOT DETECTED Final   Candida glabrata NOT DETECTED NOT DETECTED Final   Candida krusei NOT DETECTED NOT DETECTED Final   Candida parapsilosis NOT DETECTED NOT DETECTED Final   Candida tropicalis NOT DETECTED NOT DETECTED Final  MRSA PCR Screening     Status: None   Collection Time: 07/12/17  9:43 PM  Result Value Ref Range Status   MRSA by PCR NEGATIVE NEGATIVE Final    Comment:        The GeneXpert MRSA Assay (FDA approved  for NASAL specimens only), is one component of a comprehensive MRSA colonization surveillance program. It is not intended to diagnose MRSA infection nor to guide or monitor treatment for MRSA infections.     Studies/Results: No results found.  Assessment/Plan: [place] Cellar[place]scar T Schier is a 83 y.o. male admitted with AMS and found to have UA with WBC TNTC. BCX + Staph species but likely contaminant as in only one set. UCx is ESBL E coli. Clincially improving.   Recommendations Would rec ertapenem 1 gm q 24 until 11/14 - can be given intramuscular  Nystatin powder to candidal infection in groin. Thank you very much for the consult. Will follow with you.  Mick SellDavid P Fitzgerald   07/15/2017, 2:51 PM

## 2017-07-15 NOTE — Progress Notes (Signed)
Per [facility]ederKaren [facility]ays hosp[facility] liaison patient will get IM Invanz once per day for 7 days after discharge because of ESBL in his urine. Per [facility] BraunKaren hospice will pay for it and administer the injections at [facility]. Clinical Child psychotherapistocial Worker (CSW) left ChiropractorBonnie administrator at [facility] and voicemail making her aware of above. Patient's daughter Timothy Hays is aware of above. Per daughter patient will require EMS for transport. CSW will continue to follow and assist as needed.   Baker Hughes IncorporatedBailey Vonna Brabson, LCSW [phone]

## 2017-07-15 NOTE — Consult Note (Signed)
Urology Consult  I have been asked to see the patient by Dr. Sampson GoonFitzgerald, for evaluation and management of recurrent urinary tract infections.  Chief Complaint: UTIs  History of Present Illness: Timothy Hays is a 83 y.o. year old with no medical issues including dementia, CKD, diabetes, hypertension admitted to the hospital service with ESBL E. coli urinary tract infection.  He also had blood cultures growing coag negative staph but a contaminant.  No fevers or significant leukocytosis.  Review of multiple hospitalizations over the past 2 years show variable presentations came to the hospital including vomiting, elevated blood sugar, somnolence, rash and respiratory distress.  During each admission, UA was checked and he was diagnosed with a "UTI".  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  Dr. Lonna CobbStoioff).  He also has significant phimosis and incontinence.  It does not appear that any of these urines were achieved via catheterized specimen.     Renal imaging 2016 showed no anatomic issues with the kidneys or stones.  [name] a poor historian today is unable to contribute much to his history.  Has no complaints today.  He [facility].  He is not on comfort care.  Clinically he has improved.  Discharge is planned for tomorrow on IM Invanz.  Past Medical History:  Diagnosis Date  . Anginal pain (HCC)   . CAD (coronary artery disease)   . CKD (chronic kidney disease), stage III (HCC)   . Diabetes mellitus without complication (HCC)   . Dyspnea   . GERD (gastroesophageal reflux disease)   . History of gout   . Hyperlipidemia   . Hypertension   . Myocardial infarction (HCC)   . Peripheral vascular disease (HCC)     History reviewed. No pertinent surgical history.  Home Medications:  Current Meds  Medication Sig  . acetaminophen (TYLENOL) 500 MG tablet  Take 500 mg by mouth every 4 (four) hours as needed for mild pain.   . budesonide (PULMICORT) 0.5 MG/2ML nebulizer solution Take 2 mLs (0.5 mg total) by nebulization 2 (two) times daily.  Marland Kitchen. docusate sodium (COLACE) 100 MG capsule Take 100 mg by mouth at bedtime.  . ferrous sulfate 325 (65 FE) MG tablet Take 325 mg by mouth at bedtime.  . furosemide (LASIX) 20 MG tablet Take 2 tablets (40 mg total) by mouth daily.  Marland Kitchen. gabapentin (NEURONTIN) 100 MG capsule Take 200-300 mg 3 (three) times daily by mouth. Take 2 capsules (200mg ) by mouth twice daily and 3 capsules (300mg ) at bedtime  . insulin aspart (NOVOLOG) 100 UNIT/ML injection 4 units subcutaneous injection prior to meals  . insulin glargine (LANTUS) 100 UNIT/ML injection Inject 0.18 mLs (18 Units total) into the skin daily.  Marland Kitchen. ipratropium-albuterol (DUONEB) 0.5-2.5 (3) MG/3ML SOLN Take 3 mLs by nebulization every 4 (four) hours.  Marland Kitchen. latanoprost (XALATAN) 0.005 % ophthalmic solution Place 1 drop into the right eye at bedtime.  . metoprolol tartrate (LOPRESSOR) 25 MG tablet Take 0.5 tablets (12.5 mg total) by mouth 2 (two) times daily.  . pantoprazole (PROTONIX) 40 MG tablet Take 40 mg by mouth daily.     Allergies:  Allergies  Allergen Reactions  . Fish Allergy Anaphylaxis  . Keflex [Cephalexin] Nausea And Vomiting    Daughter advises that his vomiting is extremely "voilent and severe".  . Morphine And Related Nausea And Vomiting  . Penicillins  Has patient had a PCN reaction causing immediate rash, facial/tongue/throat swelling,} If all of the above answers are "NO", then may proceed with Cephalosporin use. SOB or lightheadedness with hypotension: No Has patient had a PCN reaction causing severe rash involving mucus membranes or skin necrosis: Unknown Has patient had a PCN reaction that required hospitalization Unknown Has patient had a PCN reaction occurring within the last 10 years: No     Family History  Problem Relation Age of  Onset  . Hypertension Father   . Diabetes Neg Hx     Social History:  reports that  has never smoked. he has never used smokeless tobacco. He reports that he does not drink alcohol or use drugs.  ROS: A complete review of systems was performed.  All systems are negative except for pertinent findings as noted.  Physical Exam:  Vital signs in last 24 hours: Temp:  [97.6 F (36.4 C)-98 F (36.7 C)] 98 F (36.7 C) (11/07 1617) Pulse Rate:  [69-76] 76 (11/07 1617) Resp:  [19-20] 20 (11/07 1617) BP: (126-171)/(64-78) 143/78 (11/07 1617) SpO2:  [94 %-97 %] 96 % (11/07 1617) Constitutional:  Alert, some questions appropriately but somewhat confused.  Conversive. HEENT:  AT, moist mucus membranes.  Trachea midline, no masses Respiratory: Normal respiratory effort, no distress GI: Abdomen is soft, nontender, nondistended, no abdominal masses GU: Lateral descended testicles, no skin changes.  Buried phallus with tight phimosis, unable to retract foreskin.  Shaft of penis within the foreskin. Skin: No rashes, bruises or suspicious lesions Lymph: No inguinal adenopathy Neurologic: Grossly intact, no focal deficits, moving all 4 extremities Psychiatric: Normal mood and affect   Laboratory Data:  Recent Labs    07/13/17 0420  WBC 8.8  HGB 11.4*  HCT 35.4*   Recent Labs    07/13/17 0420 07/15/17 0547  NA 135  --   K 5.1  --   CL 98*  --   CO2 29  --   GLUCOSE 379*  --   BUN 33*  --   CREATININE 1.19 0.89  CALCIUM 8.6*  --    No results for input(s): LABPT, INR in the last 72 hours. No results for input(s): LABURIN in the last 72 hours. Results for orders placed or performed during the hospital encounter of 07/12/17  Urine culture     Status: Abnormal   Collection Time: 07/12/17  5:29 PM  Result Value Ref Range Status   Specimen Description URINE, CATHETERIZED  Final   Special Requests NONE  Final   Culture (A)  Final    >=100,000 COLONIES/mL ESCHERICHIA COLI Confirmed  Extended Spectrum Beta-Lactamase Producer (ESBL).  In bloodstream infections from ESBL organisms, carbapenems are preferred over piperacillin/tazobactam. They are shown to have a lower risk of mortality. Performed at [facility], [address], [place], [place] [address]    Report Status 07/15/2017 FINAL  Final   Organism ID, Bacteria ESCHERICHIA COLI (A)  Final      Susceptibility   Escherichia coli - MIC*    AMPICILLIN >=32 RESISTANT Resistant     CEFAZOLIN >=64 RESISTANT Resistant     CEFTRIAXONE RESISTANT Resistant     CIPROFLOXACIN >=4 RESISTANT Resistant     GENTAMICIN <=1 SENSITIVE Sensitive     IMIPENEM <=0.25 SENSITIVE Sensitive     NITROFURANTOIN >=512 RESISTANT Resistant     TRIMETH/SULFA >=320 RESISTANT Resistant     AMPICILLIN/SULBACTAM >=32 RESISTANT Resistant     PIP/TAZO <=4 SENSITIVE Sensitive  Extended ESBL POSITIVE Resistant     * >=100,000 COLONIES/mL ESCHERICHIA COLI  Blood Culture (routine x 2)     Status: None (Preliminary result)   Collection Time: 07/12/17  5:29 PM  Result Value Ref Range Status   Specimen Description BLOOD BLOOD RIGHT FOREARM  Final   Special Requests   Final    BOTTLES DRAWN AEROBIC AND ANAEROBIC Blood Culture results may not be optimal due to an excessive volume of blood received in culture bottles   Culture NO GROWTH 3 DAYS  Final   Report Status PENDING  Incomplete  Blood Culture (routine x 2)     Status: Abnormal   Collection Time: 07/12/17  5:29 PM  Result Value Ref Range Status   Specimen Description BLOOD BLOOD LEFT FOREARM  Final   Special Requests   Final    BOTTLES DRAWN AEROBIC AND ANAEROBIC Blood Culture adequate volume   Culture  Setup Time   Final    GRAM POSITIVE COCCI IN BOTH AEROBIC AND ANAEROBIC BOTTLES CRITICAL RESULT CALLED TO, READ BACK BY AND VERIFIED WITH: KAREN HAYES ON 07/13/17 AT 1410 JAG GRAM STAIN REVIEWED-AGREE WITH RESULT    Culture (A)  Final    STAPHYLOCOCCUS SPECIES (COAGULASE  NEGATIVE) THE SIGNIFICANCE OF ISOLATING THIS ORGANISM FROM A SINGLE SET OF BLOOD CULTURES WHEN MULTIPLE SETS ARE DRAWN IS UNCERTAIN. PLEASE NOTIFY THE MICROBIOLOGY DEPARTMENT WITHIN ONE WEEK IF SPECIATION AND SENSITIVITIES ARE REQUIRED. Performed at [facility], [address] [place], [place] [address]    Report Status 07/15/2017 FINAL  Final  Blood Culture ID Panel (Reflexed)     Status: Abnormal   Collection Time: 07/12/17  5:29 PM  Result Value Ref Range Status   Enterococcus species NOT DETECTED NOT DETECTED Final   Listeria monocytogenes NOT DETECTED NOT DETECTED Final   Staphylococcus species DETECTED (A) NOT DETECTED Final    Comment: Methicillin (oxacillin) susceptible coagulase negative staphylococcus. Possible blood culture contaminant (unless isolated from more than one blood culture draw or clinical case suggests pathogenicity). No antibiotic treatment is indicated for blood  culture contaminants. CRITICAL RESULT CALLED TO, READ BACK BY AND VERIFIED WITH: KAREN HAYES ON 07/13/17 AT 1410 JAG    Staphylococcus aureus NOT DETECTED NOT DETECTED Final   Methicillin resistance NOT DETECTED NOT DETECTED Final   Streptococcus species NOT DETECTED NOT DETECTED Final   Streptococcus agalactiae NOT DETECTED NOT DETECTED Final   Streptococcus pneumoniae NOT DETECTED NOT DETECTED Final   Streptococcus pyogenes NOT DETECTED NOT DETECTED Final   Acinetobacter baumannii NOT DETECTED NOT DETECTED Final   Enterobacteriaceae species NOT DETECTED NOT DETECTED Final   Enterobacter cloacae complex NOT DETECTED NOT DETECTED Final   Escherichia coli NOT DETECTED NOT DETECTED Final   Klebsiella oxytoca NOT DETECTED NOT DETECTED Final   Klebsiella pneumoniae NOT DETECTED NOT DETECTED Final   Proteus species NOT DETECTED NOT DETECTED Final   Serratia marcescens NOT DETECTED NOT DETECTED Final   Haemophilus influenzae NOT DETECTED NOT DETECTED Final   Neisseria meningitidis NOT DETECTED NOT  DETECTED Final   Pseudomonas aeruginosa NOT DETECTED NOT DETECTED Final   Candida albicans NOT DETECTED NOT DETECTED Final   Candida glabrata NOT DETECTED NOT DETECTED Final   Candida krusei NOT DETECTED NOT DETECTED Final   Candida parapsilosis NOT DETECTED NOT DETECTED Final   Candida tropicalis NOT DETECTED NOT DETECTED Final  MRSA PCR Screening     Status: None   Collection Time: 07/12/17  9:43 PM  Result Value Ref  Range Status   MRSA by PCR NEGATIVE NEGATIVE Final    Comment:        The GeneXpert MRSA Assay (FDA approved for NASAL specimens only), is one component of a comprehensive MRSA colonization surveillance program. It is not intended to diagnose MRSA infection nor to guide or monitor treatment for MRSA infections.     Impression/ Plan: 83 year old male admitted AMS dx with ESBL UTI.  He also has severe phimosis and as far as I can discern, he is never been catheterized for urine specimen.  In the setting of severe phimosis, it is highly unlikely that he has been able to provide an adequately noncontaminated UA/urine culture.  Given that his presentations have been variable in the past, I am skeptical that these are true infections.  1.  UTIs -Highly skeptical that these are actually infections given his inability to provide noncontaminated specimen and variable presentation with nonspecific symptoms -In the future if UTI is suspected or UA is required, I would recommend a catheterized specimen (can help facilitate this as outpatient in our clinic as needed) -Check postvoid residuals overnight to ensure adequate emptying   2.  Phimosis -Recommend antifungal/steroid cream -May benefit from circumcision vs. Dorsal slit which could be performed in the office if above fails  Case discussed with Dr. Sampson Goon.  Recommendations communicated personally.  07/15/2017, 7:35 PM  Vanna Scotland,  MD

## 2017-07-15 NOTE — Progress Notes (Signed)
Visit made patient seen sitting up in bed, alert, lunch tray in front of him, writer assisted with tray set up, patient able to feed himself, he denied any pain or discomfort. Attending physician Dr. Amado CoeGouru and patient;'s daughter Elita Quickam in during visit. Infectious disease MD has been consulted as urine culture is positive for ESBL. It is a possibility that patient will need an extended course of IV antibiotics which would require a PICC line. Will continue to follow and await consult outcome. Thank you. Dayna BarkerKaren Robertson RN, BSN, [facility], [facility] L[facility] [phone] c

## 2017-07-16 LAB — GLUCOSE, CAPILLARY
GLUCOSE-CAPILLARY: 186 mg/dL — AB (ref 65–99)
Glucose-Capillary: 205 mg/dL — ABNORMAL HIGH (ref 65–99)
Glucose-Capillary: 192 mg/dL — ABNORMAL HIGH (ref 65–99)

## 2017-07-16 LAB — CBC
HEMATOCRIT: 35.4 % — AB (ref 40.0–52.0)
HEMOGLOBIN: 11.4 g/dL — AB (ref 13.0–18.0)
MCH: 28.2 pg (ref 26.0–34.0)
MCHC: 32.2 g/dL (ref 32.0–36.0)
MCV: 87.7 fL (ref 80.0–100.0)
Platelets: 250 10*3/uL (ref 150–440)
RBC: 4.04 MIL/uL — ABNORMAL LOW (ref 4.40–5.90)
RDW: 18.3 % — AB (ref 11.5–14.5)
WBC: 7.2 10*3/uL (ref 3.8–10.6)

## 2017-07-16 MED ORDER — ERTAPENEM SODIUM 1 G IJ SOLR: 1.0000 g | INTRAMUSCULAR | 0 refills | Status: DC

## 2017-07-16 MED ORDER — ERTAPENEM SODIUM 1 G IJ SOLR: 1.0000 g | INTRAMUSCULAR | 0 refills | Status: AC

## 2017-07-16 NOTE — Discharge Instructions (Signed)
[facility] ° °DIET:  °Regular diet ° °DISCHARGE CONDITION:  °Stable ° °ACTIVITY:  °Activity as tolerated ° °OXYGEN:  °Home Oxygen: No. °  °Oxygen Delivery: room air ° °DISCHARGE LOCATION:  °home  ° ° °ADDITIONAL DISCHARGE INSTRUCTION: ° ° °If you experience worsening of your admission symptoms, develop shortness of breath, life threatening emergency, suicidal or homicidal thoughts you must seek medical attention immediately by calling 911 or calling your MD immediately  if symptoms less severe. ° °You Must read complete instructions/literature along with all the possible adverse reactions/side effects for all the Medicines you take and that have been prescribed to you. Take any new Medicines after you have completely understood and accpet all the possible adverse reactions/side effects.  ° °Please note ° °You were cared for by a hospitalist during your hospital stay. If you have any questions about your discharge medications or the care you received while you were in the hospital after you are discharged, you can call the unit and asked to speak with the hospitalist on call if the hospitalist that took care of you is not available. Once you are discharged, your primary care physician will handle any further medical issues. Please note that NO REFILLS for any discharge medications will be authorized once you are discharged, as it is imperative that you return to your primary care physician (or establish a relationship with a primary care physician if you do not have one) for your aftercare needs so that they can reassess your need for medications and monitor your lab values. ° ° °

## 2017-07-16 NOTE — Discharge Summary (Signed)
[facility] [place] at [facility], [place] y.o., DOB Apr 29, 1919, MRN [id number]. Admission date: 07/12/2017 Discharge Date 07/16/2017 Primary MD Leanna Sato, MD Admitting Physician Shaune Pollack, MD  Admission Diagnosis  Somnolence [R40.0] Rash [R21] Sepsis, due to unspecified organism [facility]) [A41.9] Urinary tract infection without hematuria, site unspecified [N39.0]  Discharge Diagnosis   Active Problems: Acute metabolic encephalopathy due to UTI Sepsis due to UTI Rash unclear cause now resolved Stage III pressure ulcer of sacral region [facility]) Diabetes type 2 Essential hypertension Coronary artery disease Chronic kidney disease stage III Peripheral vascular disease GERD      Hospital Course  Timothy Hays  is a 83 y.o. male with a known history of recurrent UTI, CAD, CKD stage III, hypertension, diabetes, hyperlipidemia, PVD and GERD.  The patient is unresponsive to verbal stimuli and is somnolent. Per his daughter, the patient has had generalized weakness for the past few days.  He was found somnolent, unresponsive and body rash in assisted living facility.  The patient was noted to have UTI and admitted for IV antibiotics.  Urine culture did show that he has ESBL producing E. coli.  He was seen in consultation by infectious disease who recommended 6 more days of IV Invanz starting November 9 patient is back to baseline..              Consults  None  Significant Tests:  See full reports for all details     Dg Chest Port 1 View  Result Date: 07/12/2017 CLINICAL DATA:  Pt presents to ED via [facility] from [facility] c/o AMS and rash. EMS report they were initially called out for unresponsiveness. Upon their arrival sternal rub was performed and pt regained consciousnessHx/o CAD, CKD, HTN, and MI EXAM: PORTABLE CHEST 1 VIEW COMPARISON:  03/16/2017 FINDINGS: Cardiac silhouette is mildly enlarged. No convincing mediastinal or hilar masses.  There is central vascular congestion with bilateral interstitial thickening and intervening hazy airspace opacity. This is greater in the lower lungs. There may be small effusions. No pneumothorax. Skeletal structures are demineralized but grossly intact. IMPRESSION: 1. Findings consistent with congestive heart failure. No convincing pneumonia. Electronically Signed   By: Amie Portland M.D.   On: 07/12/2017 18:36       Today   Subjective:   Timothy Hays patient has no symptoms Objective:   Blood pressure 136/60, pulse 72, temperature 97.6 F (36.4 C), temperature source Oral, resp. rate 16, height 5\' 9"  (1.753 m), weight 221 lb 4.8 oz (100.4 kg), SpO2 96 %.  .  Intake/Output Summary (Last 24 hours) at 07/16/2017 1326 Last data filed at 07/16/2017 0317 Gross per 24 hour  Intake 50 ml  Output -  Net 50 ml    Exam VITAL SIGNS: Blood pressure 136/60, pulse 72, temperature 97.6 F (36.4 C), temperature source Oral, resp. rate 16, height 5\' 9"  (1.753 m), weight 221 lb 4.8 oz (100.4 kg), SpO2 96 %.  GENERAL:  83 y.o.-year-old patient lying in the bed with no acute distress.  EYES: Pupils equal, round, reactive to light and accommodation. No scleral icterus. Extraocular muscles intact.  HEENT: Head atraumatic, normocephalic. Oropharynx and nasopharynx clear.  NECK:  Supple, no jugular venous distention. No thyroid enlargement, no tenderness.  LUNGS: Normal breath sounds bilaterally, no wheezing, rales,rhonchi or crepitation. No use of accessory muscles of respiration.  CARDIOVASCULAR: S1, S2 normal. No murmurs, rubs, or gallops.  ABDOMEN: Soft, nontender, nondistended. Bowel sounds present. No organomegaly  or mass.  EXTREMITIES: No pedal edema, cyanosis, or clubbing.  NEUROLOGIC: Cranial nerves II through XII are intact. Muscle strength 5/5 in all extremities. Sensation intact. Gait not checked.  PSYCHIATRIC: The patient is alert and oriented x 3.  SKIN: No obvious rash, lesion, or ulcer.    Data Review     CBC w Diff:  Lab Results  Component Value Date   WBC 7.2 07/16/2017   HGB 11.4 (L) 07/16/2017   HGB 11.7 (L) 10/10/2013   HCT 35.4 (L) 07/16/2017   HCT 35.7 (L) 10/10/2013   PLT 250 07/16/2017   PLT 198 10/10/2013   LYMPHOPCT 7 07/12/2017   LYMPHOPCT 11.5 10/05/2013   MONOPCT 8 07/12/2017   MONOPCT 10.3 10/05/2013   EOSPCT 7 07/12/2017   EOSPCT 5.7 10/05/2013   BASOPCT 0 07/12/2017   BASOPCT 0.6 10/05/2013   CMP:  Lab Results  Component Value Date   NA 135 07/13/2017   NA 134 (L) 10/10/2013   K 5.1 07/13/2017   K 4.1 10/10/2013   CL 98 (L) 07/13/2017   CL 100 10/10/2013   CO2 29 07/13/2017   CO2 26 10/10/2013   BUN 33 (H) 07/13/2017   BUN 26 (H) 10/10/2013   CREATININE 0.89 07/15/2017   CREATININE 1.46 (H) 10/10/2013   PROT 7.1 07/12/2017   PROT 7.2 10/02/2013   ALBUMIN 2.8 (L) 07/12/2017   ALBUMIN 3.3 (L) 10/02/2013   BILITOT 0.4 07/12/2017   BILITOT 0.2 10/02/2013   ALKPHOS 49 07/12/2017   ALKPHOS 61 10/02/2013   AST 13 (L) 07/12/2017   AST 17 10/02/2013   ALT 8 (L) 07/12/2017   ALT 15 10/02/2013  .  Micro Results Recent Results (from the past 240 hour(s))  Urine culture     Status: Abnormal   Collection Time: 07/12/17  5:29 PM  Result Value Ref Range Status   Specimen Description URINE, CATHETERIZED  Final   Special Requests NONE  Final   Culture (A)  Final    >=100,000 COLONIES/mL ESCHERICHIA COLI Confirmed Extended Spectrum Beta-Lactamase Producer (ESBL).  In bloodstream infections from ESBL organisms, carbapenems are preferred over piperacillin/tazobactam. They are shown to have a lower risk of mortality. Performed at [facility], [address] [place], [place] [address]    Report Status 07/15/2017 FINAL  Final   Organism ID, Bacteria ESCHERICHIA COLI (A)  Final      Susceptibility   Escherichia coli - MIC*    AMPICILLIN >=32 RESISTANT Resistant     CEFAZOLIN >=64 RESISTANT Resistant     CEFTRIAXONE RESISTANT  Resistant     CIPROFLOXACIN >=4 RESISTANT Resistant     GENTAMICIN <=1 SENSITIVE Sensitive     IMIPENEM <=0.25 SENSITIVE Sensitive     NITROFURANTOIN >=512 RESISTANT Resistant     TRIMETH/SULFA >=320 RESISTANT Resistant     AMPICILLIN/SULBACTAM >=32 RESISTANT Resistant     PIP/TAZO <=4 SENSITIVE Sensitive     Extended ESBL POSITIVE Resistant     * >=100,000 COLONIES/mL ESCHERICHIA COLI  Blood Culture (routine x 2)     Status: None (Preliminary result)   Collection Time: 07/12/17  5:29 PM  Result Value Ref Range Status   Specimen Description BLOOD BLOOD RIGHT FOREARM  Final   Special Requests   Final    BOTTLES DRAWN AEROBIC AND ANAEROBIC Blood Culture results may not be optimal due to an excessive volume of blood received in culture bottles   Culture NO GROWTH 4 DAYS  Final   Report Status  PENDING  Incomplete  Blood Culture (routine x 2)     Status: Abnormal   Collection Time: 07/12/17  5:29 PM  Result Value Ref Range Status   Specimen Description BLOOD BLOOD LEFT FOREARM  Final   Special Requests   Final    BOTTLES DRAWN AEROBIC AND ANAEROBIC Blood Culture adequate volume   Culture  Setup Time   Final    GRAM POSITIVE COCCI IN BOTH AEROBIC AND ANAEROBIC BOTTLES CRITICAL RESULT CALLED TO, READ BACK BY AND VERIFIED WITH: KAREN HAYES ON 07/13/17 AT 1410 JAG GRAM STAIN REVIEWED-AGREE WITH RESULT    Culture (A)  Final    STAPHYLOCOCCUS SPECIES (COAGULASE NEGATIVE) THE SIGNIFICANCE OF ISOLATING THIS ORGANISM FROM A SINGLE SET OF BLOOD CULTURES WHEN MULTIPLE SETS ARE DRAWN IS UNCERTAIN. PLEASE NOTIFY THE MICROBIOLOGY DEPARTMENT WITHIN ONE WEEK IF SPECIATION AND SENSITIVITIES ARE REQUIRED. Performed at [facility], [address] [place], [place] [address]    Report Status 07/15/2017 FINAL  Final  Blood Culture ID Panel (Reflexed)     Status: Abnormal   Collection Time: 07/12/17  5:29 PM  Result Value Ref Range Status   Enterococcus species NOT DETECTED NOT DETECTED Final    Listeria monocytogenes NOT DETECTED NOT DETECTED Final   Staphylococcus species DETECTED (A) NOT DETECTED Final    Comment: Methicillin (oxacillin) susceptible coagulase negative staphylococcus. Possible blood culture contaminant (unless isolated from more than one blood culture draw or clinical case suggests pathogenicity). No antibiotic treatment is indicated for blood  culture contaminants. CRITICAL RESULT CALLED TO, READ BACK BY AND VERIFIED WITH: KAREN HAYES ON 07/13/17 AT 1410 JAG    Staphylococcus aureus NOT DETECTED NOT DETECTED Final   Methicillin resistance NOT DETECTED NOT DETECTED Final   Streptococcus species NOT DETECTED NOT DETECTED Final   Streptococcus agalactiae NOT DETECTED NOT DETECTED Final   Streptococcus pneumoniae NOT DETECTED NOT DETECTED Final   Streptococcus pyogenes NOT DETECTED NOT DETECTED Final   Acinetobacter baumannii NOT DETECTED NOT DETECTED Final   Enterobacteriaceae species NOT DETECTED NOT DETECTED Final   Enterobacter cloacae complex NOT DETECTED NOT DETECTED Final   Escherichia coli NOT DETECTED NOT DETECTED Final   Klebsiella oxytoca NOT DETECTED NOT DETECTED Final   Klebsiella pneumoniae NOT DETECTED NOT DETECTED Final   Proteus species NOT DETECTED NOT DETECTED Final   Serratia marcescens NOT DETECTED NOT DETECTED Final   Haemophilus influenzae NOT DETECTED NOT DETECTED Final   Neisseria meningitidis NOT DETECTED NOT DETECTED Final   Pseudomonas aeruginosa NOT DETECTED NOT DETECTED Final   Candida albicans NOT DETECTED NOT DETECTED Final   Candida glabrata NOT DETECTED NOT DETECTED Final   Candida krusei NOT DETECTED NOT DETECTED Final   Candida parapsilosis NOT DETECTED NOT DETECTED Final   Candida tropicalis NOT DETECTED NOT DETECTED Final  MRSA PCR Screening     Status: None   Collection Time: 07/12/17  9:43 PM  Result Value Ref Range Status   MRSA by PCR NEGATIVE NEGATIVE Final    Comment:        The GeneXpert MRSA Assay (FDA approved  for NASAL specimens only), is one component of a comprehensive MRSA colonization surveillance program. It is not intended to diagnose MRSA infection nor to guide or monitor treatment for MRSA infections.         Code Status Orders  (From admission, onward)        Start     Ordered   07/12/17 2046  Do not attempt resuscitation (DNR)  Continuous    Question Answer Comment  In the event of cardiac or respiratory ARREST Do not call a "code blue"   In the event of cardiac or respiratory ARREST Do not perform Intubation, CPR, defibrillation or ACLS   In the event of cardiac or respiratory ARREST Use medication by any route, position, wound care, and other measures to relive pain and suffering. May use oxygen, suction and manual treatment of airway obstruction as needed for comfort.      07/12/17 2045    Code Status History    Date Active Date Inactive Code Status Order ID Comments User Context   07/12/2017 18:00 07/12/2017 20:45 DNR [id number]  Loleta Rose, MD ED   03/16/2017 04:07 03/17/2017 17:44 DNR [id number]  Alfonse Flavors, RN Inpatient   03/16/2017 03:08 03/16/2017 04:07 Full Code [id number]  Tonye Royalty, DO Inpatient   03/16/2017 02:00 03/16/2017 03:08 DNR [id number]  Tonye Royalty, DO ED   12/18/2016 12:49 12/23/2016 19:52 DNR [id number]  Altamese Dilling, MD Inpatient   12/18/2016 07:20 12/18/2016 12:49 DNR [id number]  Loleta Rose, MD ED   09/02/2016 15:16 09/03/2016 22:28 DNR [id number]  Wyatt Haste, MD ED   09/02/2016 15:00 09/02/2016 15:15 Full Code [id number]  Hower, Cletis Athens, MD ED    Advance Directive Documentation     Most Recent Value  Type of Advance Directive  Out of facility DNR (pink MOST or yellow form)  Pre-existing out of facility DNR order (yellow form or pink MOST form)  Yellow form placed in chart (order not valid for inpatient use)  "MOST" Form in Place?  No data          Follow-up Information    Leanna Sato, MD .   Specialty:  Family  Medicine Contact information: [address] [place] [address] [phone]           Discharge Medications   Allergies as of 07/16/2017      Reactions   Fish Allergy Anaphylaxis   Keflex [cephalexin] Nausea And Vomiting   Daughter advises that his vomiting is extremely "voilent and severe".   Morphine And Related Nausea And Vomiting   Penicillins    Has patient had a PCN reaction causing immediate rash, facial/tongue/throat swelling,} If all of the above answers are "NO", then may proceed with Cephalosporin use. SOB or lightheadedness with hypotension: No Has patient had a PCN reaction causing severe rash involving mucus membranes or skin necrosis: Unknown Has patient had a PCN reaction that required hospitalization Unknown Has patient had a PCN reaction occurring within the last 10 years: No      Medication List    STOP taking these medications   ciprofloxacin 500 MG tablet Commonly known as:  CIPRO     TAKE these medications   acetaminophen 500 MG tablet Commonly known as:  TYLENOL Take 500 mg by mouth every 4 (four) hours as needed for mild pain.   albuterol 108 (90 Base) MCG/ACT inhaler Commonly known as:  PROVENTIL HFA;VENTOLIN HFA Inhale 2 puffs into the lungs every 4 (four) hours as needed for wheezing or shortness of breath.   budesonide 0.5 MG/2ML nebulizer solution Commonly known as:  PULMICORT Take 2 mLs (0.5 mg total) by nebulization 2 (two) times daily.   docusate sodium 100 MG capsule Commonly known as:  COLACE Take 100 mg by mouth at bedtime.   ertapenem 1 g injection Commonly known as:  INVANZ Inject 1 g daily for 6 days into the muscle. Dissolve  in 1%lidocaine Start taking on:  07/17/2017   ferrous sulfate 325 (65 FE) MG tablet Take 325 mg by mouth at bedtime.   furosemide 20 MG tablet Commonly known as:  LASIX Take 2 tablets (40 mg total) by mouth daily.   gabapentin 100 MG capsule Commonly known as:  NEURONTIN Take 200-300 mg  3 (three) times daily by mouth. Take 2 capsules (200mg ) by mouth twice daily and 3 capsules (300mg ) at bedtime   guaiFENesin-dextromethorphan 100-10 MG/5ML syrup Commonly known as:  ROBITUSSIN DM Take 10 mLs by mouth every 4 (four) hours as needed for cough.   insulin aspart 100 UNIT/ML injection Commonly known as:  novoLOG 4 units subcutaneous injection prior to meals   insulin glargine 100 UNIT/ML injection Commonly known as:  LANTUS Inject 0.18 mLs (18 Units total) into the skin daily.   ipratropium-albuterol 0.5-2.5 (3) MG/3ML Soln Commonly known as:  DUONEB Take 3 mLs by nebulization every 4 (four) hours.   latanoprost 0.005 % ophthalmic solution Commonly known as:  XALATAN Place 1 drop into the right eye at bedtime.   liver oil-zinc oxide 40 % ointment Commonly known as:  DESITIN Apply bis to buttocks   metoprolol tartrate 25 MG tablet Commonly known as:  LOPRESSOR Take 0.5 tablets (12.5 mg total) by mouth 2 (two) times daily.   nitroGLYCERIN 0.4 MG SL tablet Commonly known as:  NITROSTAT Place 0.4 mg under the tongue every 5 (five) minutes as needed for chest pain.   nystatin powder Commonly known as:  MYCOSTATIN/NYSTOP Apply tid to groin bilaterally   nystatin-triamcinolone ointment Commonly known as:  MYCOLOG Apply 1 application 2 (two) times daily topically.   ondansetron 4 MG tablet Commonly known as:  ZOFRAN Take 4 mg by mouth every 6 (six) hours as needed for nausea or vomiting.   pantoprazole 40 MG tablet Commonly known as:  PROTONIX Take 40 mg by mouth daily.   polyethylene glycol packet Commonly known as:  MIRALAX / GLYCOLAX Take 17 g by mouth daily as needed for mild constipation.          Total Time in preparing paper work, data evaluation and todays exam - 35 minutes  Auburn BilberryPATEL, Claus Silvestro M.D on 07/16/2017 at 1:26 PM  [facility]   Office  [phone]

## 2017-07-16 NOTE — Plan of Care (Signed)
[name] stable and ready for discharge today. Good appetite, taking medications without problem. Pt received a bath this morning. Will get patient ready for transport once discharge paperwork is complete.

## 2017-07-16 NOTE — [place] FL2 (Signed)
[place] MEDICA[place] FL2 LEVEL OF CARE SCREENING TOOL     IDENTIFICATION  Patient Name:  Timothy Hays Birthdate: 10/12/1918 Sex: male Admission Date (Current Location): 07/12/2017  [place]   Facility and Address:  [facility], [address], [place], [place] [address]      Provider Number: [id number]  Attending Physician Name and Address:  Auburn BilberryPatel, Shreyang, MD  Relative Name and Phone Number:       Current Level of Care: Hospital Recommended Level of Care: Assisted Living Facility Prior Approval Number:    Date Approved/Denied:   PASRR Number:   [id number]  Discharge Plan: (ALF)    Current Diagnoses: Patient Active Problem List   Diagnosis Date Noted  . Urinary tract infection without hematuria   . 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 Hyperglycemia 03/16/2017  . Pressure injury of skin 03/16/2017  . Acute CHF (HCC) 12/18/2016  . Acute CHF (congestive heart failure) (HCC) 12/18/2016  . Sepsis (HCC) 09/02/2016    Orientation RESPIRATION BLADDER Height & Weight    X3 Self, Pace and Time     Normal Incontinent Weight: 221 lb 4.8 oz (100.4 kg) Height:  5\' 9"  (175.3 cm)  BEHAVIORAL SYMPTOMS/MOOD NEUROLOGICAL BOWEL NUTRITION STATUS      Continent  Regular Diet   AMBULATORY STATUS COMMUNICATION OF NEEDS Skin    Extensive Assistance Verbally Normal                       Personal Care Assistance Level of Assistance   Extensive Assistance for Bathing and Dressing Limited Assistance for Feeding.            Functional Limitations Info             SPECIAL CARE FACTORS FREQUENCY  Followed by [facility]                    Contractures Contractures Info: Not present    Additional Factors Info  Code Status, Allergies Code Status Info: dnr Allergies Info: fish allergy; keflex          Discharge Medications: Please see discharge summary for a list of  discharge medications. STOP taking these medications   ciprofloxacin 500 MG tablet Commonly known as:  CIPRO     TAKE these medications   acetaminophen 500 MG tablet Commonly known as:  TYLENOL Take 500 mg by mouth every 4 (four) hours as needed for mild pain.   albuterol 108 (90 Base) MCG/ACT inhaler Commonly known as:  PROVENTIL HFA;VENTOLIN HFA Inhale 2 puffs into the lungs every 4 (four) hours as needed for wheezing or shortness of breath.   budesonide 0.5 MG/2ML nebulizer solution Commonly known as:  PULMICORT Take 2 mLs (0.5 mg total) by nebulization 2 (two) times daily.   docusate sodium 100 MG capsule Commonly known as:  COLACE Take 100 mg by mouth at bedtime.   ertapenem 1 g injection Commonly known as:  INVANZ Inject 1 g daily for 6 days into the muscle. Dissolve in 1%lidocaine Start taking on:  07/17/2017   ferrous sulfate 325 (65 FE) MG tablet Take 325 mg by mouth at bedtime.   furosemide 20 MG tablet Commonly known as:  LASIX Take 2 tablets (40 mg total) by mouth daily.   gabapentin 100 MG capsule Commonly known as:  NEURONTIN Take 200-300 mg 3 (three) times daily by mouth. Take 2 capsules (200mg ) by mouth  twice daily and 3 capsules (300mg ) at bedtime   guaiFENesin-dextromethorphan 100-10 MG/5ML syrup Commonly known as:  ROBITUSSIN DM Take 10 mLs by mouth every 4 (four) hours as needed for cough.   insulin aspart 100 UNIT/ML injection Commonly known as:  novoLOG 4 units subcutaneous injection prior to meals   insulin glargine 100 UNIT/ML injection Commonly known as:  LANTUS Inject 0.18 mLs (18 Units total) into the skin daily.   ipratropium-albuterol 0.5-2.5 (3) MG/3ML Soln Commonly known as:  DUONEB Take 3 mLs by nebulization every 4 (four) hours.   latanoprost 0.005 % ophthalmic solution Commonly known as:  XALATAN Place 1 drop into the right eye at bedtime.   liver oil-zinc oxide 40 % ointment Commonly known as:  DESITIN Apply  bis to buttocks   metoprolol tartrate 25 MG tablet Commonly known as:  LOPRESSOR Take 0.5 tablets (12.5 mg total) by mouth 2 (two) times daily.   nitroGLYCERIN 0.4 MG SL tablet Commonly known as:  NITROSTAT Place 0.4 mg under the tongue every 5 (five) minutes as needed for chest pain.   nystatin powder Commonly known as:  MYCOSTATIN/NYSTOP Apply tid to groin bilaterally   nystatin-triamcinolone ointment Commonly known as:  MYCOLOG Apply 1 application 2 (two) times daily topically.   ondansetron 4 MG tablet Commonly known as:  ZOFRAN Take 4 mg by mouth every 6 (six) hours as needed for nausea or vomiting.   pantoprazole 40 MG tablet Commonly known as:  PROTONIX Take 40 mg by mouth daily.   polyethylene glycol packet Commonly known as:  MIRALAX / GLYCOLAX Take 17 g by mouth daily as needed for mild constipation.   Relevant Imaging Results:  Relevant Lab Results:   Additional Information  SSN: [id number]   Dewanna Hurston, Darleen CrockerBailey M, [place]

## 2017-07-16 NOTE — Progress Notes (Signed)
Pt refused evening medications due to discharge soon, we're waiting on EMS. Pt denies wanting to eat dinner.

## 2017-07-16 NOTE — Progress Notes (Signed)
[facility] Aware pt received daily dose of antibiotic today. No skin issues- foam on sacrum for protection. Good appetite. LBM today. Pt daughter aware of discharge. Pt is ready and stable for discharge. EMS called for transportation.

## 2017-07-16 NOTE — Progress Notes (Signed)
Visit made. Patient seen sleeping, ate well at breakfast and lunch. Patient received his IV dose of Ivanz this afternoon, [facility]. Pres[facility] and [facility] to [facility]. Hospital care team, patient's daughterand Hospice team all aware of and in agreement with discharge plan. Patient will discharge via EMS. Signed DNR in place in discharge packet,  Dayna BarkerKaren robertson RN, BSN, [facility] [place] [facility], [facility] [place] [phone] c

## 2017-07-16 NOTE — Progress Notes (Signed)
[name] medically stable for D/C back to [facility] and continue hospice with [facility]. Per Clydie BraunKaren hospice liaison they [facility]. [facility] is aware of above and stated that patient can return today. Clinical Child psychotherapistocial Worker (CSW) sent D/C summary and FL2 to [facility]. RN will call report and arrange EMS for transport. [name] aware of above. CSW contacted patient's daughter Timothy Hays and made her aware of above. Please reconsult if future social work needs arise. CSW signing off.   [employer] Jaylan Hinojosa, LCSW [phone]

## 2017-07-17 LAB — CULTURE, BLOOD (ROUTINE X 2): Culture: NO GROWTH

## 2017-09-17 IMAGING — DX DG CHEST 1V PORT
1 series · 1 of 1 positions shown · non-contrast
Comparison: Prior radiograph from 12/22/2016.

CLINICAL DATA: Initial evaluation for acute shortness of breath.

EXAM:
PORTABLE CHEST 1 VIEW

[chest ap]
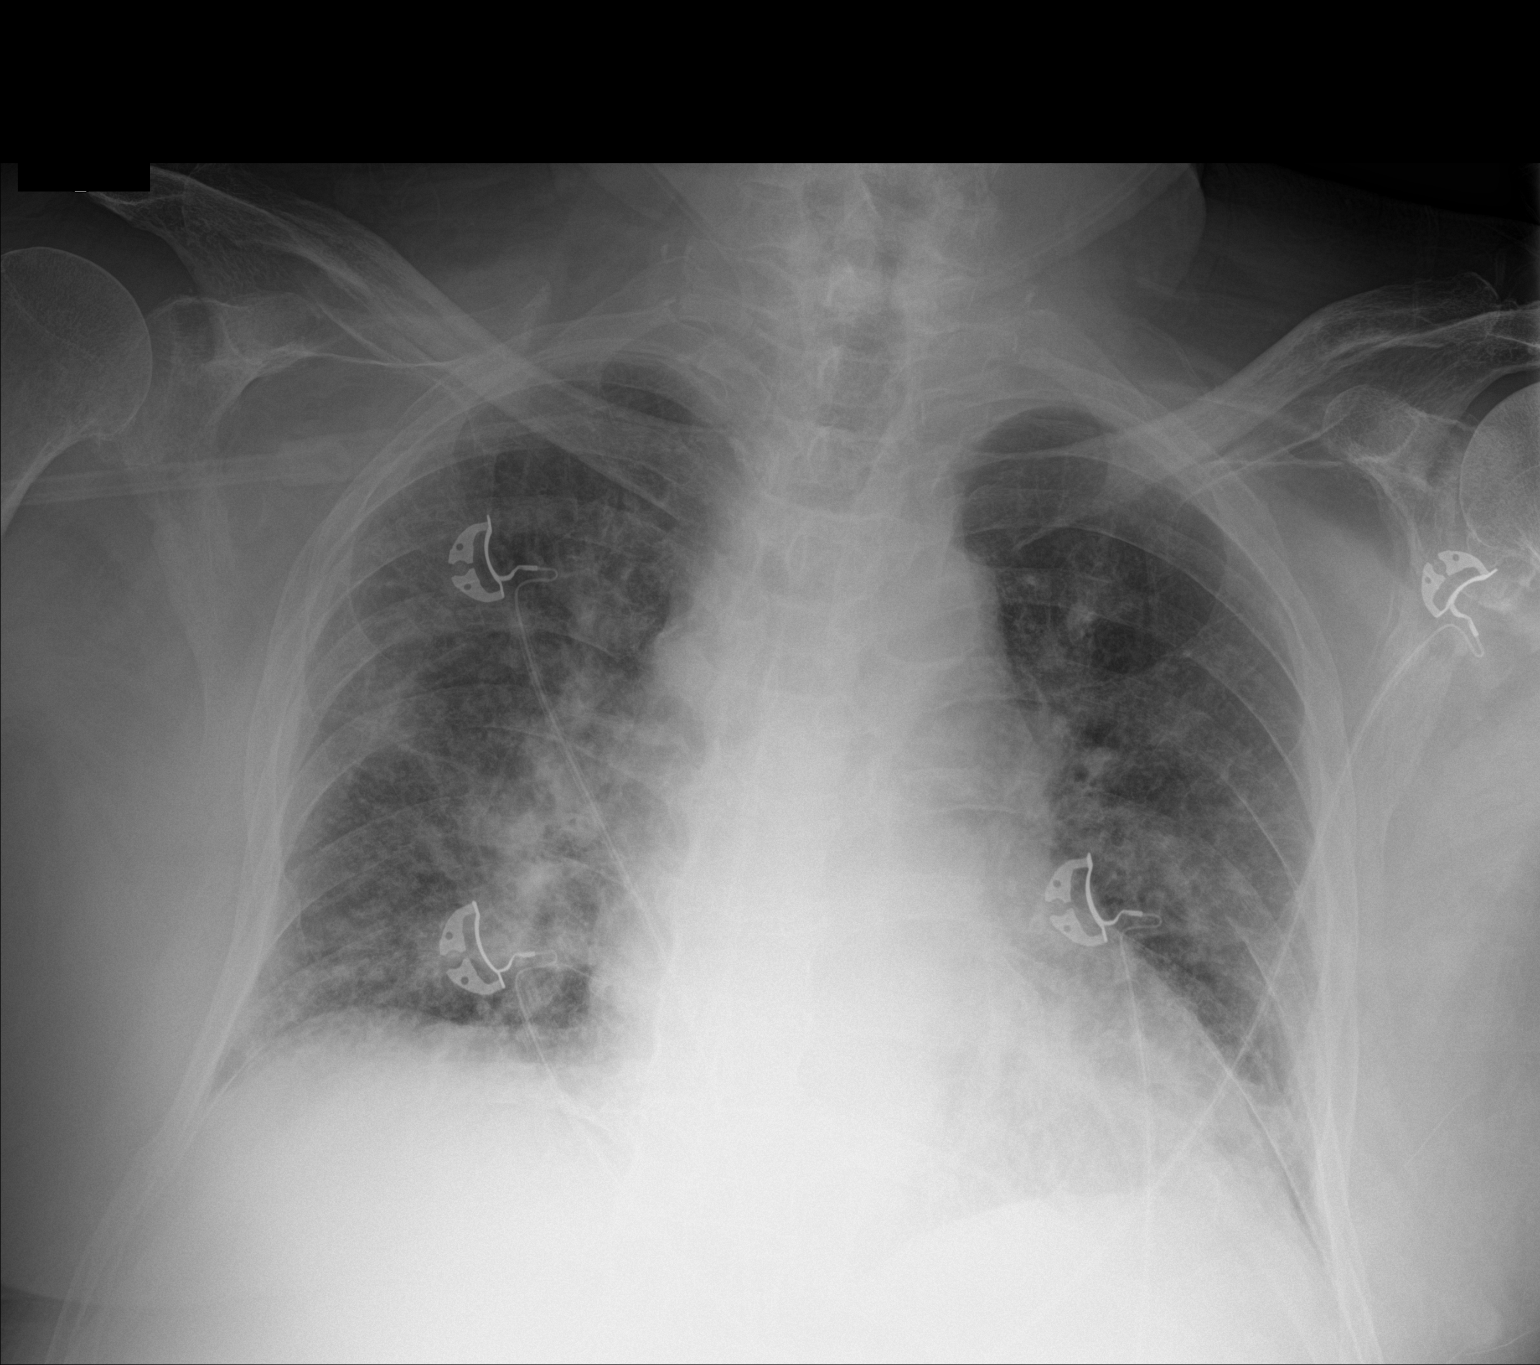

[1 of 1 positions shown; findings below may reference images not displayed]

FINDINGS: Moderate cardiomegaly, stable. Mediastinal silhouette within normal
limits. Aortic atherosclerosis.

Lungs hypoinflated. Diffuse pulmonary vascular congestion with
interstitial prominence, consistent with pulmonary edema. No
definite focal infiltrates. No obvious pleural effusion. No
pneumothorax.

No acute osseus abnormality.
IMPRESSION: 1. Stable cardiomegaly with mild to moderate pulmonary interstitial
edema.
2. Aortic atherosclerosis.

## 2017-09-17 IMAGING — CT CT HEAD W/O CM
3 series · 14 of 47 positions shown, 16 images · non-contrast
Comparison: Prior CT from 03/14/2013.

CLINICAL DATA: Initial evaluation for acute fall, forehead trauma.

EXAM:
CT HEAD WITHOUT CONTRAST
TECHNIQUE: Contiguous axial images were obtained from the base of the skull
through the vertex without intravenous contrast.

[Series 2: head wo · axial · 0.47mm/px · z∈[-185,-60]mm · 8 of 31 slices shown, 10 images]
[im 3/31  brain]
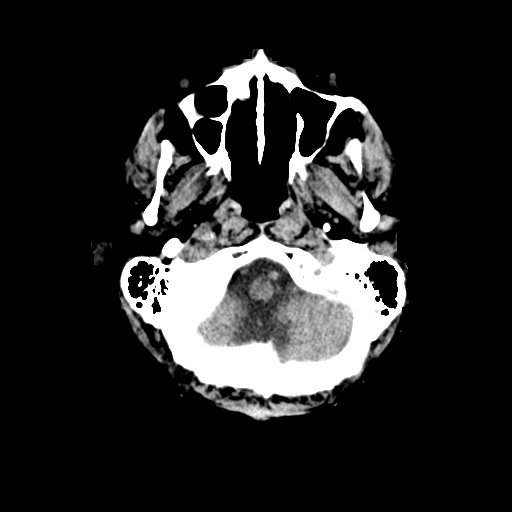
[im 3/31  bone]
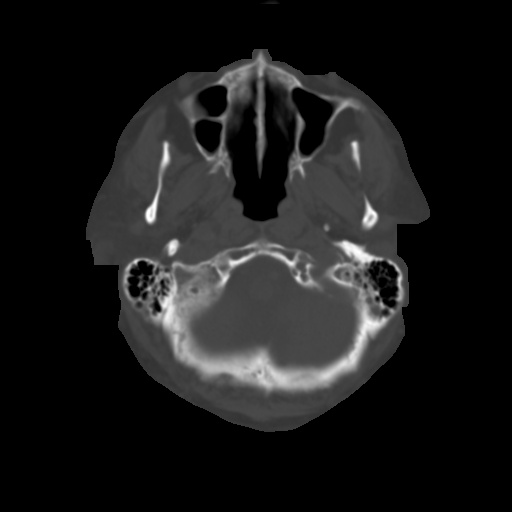
[im 7/31  brain]
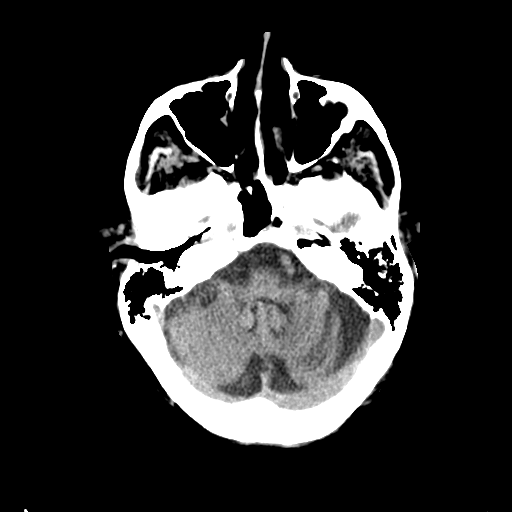
[im 10/31  brain]
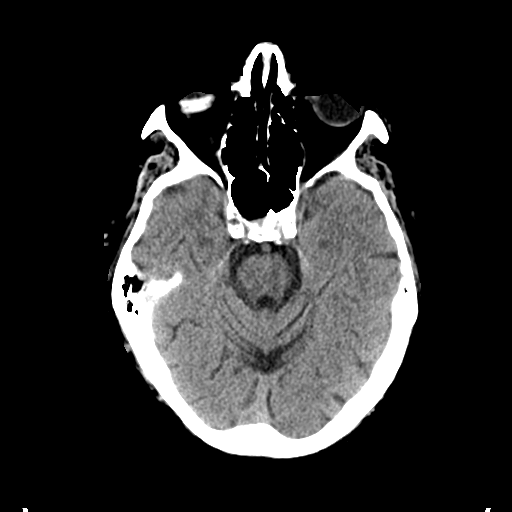
[im 14/31  brain]
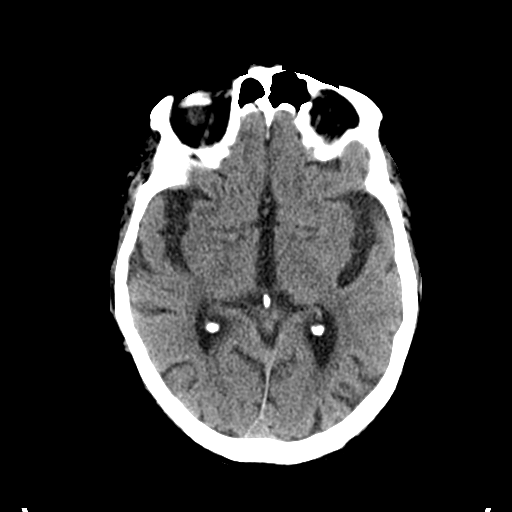
[im 17/31  brain]
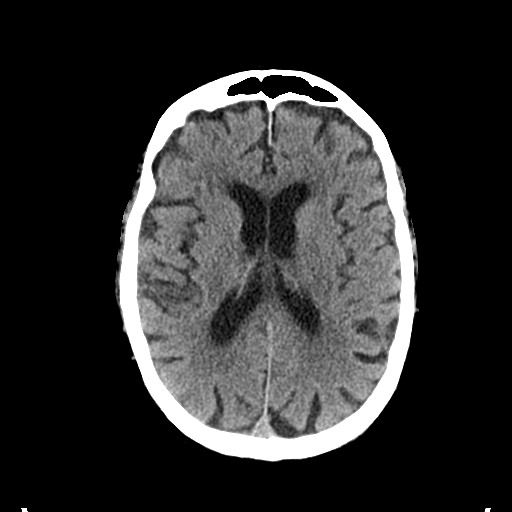
[im 17/31  bone]
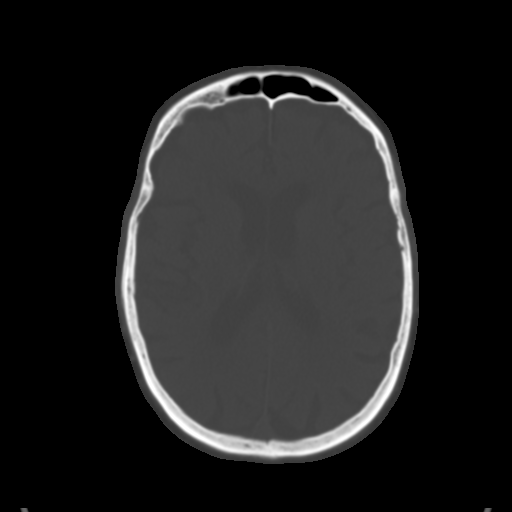
[im 21/31  brain]
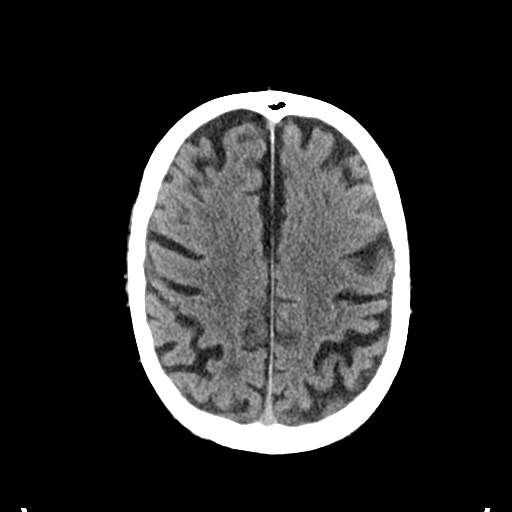
[im 24/31  brain]
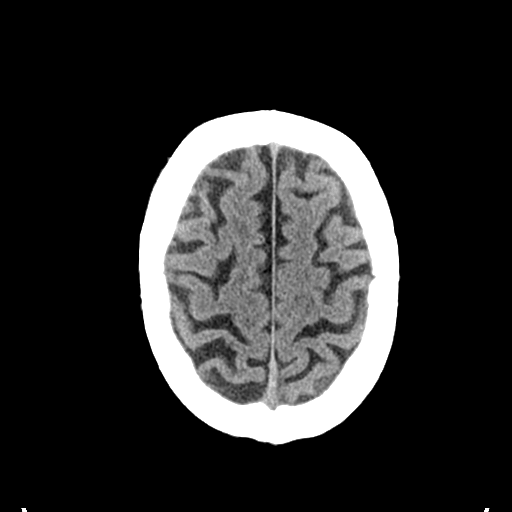
[im 28/31  brain]
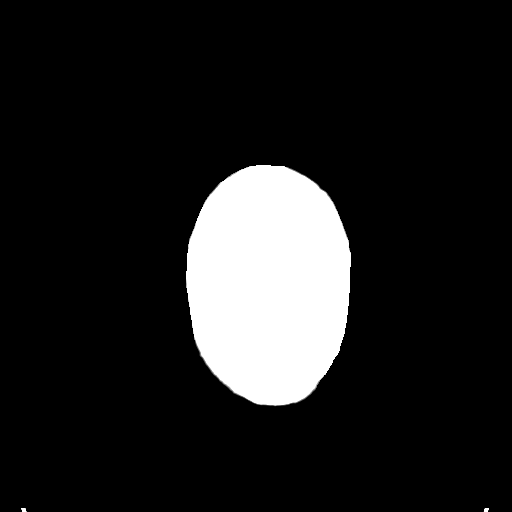

[Series 4: coronal soft tissue · coronal · 0.29mm/px · 3 of 67 slices shown]
[im 25/67  brain]
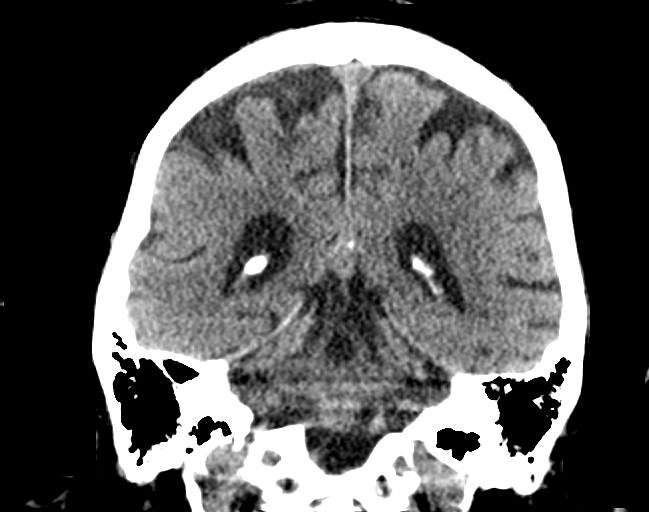
[im 31/67  brain]
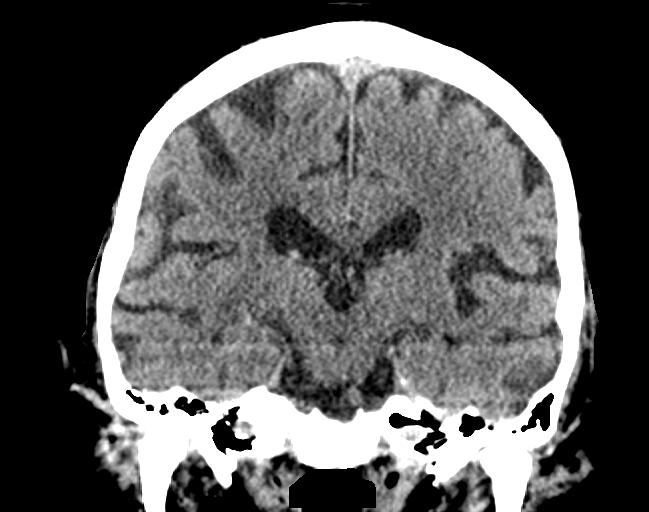
[im 36/67  brain]
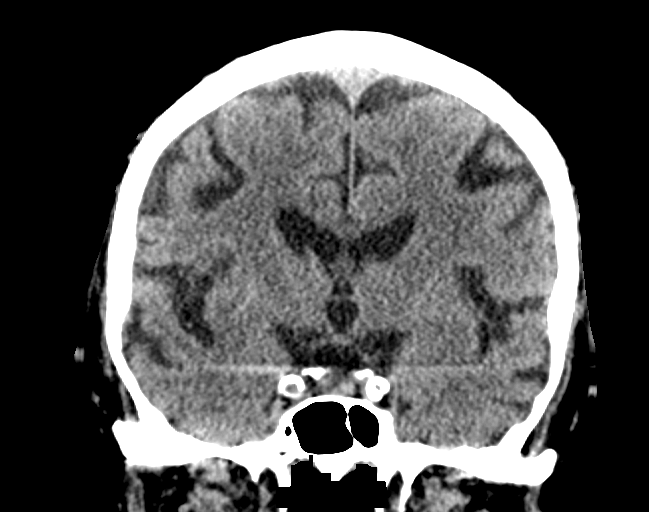

[Series 5: sagittal soft tissue · sagittal · 0.29mm/px · 3 of 55 slices shown]
[im 19/55  brain]
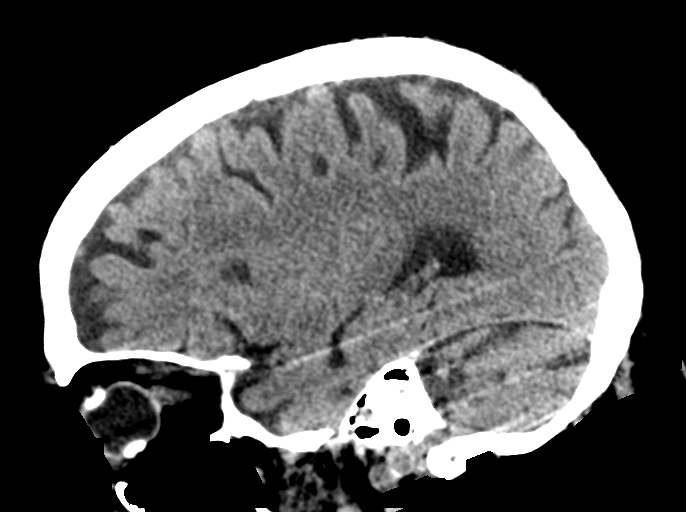
[im 28/55  brain]
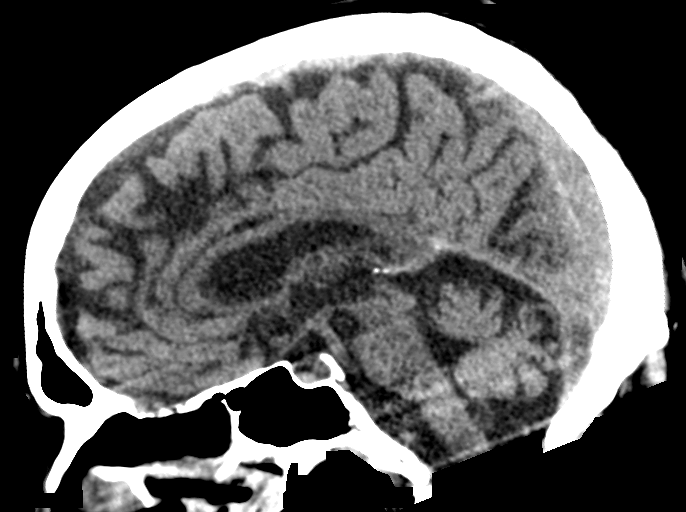
[im 37/55  brain]
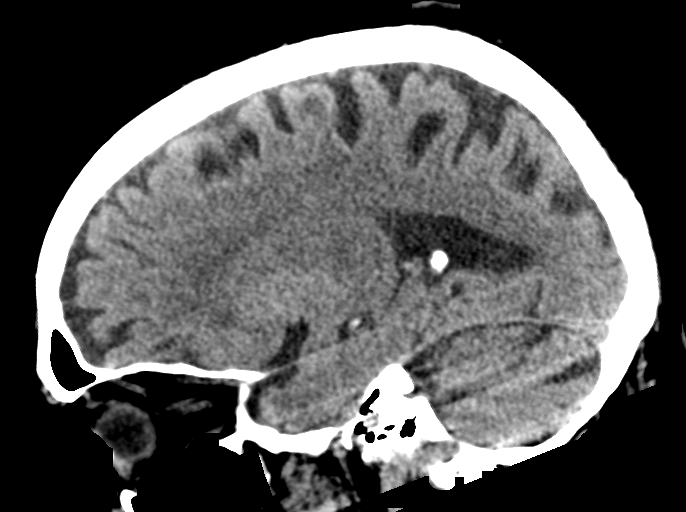

[14 of 47 positions shown; findings below may reference images not displayed]

FINDINGS: Brain: Mild age-related cerebral atrophy with chronic small vessel
ischemic disease. Few scattered remote cerebellar infarcts noted.
Additional probable tiny remote lacunar infarct within the left
thalamus. No acute intracranial hemorrhage. No evidence for acute
large vessel territory infarct. No mass lesion, midline shift or
mass effect. No hydrocephalus. No extra-axial fluid collection.

Vascular: No hyperdense vessel. Scattered vascular calcifications
noted within the carotid siphons.

Skull: Scalp soft tissues demonstrate no acute abnormality.
Calvarium intact.

Sinuses/Orbits: Globes and orbital soft tissues demonstrate no acute
abnormality. Postsurgical changes noted at the right globe. Patient
status post lens extraction bilaterally. Scattered mucosal
thickening noted throughout the paranasal sinuses. Small chronic
left mastoid effusion. Right mastoid air cells clear.
IMPRESSION: 1. No acute intracranial process identified.
2. Mild age-related cerebral atrophy with chronic small vessel
ischemic disease, with few small scattered remote infarcts as above.

## 2017-09-19 ENCOUNTER — Emergency Department

## 2017-09-19 ENCOUNTER — Emergency Department
Admission: EM | Admit: 2017-09-19 | Discharge: 2017-09-19 | Disposition: A | Discharge status: Home / Self Care | Attending: Emergency Medicine | Admitting: Emergency Medicine

## 2017-09-19 ENCOUNTER — Other Ambulatory Visit: Payer: Self-pay

## 2017-09-19 DIAGNOSIS — E1122 Type 2 diabetes mellitus with diabetic chronic kidney disease: Secondary | ICD-10-CM | POA: Diagnosis not present

## 2017-09-19 DIAGNOSIS — L98419 Non-pressure chronic ulcer of buttock with unspecified severity: Secondary | ICD-10-CM | POA: Diagnosis not present

## 2017-09-19 DIAGNOSIS — E11622 Type 2 diabetes mellitus with other skin ulcer: Secondary | ICD-10-CM | POA: Diagnosis not present

## 2017-09-19 DIAGNOSIS — Z7401 Bed confinement status: Secondary | ICD-10-CM | POA: Diagnosis not present

## 2017-09-19 DIAGNOSIS — Z794 Long term (current) use of insulin: Secondary | ICD-10-CM | POA: Insufficient documentation

## 2017-09-19 DIAGNOSIS — R109 Unspecified abdominal pain: Secondary | ICD-10-CM | POA: Diagnosis present

## 2017-09-19 DIAGNOSIS — I129 Hypertensive chronic kidney disease with stage 1 through stage 4 chronic kidney disease, or unspecified chronic kidney disease: Secondary | ICD-10-CM | POA: Insufficient documentation

## 2017-09-19 DIAGNOSIS — I2692 Saddle embolus of pulmonary artery without acute cor pulmonale: Secondary | ICD-10-CM | POA: Diagnosis not present

## 2017-09-19 DIAGNOSIS — E1151 Type 2 diabetes mellitus with diabetic peripheral angiopathy without gangrene: Secondary | ICD-10-CM | POA: Diagnosis not present

## 2017-09-19 DIAGNOSIS — N183 Chronic kidney disease, stage 3 (moderate): Secondary | ICD-10-CM | POA: Diagnosis not present

## 2017-09-19 DIAGNOSIS — I252 Old myocardial infarction: Secondary | ICD-10-CM | POA: Diagnosis not present

## 2017-09-19 DIAGNOSIS — Z79899 Other long term (current) drug therapy: Secondary | ICD-10-CM | POA: Diagnosis not present

## 2017-09-19 DIAGNOSIS — I2510 Atherosclerotic heart disease of native coronary artery without angina pectoris: Secondary | ICD-10-CM | POA: Diagnosis not present

## 2017-09-19 LAB — CBC WITH DIFFERENTIAL/PLATELET
Basophils Absolute: 0.1 10*3/uL (ref 0–0.1)
Basophils Relative: 1 %
EOS PCT: 3 %
Eosinophils Absolute: 0.2 10*3/uL (ref 0–0.7)
HEMATOCRIT: 38.1 % — AB (ref 40.0–52.0)
Hemoglobin: 12.3 g/dL — ABNORMAL LOW (ref 13.0–18.0)
LYMPHS ABS: 1.4 10*3/uL (ref 1.0–3.6)
LYMPHS PCT: 15 %
MCH: 29.5 pg (ref 26.0–34.0)
MCHC: 32.5 g/dL (ref 32.0–36.0)
MCV: 90.8 fL (ref 80.0–100.0)
MONO ABS: 0.7 10*3/uL (ref 0.2–1.0)
Monocytes Relative: 7 %
NEUTROS ABS: 6.8 10*3/uL — AB (ref 1.4–6.5)
Neutrophils Relative %: 74 %
PLATELETS: 168 10*3/uL (ref 150–440)
RBC: 4.19 MIL/uL — ABNORMAL LOW (ref 4.40–5.90)
RDW: 14.8 % — AB (ref 11.5–14.5)
WBC: 9.2 10*3/uL (ref 3.8–10.6)

## 2017-09-19 LAB — COMPREHENSIVE METABOLIC PANEL
ALBUMIN: 2.5 g/dL — AB (ref 3.5–5.0)
ALK PHOS: 77 U/L (ref 38–126)
ALT: 9 U/L — AB (ref 17–63)
AST: 15 U/L (ref 15–41)
Anion gap: 7 (ref 5–15)
BUN: 13 mg/dL (ref 6–20)
CALCIUM: 8.4 mg/dL — AB (ref 8.9–10.3)
CHLORIDE: 95 mmol/L — AB (ref 101–111)
CO2: 28 mmol/L (ref 22–32)
CREATININE: 0.88 mg/dL (ref 0.61–1.24)
GFR calc Af Amer: >60 mL/min (ref >60)
GFR calc non Af Amer: >60 mL/min (ref >60)
GLUCOSE: 212 mg/dL — AB (ref 65–99)
Potassium: 4.5 mmol/L (ref 3.5–5.1)
SODIUM: 130 mmol/L — AB (ref 135–145)
Total Bilirubin: 0.4 mg/dL (ref 0.3–1.2)
Total Protein: 7.1 g/dL (ref 6.5–8.1)

## 2017-09-19 LAB — LACTIC ACID, PLASMA: Lactic Acid, Venous: 1.3 mmol/L (ref 0.5–1.9)

## 2017-09-19 LAB — LIPASE, BLOOD: Lipase: 19 U/L (ref 11–51)

## 2017-09-19 MED ORDER — IOPAMIDOL (ISOVUE-370) INJECTION 76%
75.0000 mL | Freq: Once | INTRAVENOUS | Status: AC | PRN
  Administered 2017-09-19: 75 mL via INTRAVENOUS

## 2017-09-19 NOTE — ED Triage Notes (Signed)
[facility]. Pt is a hospice pt, has PRN morphine ordered. Was given his morphine at 9:45. EMS reports shallow respirations. Hospice d/t respiratory stuff and CHF. States improvement in care but still hospice. Pt had booties on at facility d/t pressure sores on ankles, does NOT arrive with booties on. Pt wears 2 L nasal cannula at baseline.

## 2017-09-19 NOTE — ED Notes (Signed)
Condom cath applied to patient. 

## 2017-09-19 NOTE — ED Notes (Signed)
[facility]

## 2017-09-19 NOTE — ED Provider Notes (Signed)
[facility] Emergency Department Provider Note  ____________________________________________  Time seen: Approximately 2:20 PM  I have reviewed the triage vital signs and the nursing notes.   HISTORY  Chief Complaint Flank Pain  Level 5 caveat:  Portions of the history and physical were unable to be obtained due to dementia   HPI Timothy Hays is a 84 y.o. male on hospice for end-stage CHF, history of CAD, CKD, diabetes, hypertension, hyperlipidemia who [facility]. [name] very sleepy and not answering questions since he received morphine per hospice prior to transfer to us. According to [facility] nursing staff and family who is at the bedside, patient has been complaining for 2 days of pain on his left lower rib cage area. No known falls, patient is bedbound. He has had no vomiting, no diarrhea, no fevers, no cough. Today he was visited by hospice and was complaining of pain. Nursing staff was instructed to give patient his when necessary morphine which he received. The daughter then called the nursing home and requested the patient be transferred to the emergency department for further evaluation since she would like to know why patient is having pain. [name] very sleepy but arousable and answers few questions. Tells me he has no pain now but points to his L lower chest/ L upper abdominal area. Per daughter this is patient's baseline.   Past Medical History:  Diagnosis Date  . Anginal pain (HCC)   . CAD (coronary artery disease)   . CKD (chronic kidney disease), stage III (HCC)   . Diabetes mellitus without complication (HCC)   . Dyspnea   . GERD (gastroesophageal reflux disease)   . History of gout   . Hyperlipidemia   . Hypertension   . Myocardial infarction (HCC)   . Peripheral vascular disease [facility](HCC)     Patient Active Problem List   Diagnosis Date Noted  . Urinary tract infection without hematuria   . Stage  III pressure ulcer of sacral region (HCC) 07/13/2017  . Hyperglycemia 03/16/2017  . Pressure injury of skin 03/16/2017  . Acute CHF (HCC) 12/18/2016  . Acute CHF (congestive heart failure) (HCC) 12/18/2016  . Sepsis (HCC) 09/02/2016    History reviewed. No pertinent surgical history.  Prior to Admission medications   Medication Sig Start Date End Date Taking? Authorizing Provider  acetaminophen (TYLENOL) 500 MG tablet Take 500 mg by mouth every 4 (four) hours as needed for mild pain.     [provider]  albuterol (PROVENTIL HFA;VENTOLIN HFA) 108 (90 Base) MCG/ACT inhaler Inhale 2 puffs into the lungs every 4 (four) hours as needed for wheezing or shortness of breath.    [provider]  budesonide (PULMICORT) 0.5 MG/2ML nebulizer solution Take 2 mLs (0.5 mg total) by nebulization 2 (two) times daily. 12/23/16   Altamese DillingVachhani, Vaibhavkumar, MD  docusate sodium (COLACE) 100 MG capsule Take 100 mg by mouth at bedtime.    [provider]  ferrous sulfate 325 (65 FE) MG tablet Take 325 mg by mouth at bedtime.    [provider]  furosemide (LASIX) 20 MG tablet Take 2 tablets (40 mg total) by mouth daily. 03/17/17   Alford HighlandWieting, Richard, MD  gabapentin (NEURONTIN) 100 MG capsule Take 200-300 mg 3 (three) times daily by mouth. Take 2 capsules (200mg ) by mouth twice daily and 3 capsules (300mg ) at bedtime    [provider]  guaiFENesin-dextromethorphan (ROBITUSSIN DM) 100-10 MG/5ML syrup Take 10 mLs by  mouth every 4 (four) hours as needed for cough.    [provider]  insulin aspart (NOVOLOG) 100 UNIT/ML injection 4 units subcutaneous injection prior to meals 03/17/17   Wieting, Richard, MD  insulin glargine (LANTUS) 100 UNIT/ML injection Inject 0.18 mLs (18 Units total) into the skin daily. 03/18/17   Alford Highland, MD  ipratropium-albuterol (DUONEB) 0.5-2.5 (3) MG/3ML SOLN Take 3 mLs by nebulization every 4 (four) hours. 12/23/16   Altamese Dilling,  MD  latanoprost (XALATAN) 0.005 % ophthalmic solution Place 1 drop into the right eye at bedtime.    [provider]  liver oil-zinc oxide (DESITIN) 40 % ointment Apply bis to buttocks Patient not taking: Reported on 07/12/2017 03/17/17   Alford Highland, MD  metoprolol tartrate (LOPRESSOR) 25 MG tablet Take 0.5 tablets (12.5 mg total) by mouth 2 (two) times daily. 12/23/16   Altamese Dilling, MD  nitroGLYCERIN (NITROSTAT) 0.4 MG SL tablet Place 0.4 mg under the tongue every 5 (five) minutes as needed for chest pain.    [provider]  nystatin (MYCOSTATIN/NYSTOP) powder Apply tid to groin bilaterally 03/17/17   Alford Highland, MD  nystatin-triamcinolone ointment [facility]) Apply 1 application 2 (two) times daily topically. 07/15/17   Vanna Scotland, MD  ondansetron (ZOFRAN) 4 MG tablet Take 4 mg by mouth every 6 (six) hours as needed for nausea or vomiting.    [provider]  pantoprazole (PROTONIX) 40 MG tablet Take 40 mg by mouth daily.  06/10/16   [provider]  polyethylene glycol (MIRALAX / GLYCOLAX) packet Take 17 g by mouth daily as needed for mild constipation.    [provider]    Allergies Fish allergy; Keflex [cephalexin]; Morphine and related; and Penicillins  Family History  Problem Relation Age of Onset  . Hypertension Father   . Diabetes Neg Hx     Social History Social History   Tobacco Use  . Smoking status: Never Smoker  . Smokeless tobacco: Never Used  Substance Use Topics  . Alcohol use: No  . Drug use: No    Review of Systems  Constitutional: Negative for fever. Cardiovascular: + L lower chest pain Respiratory: Negative for shortness of breath or cough Gastrointestinal: + L upper abdominal pain. No vomiting or diarrhea.  Level 5 caveat:  Portions of the history and physical were unable to be obtained due to dementia  ____________________________________________   PHYSICAL EXAM:  VITAL SIGNS: ED  Triage Vitals [09/19/17 1231]  Enc Vitals Group     BP (!) 111/58     Pulse Rate 74     Resp 16     Temp 98 F (36.7 C)     Temp src      SpO2 95 %     Weight      Height      Head Circumference      Peak Flow      Pain Score      Pain Loc      Pain Edu?      Excl. in GC?     Constitutional: asleep, arousable, will answer a few questions and falls right back asleep. No distress HEENT:      Head: Normocephalic and atraumatic.         Eyes: Conjunctivae are normal. Sclera is non-icteric.       Mouth/Throat: Mucous membranes are dry.       Neck: Supple with no signs of meningismus. Cardiovascular: Regular rate and rhythm. No murmurs, gallops, or  rubs. 2+ symmetrical distal pulses are present in all extremities. No JVD. Respiratory: Normal respiratory effort. Lungs are clear to auscultation bilaterally. No wheezes, crackles, or rhonchi.  Gastrointestinal: Soft, non tender, and non distended with positive bowel sounds. No rebound or guarding. Musculoskeletal: Nontender with normal range of motion in all extremities. No edema, cyanosis, or erythema of extremities. Neurologic: Face is symmetric. Moving all extremities.  Skin: Skin is warm, dry and intact. No rash noted. ____________________________________________   LABS (all labs ordered are listed, but only abnormal results are displayed)  Labs Reviewed  COMPREHENSIVE METABOLIC PANEL - Abnormal; Notable for the following components:      Result Value   Sodium 130 (*)    Chloride 95 (*)    Glucose, Bld 212 (*)    Calcium 8.4 (*)    Albumin 2.5 (*)    ALT 9 (*)    All other components within normal limits  CBC WITH DIFFERENTIAL/PLATELET - Abnormal; Notable for the following components:   RBC 4.19 (*)    Hemoglobin 12.3 (*)    HCT 38.1 (*)    RDW 14.8 (*)    Neutro Abs 6.8 (*)    All other components within normal limits  CULTURE, BLOOD (ROUTINE X 2)  LIPASE, BLOOD  LACTIC ACID, PLASMA  URINALYSIS, COMPLETE (UACMP)  WITH MICROSCOPIC   ____________________________________________  EKG  ED ECG REPORT I, Nita Sickle, the attending physician, personally viewed and interpreted this ECG.  Normal sinus rhythm, rate of 81, first-degree AV block, right bundle branch block, normal QTC, left axis deviation, no ST elevations or depressions.no significant changes when compared to prior ____________________________________________  RADIOLOGY  2-view abdomen/ CXR: No bowel obstruction or free air. Cardiomegaly with mild central pulmonary vascular congestion.  CT c/a/p: IMPRESSION: CT CHEST IMPRESSION  1. Large bilateral emboli, without evidence of right heart strain. 2. Coronary artery atherosclerosis. Aortic Atherosclerosis (ICD10-I70.0). 3. Mild multifactorial degradation.  CT ABDOMEN AND PELVIS IMPRESSION  1. No acute process in the abdomen or pelvis. 2. Degraded evaluation of the pelvis, secondary to beam hardening artifact from hip arthroplasties. 3. Bladder distension and irregularity, most consistent with outlet obstruction. 4. Fluid about the lower pole right kidney is slightly increased compared to 2016 but favored to be the sequelae of prior ruptured renal cyst. 5. Tiny hiatal hernia. ____________________________________________   PROCEDURES  Procedure(s) performed: None Procedures Critical Care performed:  None ____________________________________________   INITIAL IMPRESSION / ASSESSMENT AND PLAN / ED COURSE  84 y.o. male on hospice for end-stage CHF, history of CAD, CKD, diabetes, hypertension, hyperlipidemia who presents from [facility] for L flank pain x 2 days. patient has no pain at this time. He is at his baseline according to his daughter who is at the bedside. His vitals are within normal limits. Differential diagnoses including musculoskeletal pain versus PE versus pneumonia versus kidney stone versus SBO versus peptic ulcer disease versus diverticulitis versus  pyelonephritis/UTI. labs including CMP, lipase, lactic acid, and CBC with no acute findings. UA is pending. X-ray of abdomen and chest with no acute findings. Family is requesting a CT chest abdomen pelvis to determine etiology of patient's pain. CT has been ordered and is pending.  Clinical Course as of Sep 19 1741  Sat Sep 19, 2017  1730 CT showing large bilateral emboli worse on the left with no evidence of right heart strain. No other acute findings. This is most likely the cause of patient's pain. I discussed the findings with the patient's daughter and discussed  the standard of care including intra-arterial tPA, IV heparin, PO Eliquis, and ECHO cardiogram. Daughter wishes not to pursue any further interventions or start patient on anticoagulation at this time. She wishes for patient to be made comfortable and sent back under hospice care with morphine as needed for pain control. I explained to the patient's daughter that this will most likely result in patient's death since the clot burden has a tendency of getting worse as patient is bedbound and is not anticoagulated. She understands and tells me that her father has lived a long life and would not want any further interventions. I will contact Hospice so pain meds can be increased if patient needs it and I will discharge [facility] back to [facility].  [CV]  1740 Spoke with Jonnie Finner, Hospice nurse who will ensure that patient has PRN and standing order for morphine for pain. Will transfer patient back at this time.   [CV]    Clinical Course User Index [CV] Don Perking [place], MD     As part of my medical decision making, I reviewed the following data within the electronic MEDICAL RECORD NUMBER History obtained from family, Nursing notes reviewed and incorporated, Labs reviewed , EKG interpreted , Old chart reviewed, Radiograph reviewed , Notes from prior ED visits and [place] Controlled Substance Database    Pertinent labs & imaging results that were  available during my care of the patient were reviewed by me and considered in my medical decision making (see chart for details).    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    Don Perking, [place], MD 09/19/17 1743

## 2017-09-19 NOTE — ED Notes (Signed)
This RN and Paulette, RN attempted to in and out patient. Unable to complete in and out cath due to size of urinary meatus.   Pt family states that patient has hx/o melanoma of the penis and he has had surgery to have a large portion of his penis removed.   Creamy, white discharge noted from urinary meatus.

## 2017-09-19 NOTE — ED Notes (Signed)
Bedside report given to EMS.

## 2017-09-24 LAB — CULTURE, BLOOD (ROUTINE X 2): Culture: NO GROWTH

## 2018-01-06 DEATH — deceased

## 2018-03-23 IMAGING — CR DG ABDOMEN ACUTE W/ 1V CHEST
1 series · 4 of 4 positions shown · non-contrast
Comparison: Chest x-ray July 12, 2017

CLINICAL DATA: Left side abdomen pain for 2 days.

EXAM:
DG ABDOMEN ACUTE W/ 1V CHEST

[Series 1: dg abd acute w/chest · 0.14mm/px · 4 of 4 slices shown]
[im 1/4]
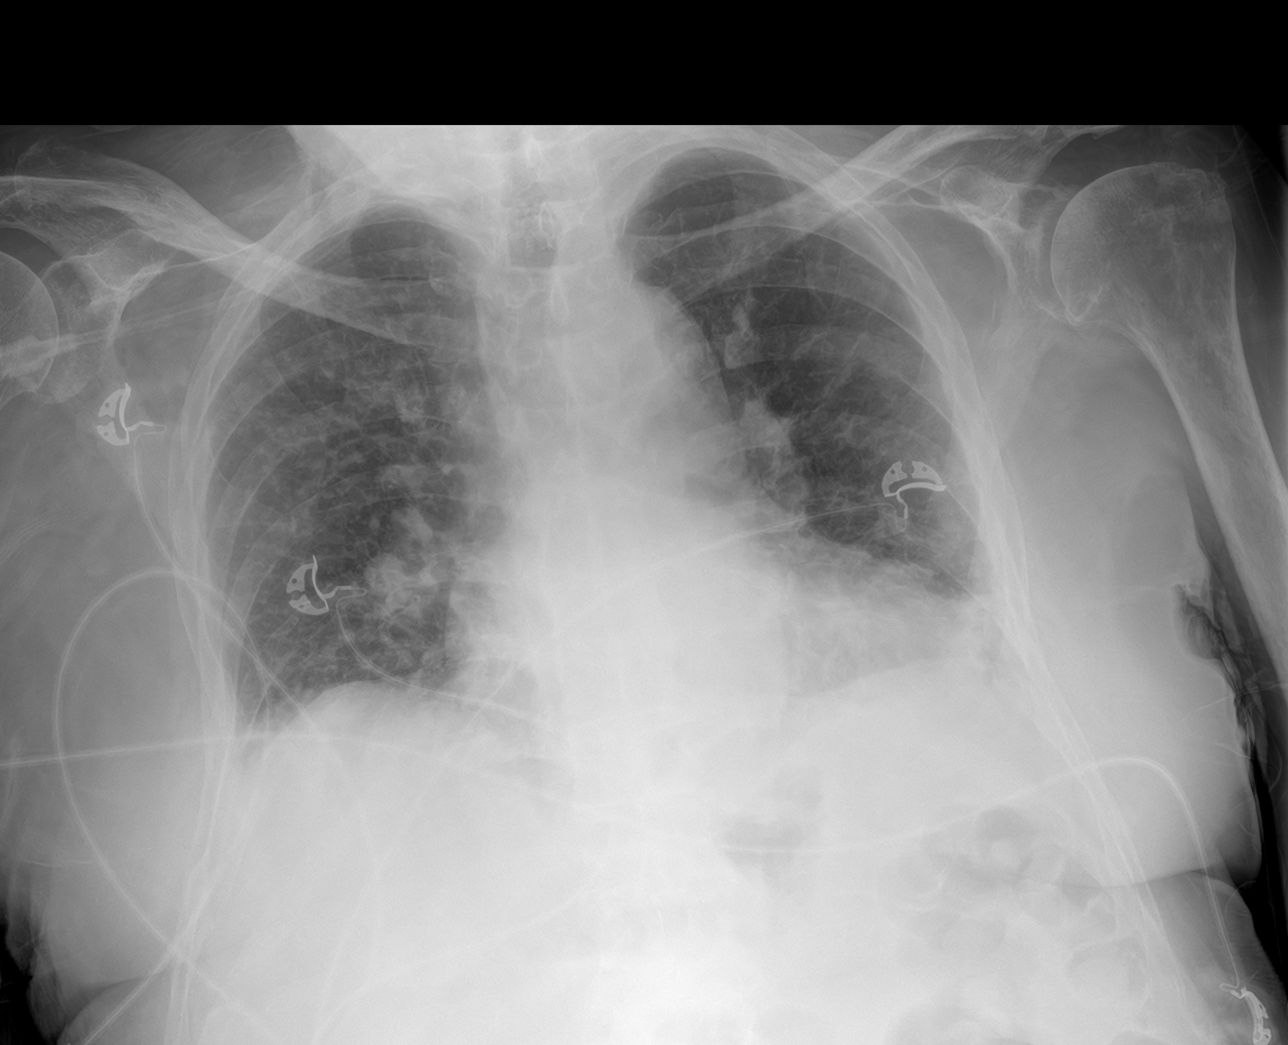
[im 2/4]
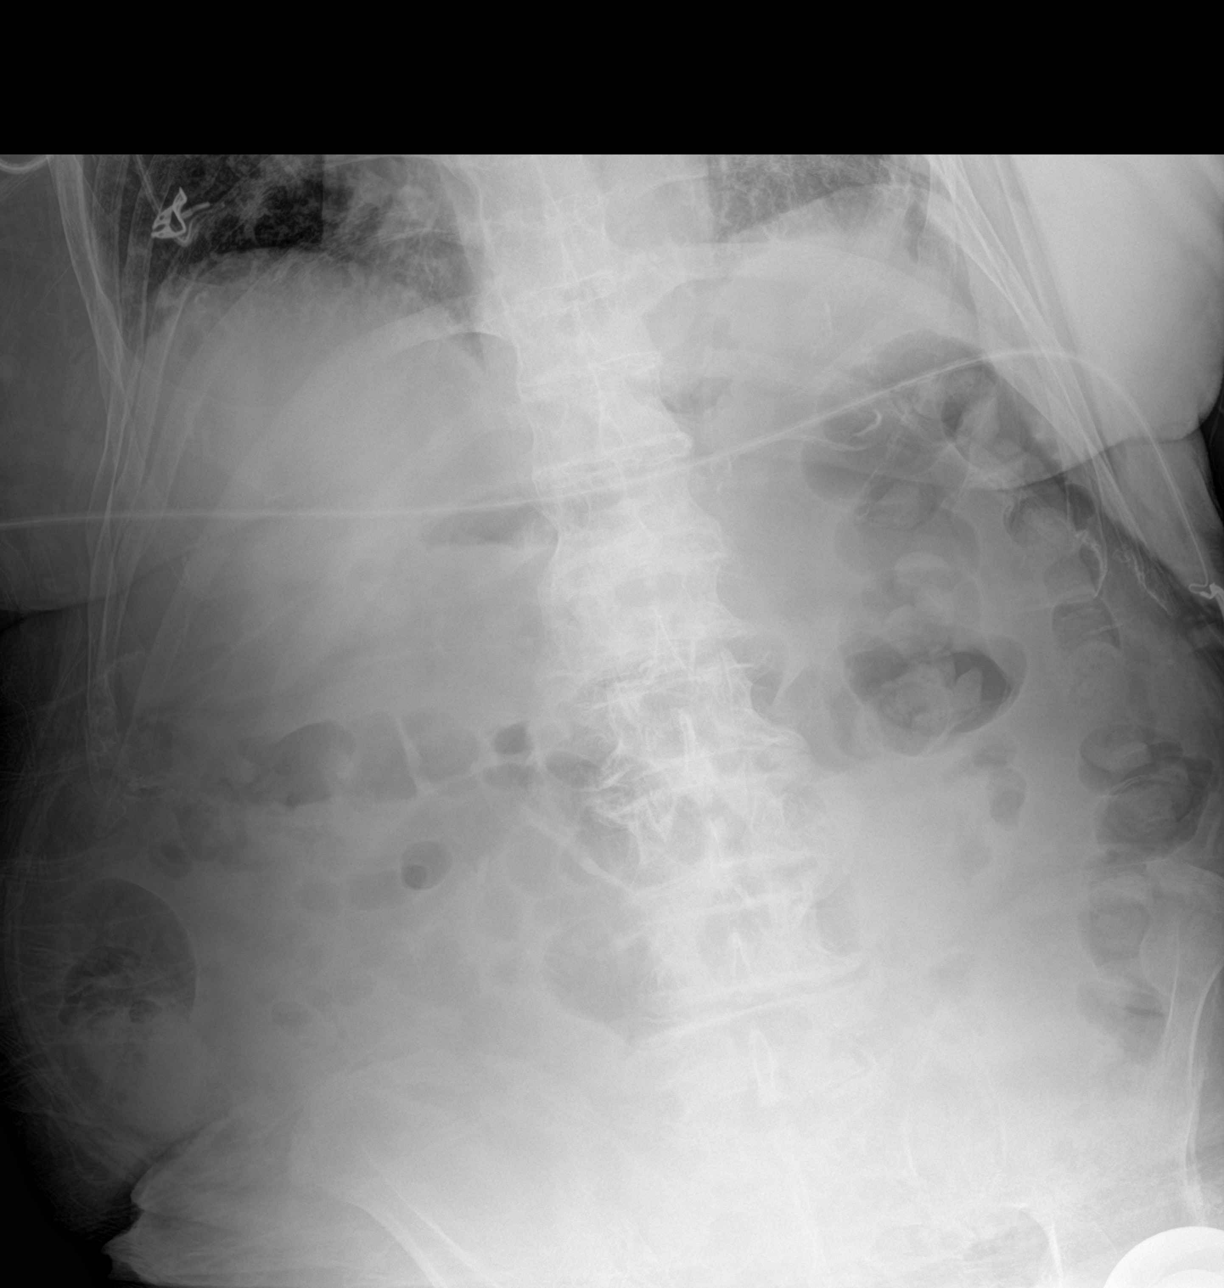
[im 3/4]
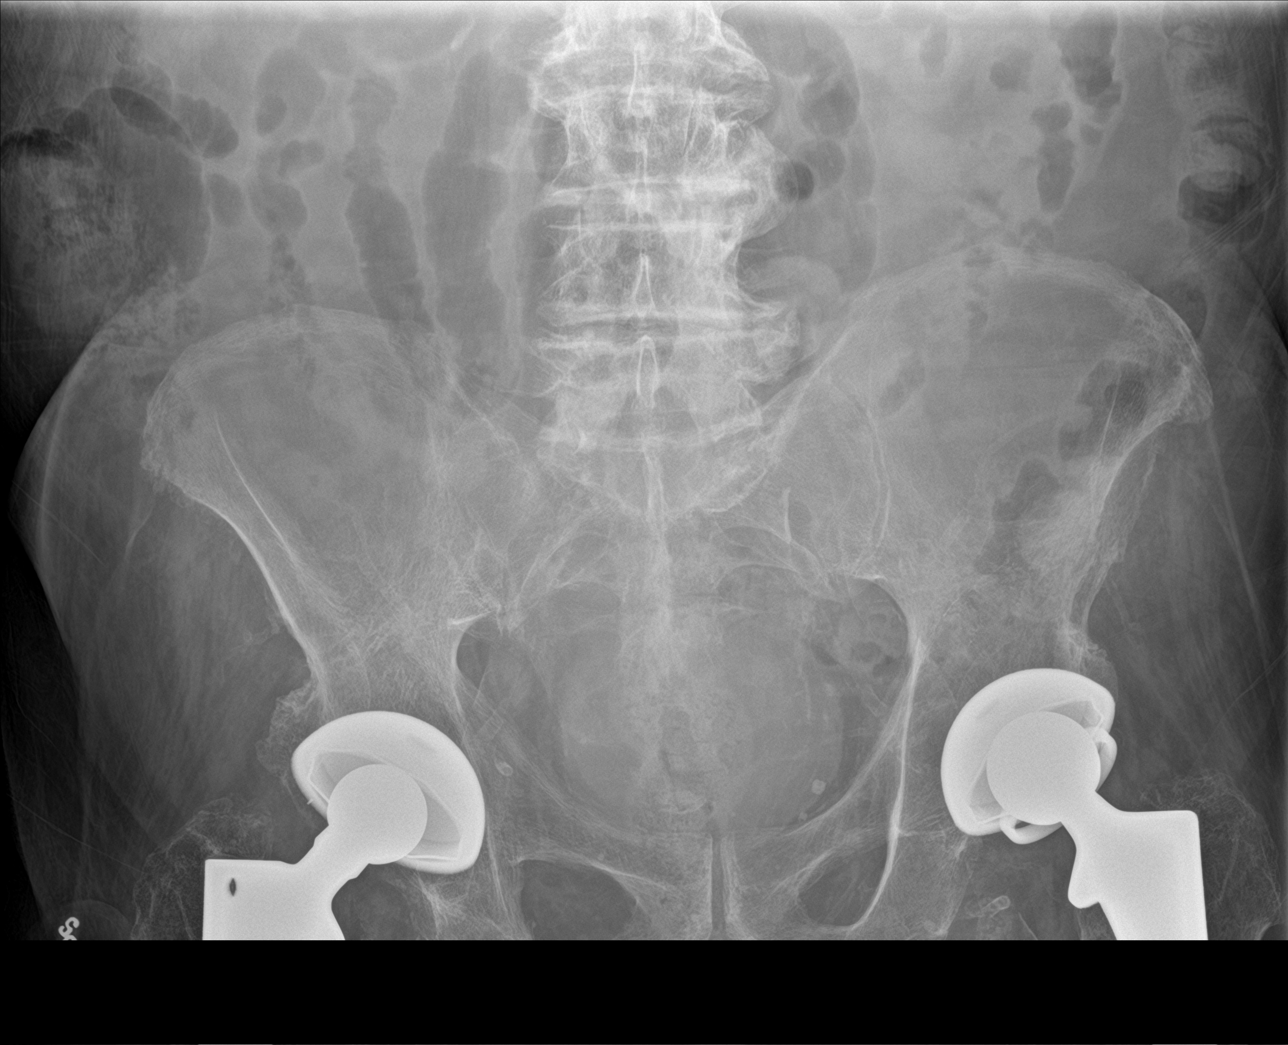
[im 4/4]
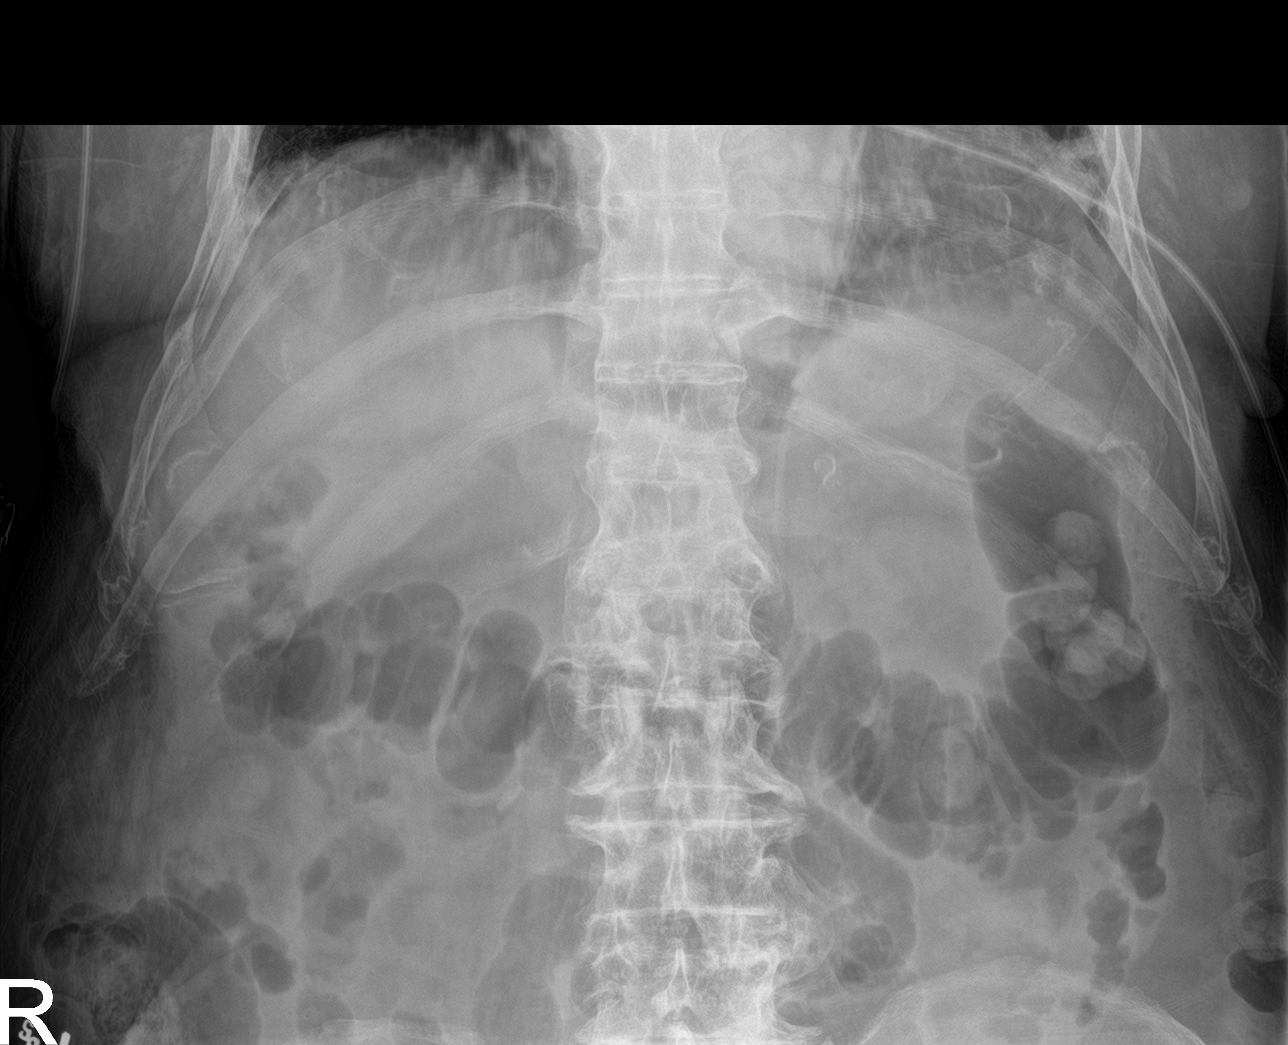

[4 of 4 positions shown; findings below may reference images not displayed]

FINDINGS: There is no evidence of dilated bowel loops or free intraperitoneal
air. No radiopaque calculi or other significant radiographic
abnormality is seen. Bilateral hip replacements are identified.
Degenerative joint changes of the spine are noted. The heart size is
enlarged. The aorta is tortuous. Mild central pulmonary vascular
congestion is identified.
IMPRESSION: No bowel obstruction or free air.

Cardiomegaly with mild central pulmonary vascular congestion.
